# Patient Record
Sex: Female | Born: 1950 | Race: White | Hispanic: No | Smoking: Former smoker
Health system: Southern US, Community
[De-identification: ages and names within clinical notes are randomized; demographics above are authoritative.]

## PROBLEM LIST (undated history)

## (undated) DIAGNOSIS — I509 Heart failure, unspecified: Secondary | ICD-10-CM

## (undated) DIAGNOSIS — R0602 Shortness of breath: Secondary | ICD-10-CM

## (undated) DIAGNOSIS — D649 Anemia, unspecified: Secondary | ICD-10-CM

## (undated) DIAGNOSIS — I209 Angina pectoris, unspecified: Secondary | ICD-10-CM

## (undated) DIAGNOSIS — F329 Major depressive disorder, single episode, unspecified: Secondary | ICD-10-CM

## (undated) DIAGNOSIS — Z8719 Personal history of other diseases of the digestive system: Secondary | ICD-10-CM

## (undated) DIAGNOSIS — K219 Gastro-esophageal reflux disease without esophagitis: Secondary | ICD-10-CM

## (undated) DIAGNOSIS — R609 Edema, unspecified: Secondary | ICD-10-CM

## (undated) DIAGNOSIS — R42 Dizziness and giddiness: Secondary | ICD-10-CM

## (undated) DIAGNOSIS — M1990 Unspecified osteoarthritis, unspecified site: Secondary | ICD-10-CM

## (undated) DIAGNOSIS — J189 Pneumonia, unspecified organism: Secondary | ICD-10-CM

## (undated) DIAGNOSIS — F32A Depression, unspecified: Secondary | ICD-10-CM

## (undated) DIAGNOSIS — J449 Chronic obstructive pulmonary disease, unspecified: Secondary | ICD-10-CM

## (undated) DIAGNOSIS — H4450 Unspecified degenerated conditions of globe: Secondary | ICD-10-CM

## (undated) DIAGNOSIS — F419 Anxiety disorder, unspecified: Secondary | ICD-10-CM

## (undated) DIAGNOSIS — N179 Acute kidney failure, unspecified: Secondary | ICD-10-CM

## (undated) DIAGNOSIS — I10 Essential (primary) hypertension: Secondary | ICD-10-CM

## (undated) HISTORY — PX: LAPAROSCOPIC CHOLECYSTECTOMY: SUR755

## (undated) HISTORY — PX: PATELLA FRACTURE SURGERY: SHX735

## (undated) HISTORY — PX: FRACTURE SURGERY: SHX138

## (undated) HISTORY — PX: TUBAL LIGATION: SHX77

---

## 1998-04-13 ENCOUNTER — Emergency Department (HOSPITAL_COMMUNITY): Admission: EM | Admit: 1998-04-13 | Discharge: 1998-04-13 | Payer: Self-pay | Admitting: Emergency Medicine

## 1998-11-28 ENCOUNTER — Encounter: Payer: Self-pay | Admitting: *Deleted

## 1998-11-28 ENCOUNTER — Encounter: Payer: Self-pay | Admitting: General Surgery

## 1998-11-28 ENCOUNTER — Inpatient Hospital Stay (HOSPITAL_COMMUNITY): Admission: EM | Admit: 1998-11-28 | Discharge: 1998-11-30 | Payer: Self-pay | Admitting: Emergency Medicine

## 2001-06-24 ENCOUNTER — Emergency Department (HOSPITAL_COMMUNITY): Admission: EM | Admit: 2001-06-24 | Discharge: 2001-06-24 | Payer: Self-pay | Admitting: Emergency Medicine

## 2003-01-15 ENCOUNTER — Ambulatory Visit (HOSPITAL_BASED_OUTPATIENT_CLINIC_OR_DEPARTMENT_OTHER): Admission: RE | Admit: 2003-01-15 | Discharge: 2003-01-15 | Payer: Self-pay | Admitting: Orthopedic Surgery

## 2003-01-15 ENCOUNTER — Ambulatory Visit (HOSPITAL_COMMUNITY): Admission: RE | Admit: 2003-01-15 | Discharge: 2003-01-15 | Payer: Self-pay | Admitting: Orthopedic Surgery

## 2003-06-09 ENCOUNTER — Emergency Department (HOSPITAL_COMMUNITY): Admission: EM | Admit: 2003-06-09 | Discharge: 2003-06-09 | Payer: Self-pay

## 2007-06-19 ENCOUNTER — Inpatient Hospital Stay (HOSPITAL_COMMUNITY): Admission: EM | Admit: 2007-06-19 | Discharge: 2007-06-22 | Payer: Self-pay | Admitting: Emergency Medicine

## 2007-06-19 ENCOUNTER — Ambulatory Visit: Payer: Self-pay | Admitting: Internal Medicine

## 2007-06-20 ENCOUNTER — Encounter (INDEPENDENT_AMBULATORY_CARE_PROVIDER_SITE_OTHER): Payer: Self-pay | Admitting: Internal Medicine

## 2007-06-21 ENCOUNTER — Encounter (INDEPENDENT_AMBULATORY_CARE_PROVIDER_SITE_OTHER): Payer: Self-pay | Admitting: Internal Medicine

## 2007-06-21 ENCOUNTER — Ambulatory Visit: Payer: Self-pay | Admitting: Vascular Surgery

## 2010-08-01 NOTE — H&P (Signed)
NAME:  [name] [name], [name] [name]               ACCOUNT NO.:  [id number]   MEDICAL RECORD NO.:  [id number]          PATIENT TYPE:  INP   LOCATION:  1826                         FACILITY:  [facility]   PHYSICIAN:  Eduard Clos, MDDATE OF BIRTH:  12-Feb-1951   DATE OF ADMISSION:  06/19/2007  DATE OF DISCHARGE:                              HISTORY & PHYSICAL   CHIEF COMPLAINT:  Shortness of breath and chest pain.   HISTORY OF PRESENT ILLNESS:  A 60 year old female who was recently  started on Lasix by her primary care physician  following routine fluid  accumulation, personally complaining of shortness of breath and chest  pain.  The patient states that over the last 2-3 weeks, she has  complaining that her legs and extremities have been swelling  increasingly.  She had gone to her primary care physician and she was  prescribed Lasix, the dose of which exactly is not known now, despite  which she is getting increasingly edematous  as well and she decided to  come to the ER.  In addition to the shortness of breath and swelling,  she also noted that she has been getting retrosternal chest pain  pressure-like which lasts for a few seconds, nonradiating, and not  related to any exertion or any particular activity.  The patient denies  any cough or productive sputum.  Denies any dizziness, loss of  consciousness, abdominal pain, nausea, vomiting, diarrhea, fever, or  chills.   PAST MEDICAL HISTORY:  Hypertension.   PAST SURGICAL HISTORY:  Cholecystectomy   Medications  the patient was on Lasix, dose not known.   ALLERGIES:  CODEINE.   SOCIAL HISTORY:  The patient quit smoking a year ago.  Denies any  alcohol or drug abuse.   FAMILY HISTORY:  Significant for coronary artery disease in the family  per patient.   REVIEW OF SYSTEMS:  As present in the history of presenting illness,  nothing else significant.   PHYSICAL EXAMINATION:  GENERAL:  The patient examined at bedside not in  acute  distress.  She states that she feels much better now.  VITAL SIGNS:  Blood pressure 160/67, on admission it was 204/94, pulse  70, temperature 97.2, respirations 24 per minute, O2 saturations 94%.  HEENT:  Anicteric.  No pallor.  CHEST:  Bilateral air entry present.  No rhonchi no crepitation.  HEART:  S1 and S2 heard.  ABDOMEN:  Soft.  Nontender.  Bowel sounds heard.  NEUROLOGY:  Awake, alert and oriented to time, place, and person.  Both  upper limbs 5/5 Peripheral pulses felt.  No edema.   LABORATORY DATA:  Chest x-ray shows CHF pattern with right  tracheobronchial region adenopathy.  Recommending follow-up chest x-ray  or CAT scan.   EKG; normal sinus rhythm with no acute ST T wave changes.  CBC; WBC 5,  hemoglobin 13.6, hematocrit 40, platelets 160, neutrophils 55%.  Basic  metabolic panel; sodium 139, potassium 4.1, chloride 105, glucose 96,  BUN 12, creatinine 0.9.  CK-MB less than 1, troponin less than 0.05, BNP  61.  UA; trace blood, protein, WBC 0-2, bacteria  few.   ASSESSMENT:  1. Decompensated congestive heart failure.  2. Chest pain.  3. Possible urinary tract infection.  4. Obesity.  5. History of cigarette smoking, now quit.   PLAN:  Admit patient to telemetry under [facility].  We will follow  serial cardiac markers.  We will get a CAT scan of the chest with  contrast.  We will place the patient on Lasix IV and get a two-  dimensional echocardiogram.  The patient will need a cardiac consult.  Further recommendations as the patient's condition evolves.      Eduard Clos, MD  Electronically Signed     ANK/MEDQ  D:  06/19/2007  T:  06/19/2007  Job:  [id number]

## 2010-08-01 NOTE — Discharge Summary (Signed)
NAME:  [name] [name], [name] [name]               ACCOUNT NO.:  [id number]   MEDICAL RECORD NO.:  [id number]          PATIENT TYPE:  INP   LOCATION:  3739                         FACILITY:  [facility]   PHYSICIAN:  Elliot Cousin, M.D.    DATE OF BIRTH:  August 30, 1950   DATE OF ADMISSION:  06/19/2007  DATE OF DISCHARGE:  06/22/2007                               DISCHARGE SUMMARY   DISCHARGE DIAGNOSES:  1. Patchy ground-glass opacities bilaterally with bilateral hilar and      mediastinal adenopathy per CT scan of the chest on June 20, 2007.  2. Pulmonary edema versus bronchitic pneumonia.  3. Hypertension.  4. Morbid obesity  5. Hypokalemia.  6. Mild thrombocytopenia.  7. Left leg pain, venous Doppler ruled out deep venous thrombosis.   DISCHARGE MEDICATIONS:  1. Lasix 20 mg b.i.d.  2. Potassium chloride 20 mEq half a tablet b.i.d.  3. Ceftin 500 mg b.i.d. for 7 more days.  4. Azithromycin 250 mg daily for 7 more days.  5. Vicodin 5 mg every 4 hours as needed for pain.  6. Albuterol inhaler 2 puffs every 4-6 hours as needed.  7. Aspirin 81 mg daily.  8. Advair discus 250/50 one puff b.i.d.  9. Multivitamin once daily.   DISCHARGE DISPOSITION:  The patient is being discharged to home in  improved and stable condition.  She was advised to follow up with her  primary care physician in 5-7 days.   CONSULTATIONS:  None.   PROCEDURES PERFORMED:  1. Left lower extremity venous Doppler study.  The results revealed no      evidence of DVT, SVT, or Baker cyst.  2. CT scan of the chest on June 20, 2007.  The results revealed no      embolus identified.  Bilateral ground-glass opacities, most      compatible with edema.  Abnormal bilateral hilar and mediastinal      adenopathy along with some periportal adenopathy.  3. 2D echocardiogram.   HISTORY OF PRESENT ILLNESS:  The patient is a 60 year old woman with a  past medical history significant for hypertension, questionable asthma,  and morbid obesity.   She presented to the emergency department on June 19, 2007 with a chief complaint of shortness of breath and chest  pressure.  When she was evaluated in the emergency department, the chest  x-ray revealed congestive heart failure per the radiologist's  interpretation.  Her EKG revealed normal sinus rhythm with no ST or T-  wave abnormalities.  She was given 80 mg of Lasix IV by the emergency  department physician.  The patient was therefore admitted for further  evaluation and management.   For additional details, please see the dictated history and physical.   HOSPITAL COURSE:  1. BILATERAL GROUND-GLASS OPACITIES AND MEDIASTINAL/HILAR ADENOPATHY.      As indicated above, the patient's EKG revealed a normal sinus      rhythm with no acute ST or T-wave abnormalities.  The chest x-ray      was read as congestive heart failure.  The patient was given 80 mg  of Lasix by the emergency department physician.  She was continued      on diuretic therapy with Lasix 40 mg IV daily and nitroglycerin      paste was added.  The patient was hypertensive on arrival to the      emergency department with a blood pressure of 204/94.  She was      therefore started on clonidine.  For further evaluation, a CT scan      of the chest and a 2D echocardiogram were ordered.  The CT scan of      the chest revealed bilateral opacities with mediastinal and hilar      adenopathy.  The differential diagnoses included edema, alveolitis,      sarcoidosis, or malignancy.  There was also a clinical suspicion      for bronchitic pneumonia and therefore the patient was started on      treatment with Rocephin and azithromycin.  The results of the 2D      echocardiogram revealed an ejection fraction of 60-70%.  There      appeared to be no gross abnormalities of her valves and no obvious      evidence of moderate-to-severe valvular regurgitation or stenosis.      There was no mention of diastolic dysfunction per the       interpretation by Dr. Tenny Craw, however, there is a clinical suspicion      for possible diastolic dysfunction. The patient's BNP was 61 and      her TSH was 3.04.   Over the course of the hospitalization, the patient diuresed briskly.  The Lasix was changed to 20 mg p.o. b.i.d. after 2 days.  Today, she is  significantly symptomatically improved.  She is afebrile and her white  blood cell count is within normal limits at 5.4.  A pulmonary  consultation was not obtained during the hospital course as the patient  became asymptomatic rather quickly.  However, I informed the patient  that she should continue on antibiotic treatment for at least one more  week and she should be reevaluated with a CT scan of the chest in 4-6  weeks.  This will be deferred to her primary care physician, Dr. Purnell Shoemaker.  The patient was strongly advised to discuss the ordering of the followup  CT scan with Dr. Purnell Shoemaker.  In addition, the patient will be discharged on  albuterol inhaler therapy as needed and she was advised to continue  Advair Diskus.  She had been treated with Lasix 20 mg daily prior to the  hospitalization, however, she will be discharged to home on 20 mg  b.i.d..  The patient was informed of this and she voiced understanding.   1. HYPERTENSION.  The patient has a history of hypertension, however,      she apparently had not been treated with an antihypertensive      medication lately.  She did mention that her blood pressure is      usually normal during her doctor's visits.  When she arrived to the      emergency department, her systolic blood pressure was greater than      200.  She was started on clonidine, however, after her blood      pressure fell dramatically, the clonidine was discontinued.  She      was, however, started on lisinopril at 2.5 mg daily in anticipation      that the patient had left ventricular systolic dysfunction.  However, once her blood pressure normalized and there was  no      evidence of LV dysfunction on the echo, the lisinopril was      discontinued.  Another reason the lisinopril was discontinued was      that the patient developed a dry hacking cough after it was      started.  She was advised to have her blood pressure rechecked by      her primary care physician next week and if needed, he could      restart another antihypertensive medication.  The patient voiced      understanding.  As of today, her blood pressure is 116/78.   1. HYPOKALEMIA.  The patient's serum potassium fell to a nadir of 3.3.      This was attributable to the Lasix.  She was repleted with      potassium chloride during hospital course and as of today, her      serum potassium is 4.4.   1. MORBID OBESITY.  The registered dietician was counseled and she      provided the patient with weight management and nutrition      recommendations for weight loss.   1. THROMBOCYTOPENIA.  The patient was started on prophylactic Lovenox.      Her platelet count was 160 at the time of the initial hospital      assessment.  However, it did fall to a nadir of 127.  The Lovenox      was discontinued.  Following the discontinuation of the Lovenox,      her platelet count improved to 145 prior to hospital discharge.   1. LEFT LOWER EXTREMITY CALF PAIN.  The patient complained of left      lower extremity pain when ambulating, although she says that she      has had intermittent pain in her hips and her legs for quite some      time.  Given her morbid obesity, the patient underwent further      evaluation with the left lower extremity venous Doppler study.  The      Doppler study was negative for DVT.      Elliot Cousin, M.D.  Electronically Signed     DF/MEDQ  D:  06/22/2007  T:  06/23/2007  Job:  [id number]   cc:   Lianne Bushy, M.D.

## 2010-08-04 NOTE — Op Note (Signed)
NAME:  [name] [name], [name] [name]                           ACCOUNT NO.:  [id number]   MEDICAL RECORD NO.:  [id number]                   PATIENT TYPE:  AMB   LOCATION:  DSC                                  FACILITY:  [facility]   PHYSICIAN:  Harvie Junior, M.D.                DATE OF BIRTH:  May 29, 1950   DATE OF PROCEDURE:  01/15/2003  DATE OF DISCHARGE:                                 OPERATIVE REPORT   PREOPERATIVE DIAGNOSIS:  Subcutaneous hematoma with bursal inflammation  prepatellar.   POSTOPERATIVE DIAGNOSIS:  Subcutaneous hematoma with bursal inflammation  prepatellar.   PROCEDURE:  1. Debridement of deep hematoma of the anterior leg.  2. Prepatellar bursal excision.   SURGEON:  Harvie Junior, M.D.   ANESTHESIA:  General.   INDICATIONS FOR PROCEDURE:  She is a 60 year old female with a long history  of having had some sort  of  subcutaneous bleed in the right leg. She was  seen in the office where she underwent  aspiration x4. She underwent a small  incision with placement of a drain. None of this seemed to keep the knee  from draining. An MRI was obtained which showed no intraarticular knee  pathology and showed a subcutaneous hematoma collection suspected to be in  the prepatellar bursal area. She is brought to the operating room for  debridement of this area.   DESCRIPTION OF PROCEDURE:  The patient was brought to the operating room and  after adequate anesthesia was obtained with general anesthesia the patient  was placed supine on the operating table. The right leg was then prepped and  draped in the usual sterile fashion. Following this the leg was  exsanguinated and the blood pressure tourniquet was inflated to 350 mmHg.   Following  this a linear incision was made and the subcutaneous tissue was  dissected down to the level of the hematoma which was clearly encountered,  and a large amount of blood tinged fluid came through this area. Following  this the cavity was opened  completely and the walls of the cavity were  debrided and scraped. A rongeur was used. The layers of this area were  debrided fully.   The prepatellar bursa was then identified  and it was noted it also looked  to be involved. A prepatellar bursectomy was undertaken at this point.  Dissection was continued both medially and laterally to make sure we had  gotten the extent of the hematoma.   At this point the pulse lavage irrigator was used to mechanically debride  the area more fully. The tourniquet was let down to make sure there were no  obvious areas of bleeding. Seeing none a 10 mm Blake drain was placed  through an inferior portal and the dead space was closed with subcutaneous  type stitches allowing the Vicryl stitching the skin  to the deep area. Once  this was accomplished  the central area was closed with deep stitches which  sewed the 2 edges to the deep layer.   Following  this the skin was stapled and a sterile compressive dressing was  applied. The Blake drain was sewn in place inferiorly. At this point Ace  wraps were placed over the dressing  and the patient was placed in a knee  immobilizer.   She was taken to the recovery room where she was noted to be in satisfactory  condition.  Estimated blood loss was for the procedure was 50 mL.                                                 Harvie Junior, M.D.    Ranae Plumber  D:  01/15/2003  T:  01/15/2003  Job:  [id number]

## 2010-10-20 ENCOUNTER — Emergency Department (HOSPITAL_COMMUNITY)
Admission: EM | Admit: 2010-10-20 | Discharge: 2010-10-20 | Disposition: A | Discharge status: Home / Self Care | Payer: Medicare Other | Attending: Emergency Medicine | Admitting: Emergency Medicine

## 2010-10-20 ENCOUNTER — Emergency Department (HOSPITAL_COMMUNITY): Payer: Medicare Other

## 2010-10-20 DIAGNOSIS — R109 Unspecified abdominal pain: Secondary | ICD-10-CM | POA: Insufficient documentation

## 2010-10-20 DIAGNOSIS — S32009A Unspecified fracture of unspecified lumbar vertebra, initial encounter for closed fracture: Secondary | ICD-10-CM | POA: Insufficient documentation

## 2010-10-20 DIAGNOSIS — X58XXXA Exposure to other specified factors, initial encounter: Secondary | ICD-10-CM | POA: Insufficient documentation

## 2010-10-20 LAB — COMPREHENSIVE METABOLIC PANEL
AST: 20 U/L (ref 0–37)
Albumin: 3 g/dL — ABNORMAL LOW (ref 3.5–5.2)
Alkaline Phosphatase: 81 U/L (ref 39–117)
BUN: 19 mg/dL (ref 6–23)
Chloride: 100 mEq/L (ref 96–112)
Potassium: 4.2 mEq/L (ref 3.5–5.1)
Sodium: 132 mEq/L — ABNORMAL LOW (ref 135–145)
Total Bilirubin: 0.5 mg/dL (ref 0.3–1.2)
Total Protein: 7.9 g/dL (ref 6.0–8.3)

## 2010-10-20 LAB — DIFFERENTIAL
Basophils Absolute: 0 10*3/uL (ref 0.0–0.1)
Basophils Relative: 1 % (ref 0–1)
Eosinophils Absolute: 0.1 10*3/uL (ref 0.0–0.7)
Eosinophils Relative: 2 % (ref 0–5)
Lymphocytes Relative: 25 % (ref 12–46)
Monocytes Absolute: 0.6 10*3/uL (ref 0.1–1.0)

## 2010-10-20 LAB — CBC
MCHC: 34.6 g/dL (ref 30.0–36.0)
Platelets: 133 10*3/uL — ABNORMAL LOW (ref 150–400)
RDW: 13.1 % (ref 11.5–15.5)
WBC: 4.6 10*3/uL (ref 4.0–10.5)

## 2010-10-20 LAB — URINALYSIS, ROUTINE W REFLEX MICROSCOPIC
Bilirubin Urine: NEGATIVE
Glucose, UA: NEGATIVE mg/dL
Leukocytes, UA: NEGATIVE
Nitrite: NEGATIVE
Specific Gravity, Urine: 1.017 (ref 1.005–1.030)
pH: 5.5 (ref 5.0–8.0)

## 2010-10-20 LAB — LIPASE, BLOOD: Lipase: 16 U/L (ref 11–59)

## 2010-10-20 MED ORDER — IOHEXOL 300 MG/ML  SOLN
100.0000 mL | Freq: Once | INTRAMUSCULAR | Status: AC | PRN
  Administered 2010-10-20: 100 mL via INTRAVENOUS

## 2010-12-12 LAB — BASIC METABOLIC PANEL
BUN: 16
CO2: 29
Chloride: 100
Creatinine, Ser: 1.02
GFR calc non Af Amer: >60
GFR calc non Af Amer: >60
Glucose, Bld: 108 — ABNORMAL HIGH
Glucose, Bld: 94
Potassium: 3.3 — ABNORMAL LOW
Potassium: 4.4
Sodium: 134 — ABNORMAL LOW
Sodium: 137

## 2010-12-12 LAB — POCT I-STAT, CHEM 8
BUN: 12
Chloride: 105
Creatinine, Ser: 0.9
Glucose, Bld: 96
HCT: 40
Potassium: 4.1

## 2010-12-12 LAB — URINALYSIS, ROUTINE W REFLEX MICROSCOPIC
Bilirubin Urine: NEGATIVE
Glucose, UA: NEGATIVE
Ketones, ur: NEGATIVE
Nitrite: NEGATIVE
Protein, ur: 30 — AB
Specific Gravity, Urine: 1.021
Urobilinogen, UA: 1
pH: 5.5

## 2010-12-12 LAB — POCT CARDIAC MARKERS
CKMB, poc: <1 — ABNORMAL LOW
CKMB, poc: <1 — ABNORMAL LOW
Myoglobin, poc: 49.2
Operator id: 234501
Troponin i, poc: <0.05

## 2010-12-12 LAB — URINALYSIS, MICROSCOPIC ONLY
Glucose, UA: NEGATIVE
Protein, ur: NEGATIVE
Specific Gravity, Urine: 1.014
pH: 5

## 2010-12-12 LAB — LIPID PANEL
LDL Cholesterol: 102 — ABNORMAL HIGH
Triglycerides: 78
VLDL: 16

## 2010-12-12 LAB — DIFFERENTIAL
Lymphocytes Relative: 23
Lymphs Abs: 1.1
Monocytes Absolute: 0.5
Monocytes Relative: 10
Neutro Abs: 3.2

## 2010-12-12 LAB — CBC
HCT: 37.9
HCT: 38
Hemoglobin: 13
Hemoglobin: 13.4
MCHC: 34.3
MCV: 93
Platelets: 127 — ABNORMAL LOW
RBC: 4.16
RDW: 13.4
RDW: 13.4

## 2010-12-12 LAB — URINE CULTURE: Culture: NO GROWTH

## 2010-12-12 LAB — TSH: TSH: 3.047

## 2010-12-12 LAB — URINE MICROSCOPIC-ADD ON

## 2010-12-12 LAB — CK TOTAL AND CKMB (NOT AT ARMC): CK, MB: 0.5

## 2011-03-29 DIAGNOSIS — M545 Low back pain: Secondary | ICD-10-CM | POA: Diagnosis not present

## 2011-03-29 DIAGNOSIS — G4700 Insomnia, unspecified: Secondary | ICD-10-CM | POA: Diagnosis not present

## 2011-03-29 DIAGNOSIS — R112 Nausea with vomiting, unspecified: Secondary | ICD-10-CM | POA: Diagnosis not present

## 2011-04-16 DIAGNOSIS — H109 Unspecified conjunctivitis: Secondary | ICD-10-CM | POA: Diagnosis not present

## 2011-04-16 DIAGNOSIS — M79609 Pain in unspecified limb: Secondary | ICD-10-CM | POA: Diagnosis not present

## 2011-04-16 DIAGNOSIS — G4700 Insomnia, unspecified: Secondary | ICD-10-CM | POA: Diagnosis not present

## 2011-05-14 DIAGNOSIS — J189 Pneumonia, unspecified organism: Secondary | ICD-10-CM | POA: Diagnosis not present

## 2011-05-14 DIAGNOSIS — M545 Low back pain: Secondary | ICD-10-CM | POA: Diagnosis not present

## 2011-05-14 DIAGNOSIS — E7800 Pure hypercholesterolemia, unspecified: Secondary | ICD-10-CM | POA: Diagnosis not present

## 2011-05-14 DIAGNOSIS — E039 Hypothyroidism, unspecified: Secondary | ICD-10-CM | POA: Diagnosis not present

## 2011-05-14 DIAGNOSIS — R5381 Other malaise: Secondary | ICD-10-CM | POA: Diagnosis not present

## 2011-05-14 DIAGNOSIS — Z5181 Encounter for therapeutic drug level monitoring: Secondary | ICD-10-CM | POA: Diagnosis not present

## 2011-05-14 DIAGNOSIS — R112 Nausea with vomiting, unspecified: Secondary | ICD-10-CM | POA: Diagnosis not present

## 2011-05-14 DIAGNOSIS — Z79899 Other long term (current) drug therapy: Secondary | ICD-10-CM | POA: Diagnosis not present

## 2011-05-21 DIAGNOSIS — J029 Acute pharyngitis, unspecified: Secondary | ICD-10-CM | POA: Diagnosis not present

## 2011-05-21 DIAGNOSIS — G4700 Insomnia, unspecified: Secondary | ICD-10-CM | POA: Diagnosis not present

## 2011-05-21 DIAGNOSIS — B379 Candidiasis, unspecified: Secondary | ICD-10-CM | POA: Diagnosis not present

## 2011-06-13 DIAGNOSIS — F411 Generalized anxiety disorder: Secondary | ICD-10-CM | POA: Diagnosis not present

## 2011-06-13 DIAGNOSIS — G4700 Insomnia, unspecified: Secondary | ICD-10-CM | POA: Diagnosis not present

## 2011-06-13 DIAGNOSIS — J449 Chronic obstructive pulmonary disease, unspecified: Secondary | ICD-10-CM | POA: Diagnosis not present

## 2011-06-14 DIAGNOSIS — G4700 Insomnia, unspecified: Secondary | ICD-10-CM | POA: Diagnosis not present

## 2011-06-14 DIAGNOSIS — G4730 Sleep apnea, unspecified: Secondary | ICD-10-CM | POA: Diagnosis not present

## 2011-06-20 DIAGNOSIS — R5383 Other fatigue: Secondary | ICD-10-CM | POA: Diagnosis not present

## 2011-06-20 DIAGNOSIS — M545 Low back pain: Secondary | ICD-10-CM | POA: Diagnosis not present

## 2011-06-20 DIAGNOSIS — R112 Nausea with vomiting, unspecified: Secondary | ICD-10-CM | POA: Diagnosis not present

## 2011-06-20 DIAGNOSIS — R5381 Other malaise: Secondary | ICD-10-CM | POA: Diagnosis not present

## 2011-07-12 DIAGNOSIS — G4700 Insomnia, unspecified: Secondary | ICD-10-CM | POA: Diagnosis not present

## 2011-07-12 DIAGNOSIS — E7800 Pure hypercholesterolemia, unspecified: Secondary | ICD-10-CM | POA: Diagnosis not present

## 2011-07-12 DIAGNOSIS — I509 Heart failure, unspecified: Secondary | ICD-10-CM | POA: Diagnosis not present

## 2011-07-12 DIAGNOSIS — R609 Edema, unspecified: Secondary | ICD-10-CM | POA: Diagnosis not present

## 2011-07-16 DIAGNOSIS — N189 Chronic kidney disease, unspecified: Secondary | ICD-10-CM | POA: Diagnosis not present

## 2011-07-16 DIAGNOSIS — R609 Edema, unspecified: Secondary | ICD-10-CM | POA: Diagnosis not present

## 2011-07-16 DIAGNOSIS — G4700 Insomnia, unspecified: Secondary | ICD-10-CM | POA: Diagnosis not present

## 2011-08-01 DIAGNOSIS — I10 Essential (primary) hypertension: Secondary | ICD-10-CM | POA: Diagnosis not present

## 2011-08-01 DIAGNOSIS — R0602 Shortness of breath: Secondary | ICD-10-CM | POA: Diagnosis not present

## 2011-08-01 DIAGNOSIS — K219 Gastro-esophageal reflux disease without esophagitis: Secondary | ICD-10-CM | POA: Diagnosis not present

## 2011-08-01 DIAGNOSIS — I509 Heart failure, unspecified: Secondary | ICD-10-CM | POA: Diagnosis not present

## 2011-08-02 DIAGNOSIS — I501 Left ventricular failure: Secondary | ICD-10-CM | POA: Diagnosis not present

## 2011-08-15 ENCOUNTER — Other Ambulatory Visit: Payer: Self-pay | Admitting: Nephrology

## 2011-08-15 DIAGNOSIS — N189 Chronic kidney disease, unspecified: Secondary | ICD-10-CM

## 2011-08-16 ENCOUNTER — Inpatient Hospital Stay (HOSPITAL_COMMUNITY)
Admission: EM | Admit: 2011-08-16 | Discharge: 2011-08-21 | DRG: 683 | Disposition: A | Discharge status: Home / Self Care | Payer: Medicare Other | Attending: Family Medicine | Admitting: Family Medicine

## 2011-08-16 ENCOUNTER — Ambulatory Visit
Admission: RE | Admit: 2011-08-16 | Discharge: 2011-08-16 | Disposition: A | Discharge status: Home / Self Care | Payer: Medicare Other | Source: Ambulatory Visit | Attending: Nephrology | Admitting: Nephrology

## 2011-08-16 ENCOUNTER — Encounter (HOSPITAL_COMMUNITY): Payer: Self-pay

## 2011-08-16 ENCOUNTER — Emergency Department (HOSPITAL_COMMUNITY): Payer: Medicare Other

## 2011-08-16 DIAGNOSIS — N183 Chronic kidney disease, stage 3 unspecified: Secondary | ICD-10-CM | POA: Diagnosis not present

## 2011-08-16 DIAGNOSIS — D649 Anemia, unspecified: Secondary | ICD-10-CM | POA: Diagnosis present

## 2011-08-16 DIAGNOSIS — R82998 Other abnormal findings in urine: Secondary | ICD-10-CM | POA: Diagnosis not present

## 2011-08-16 DIAGNOSIS — J811 Chronic pulmonary edema: Secondary | ICD-10-CM | POA: Diagnosis not present

## 2011-08-16 DIAGNOSIS — I129 Hypertensive chronic kidney disease with stage 1 through stage 4 chronic kidney disease, or unspecified chronic kidney disease: Secondary | ICD-10-CM | POA: Diagnosis present

## 2011-08-16 DIAGNOSIS — M79609 Pain in unspecified limb: Secondary | ICD-10-CM | POA: Diagnosis not present

## 2011-08-16 DIAGNOSIS — B3781 Candidal esophagitis: Secondary | ICD-10-CM | POA: Diagnosis not present

## 2011-08-16 DIAGNOSIS — R0902 Hypoxemia: Secondary | ICD-10-CM | POA: Diagnosis not present

## 2011-08-16 DIAGNOSIS — Z79899 Other long term (current) drug therapy: Secondary | ICD-10-CM | POA: Diagnosis not present

## 2011-08-16 DIAGNOSIS — N179 Acute kidney failure, unspecified: Principal | ICD-10-CM | POA: Diagnosis present

## 2011-08-16 DIAGNOSIS — J411 Mucopurulent chronic bronchitis: Secondary | ICD-10-CM | POA: Diagnosis not present

## 2011-08-16 DIAGNOSIS — J9601 Acute respiratory failure with hypoxia: Secondary | ICD-10-CM

## 2011-08-16 DIAGNOSIS — E875 Hyperkalemia: Secondary | ICD-10-CM | POA: Diagnosis present

## 2011-08-16 DIAGNOSIS — D696 Thrombocytopenia, unspecified: Secondary | ICD-10-CM | POA: Diagnosis present

## 2011-08-16 DIAGNOSIS — J8410 Pulmonary fibrosis, unspecified: Secondary | ICD-10-CM | POA: Diagnosis present

## 2011-08-16 DIAGNOSIS — R1310 Dysphagia, unspecified: Secondary | ICD-10-CM | POA: Diagnosis present

## 2011-08-16 DIAGNOSIS — J440 Chronic obstructive pulmonary disease with acute lower respiratory infection: Secondary | ICD-10-CM | POA: Diagnosis not present

## 2011-08-16 DIAGNOSIS — Z87891 Personal history of nicotine dependence: Secondary | ICD-10-CM | POA: Diagnosis not present

## 2011-08-16 DIAGNOSIS — R9389 Abnormal findings on diagnostic imaging of other specified body structures: Secondary | ICD-10-CM | POA: Diagnosis not present

## 2011-08-16 DIAGNOSIS — J84113 Idiopathic non-specific interstitial pneumonitis: Secondary | ICD-10-CM | POA: Diagnosis not present

## 2011-08-16 DIAGNOSIS — J984 Other disorders of lung: Secondary | ICD-10-CM | POA: Diagnosis not present

## 2011-08-16 DIAGNOSIS — J9600 Acute respiratory failure, unspecified whether with hypoxia or hypercapnia: Secondary | ICD-10-CM | POA: Diagnosis not present

## 2011-08-16 DIAGNOSIS — R0602 Shortness of breath: Secondary | ICD-10-CM | POA: Diagnosis not present

## 2011-08-16 DIAGNOSIS — R0989 Other specified symptoms and signs involving the circulatory and respiratory systems: Secondary | ICD-10-CM | POA: Diagnosis not present

## 2011-08-16 DIAGNOSIS — R918 Other nonspecific abnormal finding of lung field: Secondary | ICD-10-CM | POA: Diagnosis not present

## 2011-08-16 DIAGNOSIS — R634 Abnormal weight loss: Secondary | ICD-10-CM | POA: Diagnosis present

## 2011-08-16 DIAGNOSIS — F411 Generalized anxiety disorder: Secondary | ICD-10-CM | POA: Diagnosis not present

## 2011-08-16 DIAGNOSIS — R5381 Other malaise: Secondary | ICD-10-CM | POA: Diagnosis not present

## 2011-08-16 DIAGNOSIS — N189 Chronic kidney disease, unspecified: Secondary | ICD-10-CM

## 2011-08-16 DIAGNOSIS — I10 Essential (primary) hypertension: Secondary | ICD-10-CM | POA: Diagnosis not present

## 2011-08-16 DIAGNOSIS — J209 Acute bronchitis, unspecified: Secondary | ICD-10-CM | POA: Diagnosis not present

## 2011-08-16 DIAGNOSIS — E440 Moderate protein-calorie malnutrition: Secondary | ICD-10-CM | POA: Diagnosis present

## 2011-08-16 DIAGNOSIS — R5383 Other fatigue: Secondary | ICD-10-CM | POA: Diagnosis not present

## 2011-08-16 DIAGNOSIS — R748 Abnormal levels of other serum enzymes: Secondary | ICD-10-CM | POA: Diagnosis not present

## 2011-08-16 DIAGNOSIS — R599 Enlarged lymph nodes, unspecified: Secondary | ICD-10-CM | POA: Diagnosis not present

## 2011-08-16 DIAGNOSIS — I359 Nonrheumatic aortic valve disorder, unspecified: Secondary | ICD-10-CM | POA: Diagnosis not present

## 2011-08-16 DIAGNOSIS — R05 Cough: Secondary | ICD-10-CM | POA: Diagnosis not present

## 2011-08-16 HISTORY — DX: Chronic obstructive pulmonary disease, unspecified: J44.9

## 2011-08-16 HISTORY — DX: Unspecified degenerated conditions of globe: H44.50

## 2011-08-16 HISTORY — DX: Acute kidney failure, unspecified: N17.9

## 2011-08-16 HISTORY — DX: Shortness of breath: R06.02

## 2011-08-16 HISTORY — DX: Edema, unspecified: R60.9

## 2011-08-16 HISTORY — DX: Angina pectoris, unspecified: I20.9

## 2011-08-16 HISTORY — DX: Dizziness and giddiness: R42

## 2011-08-16 HISTORY — DX: Anxiety disorder, unspecified: F41.9

## 2011-08-16 LAB — CBC
Platelets: 124 10*3/uL — ABNORMAL LOW (ref 150–400)
RDW: 13.5 % (ref 11.5–15.5)
WBC: 4.4 10*3/uL (ref 4.0–10.5)

## 2011-08-16 LAB — COMPREHENSIVE METABOLIC PANEL
ALT: 23 U/L (ref 0–35)
AST: 26 U/L (ref 0–37)
Albumin: 3 g/dL — ABNORMAL LOW (ref 3.5–5.2)
CO2: 22 mEq/L (ref 19–32)
Calcium: 9 mg/dL (ref 8.4–10.5)
Chloride: 101 mEq/L (ref 96–112)
GFR calc non Af Amer: 9 mL/min — ABNORMAL LOW (ref >90)
Sodium: 135 mEq/L (ref 135–145)
Total Bilirubin: 0.4 mg/dL (ref 0.3–1.2)

## 2011-08-16 LAB — DIFFERENTIAL
Basophils Absolute: 0 10*3/uL (ref 0.0–0.1)
Basophils Relative: 1 % (ref 0–1)
Lymphocytes Relative: 24 % (ref 12–46)
Neutro Abs: 2.5 10*3/uL (ref 1.7–7.7)
Neutrophils Relative %: 58 % (ref 43–77)

## 2011-08-16 LAB — URINALYSIS, ROUTINE W REFLEX MICROSCOPIC
Bilirubin Urine: NEGATIVE
Glucose, UA: NEGATIVE mg/dL
Glucose, UA: NEGATIVE mg/dL
Hgb urine dipstick: NEGATIVE
Hgb urine dipstick: NEGATIVE
Specific Gravity, Urine: 1.017 (ref 1.005–1.030)
Specific Gravity, Urine: 1.013 (ref 1.005–1.030)
Urobilinogen, UA: 0.2 mg/dL (ref 0.0–1.0)
Urobilinogen, UA: 0.2 mg/dL (ref 0.0–1.0)
pH: 5 (ref 5.0–8.0)

## 2011-08-16 LAB — PROTIME-INR
INR: 1.01 (ref 0.00–1.49)
Prothrombin Time: 13.5 seconds (ref 11.6–15.2)

## 2011-08-16 LAB — URINE MICROSCOPIC-ADD ON

## 2011-08-16 LAB — MAGNESIUM: Magnesium: 2.1 mg/dL (ref 1.5–2.5)

## 2011-08-16 MED ORDER — SENNA 8.6 MG PO TABS
1.0000 | ORAL_TABLET | Freq: Every day | ORAL | Status: DC | PRN
  Filled 2011-08-16: qty 1

## 2011-08-16 MED ORDER — PROMETHAZINE HCL 25 MG PO TABS: 25.0000 mg | ORAL_TABLET | Freq: Four times a day (QID) | ORAL | Status: DC | PRN

## 2011-08-16 MED ORDER — ONDANSETRON 8 MG PO TBDP
8.0000 mg | ORAL_TABLET | Freq: Three times a day (TID) | ORAL | Status: DC | PRN
  Filled 2011-08-16: qty 1

## 2011-08-16 MED ORDER — HYDROCODONE-ACETAMINOPHEN 10-325 MG PO TABS: 1.0000 | ORAL_TABLET | Freq: Four times a day (QID) | ORAL | Status: DC | PRN

## 2011-08-16 MED ORDER — SODIUM CHLORIDE 0.9 % IJ SOLN
3.0000 mL | Freq: Two times a day (BID) | INTRAMUSCULAR | Status: DC
  Administered 2011-08-16 – 2011-08-21 (×9): 3 mL via INTRAVENOUS

## 2011-08-16 MED ORDER — POLYETHYLENE GLYCOL 3350 17 G PO PACK
17.0000 g | PACK | Freq: Every day | ORAL | Status: DC | PRN
  Filled 2011-08-16: qty 1

## 2011-08-16 MED ORDER — ACETAMINOPHEN 325 MG PO TABS: 650.0000 mg | ORAL_TABLET | Freq: Four times a day (QID) | ORAL | Status: DC | PRN

## 2011-08-16 MED ORDER — SODIUM POLYSTYRENE SULFONATE 15 GM/60ML PO SUSP
15.0000 g | Freq: Once | ORAL | Status: AC
  Administered 2011-08-16: 15 g via ORAL
  Filled 2011-08-16: qty 60

## 2011-08-16 MED ORDER — ACETAMINOPHEN 650 MG RE SUPP: 650.0000 mg | Freq: Four times a day (QID) | RECTAL | Status: DC | PRN

## 2011-08-16 MED ORDER — CALCIUM CARBONATE ANTACID 500 MG PO CHEW
2.0000 | CHEWABLE_TABLET | Freq: Three times a day (TID) | ORAL | Status: DC | PRN
  Administered 2011-08-16 – 2011-08-17 (×3): 400 mg via ORAL
  Filled 2011-08-16 (×3): qty 2

## 2011-08-16 MED ORDER — ASPIRIN EC 81 MG PO TBEC
81.0000 mg | DELAYED_RELEASE_TABLET | Freq: Every day | ORAL | Status: DC
  Administered 2011-08-17 – 2011-08-21 (×5): 81 mg via ORAL
  Filled 2011-08-16 (×5): qty 1

## 2011-08-16 MED ORDER — FLUOXETINE HCL 20 MG PO CAPS
20.0000 mg | ORAL_CAPSULE | Freq: Every day | ORAL | Status: DC
  Administered 2011-08-17 – 2011-08-21 (×5): 20 mg via ORAL
  Filled 2011-08-16 (×5): qty 1

## 2011-08-16 MED ORDER — ALBUTEROL SULFATE HFA 108 (90 BASE) MCG/ACT IN AERS
2.0000 | INHALATION_SPRAY | Freq: Four times a day (QID) | RESPIRATORY_TRACT | Status: DC | PRN
  Filled 2011-08-16: qty 6.7

## 2011-08-16 NOTE — ED Notes (Signed)
Dr. Briant Cedar at bedside explaining to patient plan for treatment. Patient is to be admitted and repeat blood work in the am per MD.  Awaiting admission assignment.

## 2011-08-16 NOTE — H&P (Signed)
Family Medicine Teaching [facility] Admission History and Physical  Patient name: [name] [name] Medical record number: [id number] Date of birth: March 09, 1951 Age: 61 y.o. Gender: female  Primary Care Provider: Dr. Caroline More  Chief Complaint: Elevated Cr, weakness, and no urine  History of Present Illness: [name] [name] is a 61 y.o. year old female presenting with 2-46month h/o increasing weakenss and fatigue, minimal urinary output for 2 days w/ elevated Cr  General weakness: Pt reports increasing general weakness and fatigue over the past 2-61months. Pt unable to stand for greater than 5 min w/o having feeling of complete weakness "wash" over her body. Pt reports having a night or two of poor sleep followed by several days of needing to sleep "a lot."  And sleeping more in general over past 2-72mo. Reports feeling very cold and hair loss over this time period as well. Also reports being constipated, though this is not a new problem for the pt. Pt w/ some near falls that were due to weakness, no loss of consciousness. Denies Palpitations, Ha, nausea, vomiting, diarrhea, syncope, change in vision, SOB, CP. Pt w/ reported 90lb wt loss over the past 18 mo that was unintentional. Pt reports having very little taste for food for several years, though her sense of smell has remained sharp.   Elevated Cr, low Urine: Pt reports being followed regularly by [facility] but unsure of why. Pt reports being called today by renal docs who said that her Cr has increased from 3 two days ago to 4.75 today so she needed to come to the hospital. Pt reports only dribbling out some urine over the past 2 days. Denies any hematuria, dysuria, lower abdominal pain. No previous renal concerns per pt.  Pt reports very little PO over the past 2 days though she drinks 4-5 water bottles daily (approximately 2-2.5L). No recent medicine, lifestyle, dietary changes. No recent illnesses.  Review Of Systems: Per HPI  There  is no problem list on file for this patient.  Past Medical History: Past Medical History  Diagnosis Date  . Anxiety   . COPD (chronic obstructive pulmonary disease)   . Fluid retention     Past Surgical History: Past Surgical History  Procedure Date  . Cholecystectomy   . Knee surgery     Social History: History   Social History  . Marital Status: Married    Spouse Name: N/A    Number of Children: N/A  . Years of Education: N/A   Social History Main Topics  . Smoking status: Never Smoker   . Smokeless tobacco: Current User  . Alcohol Use: No  . Drug Use: No  . Sexually Active:    Other Topics Concern  . None   Social History Narrative  . None    Family History: No family history on file.  Allergies: NKDA  No current facility-administered medications for this encounter.   Current Outpatient Prescriptions  Medication Sig Dispense Refill  . albuterol (PROVENTIL HFA;VENTOLIN HFA) 108 (90 BASE) MCG/ACT inhaler Inhale 2 puffs into the lungs every 6 (six) hours as needed. For shortness of breath      . citalopram (CELEXA) 20 MG tablet Take 20 mg by mouth daily.      Marland Kitchen FLUoxetine (PROZAC) 20 MG capsule Take 20 mg by mouth daily.      . furosemide (LASIX) 40 MG tablet Take 40 mg by mouth daily.      Marland Kitchen HYDROcodone-acetaminophen (NORCO) 10-325 MG per tablet Take 1 tablet by  mouth every 6 (six) hours as needed. For pain      . promethazine (PHENERGAN) 25 MG tablet Take 25 mg by mouth every 6 (six) hours as needed. For nausea         Physical Exam: Filed Vitals:   08/16/11 1419  BP: 121/49  Pulse: 101  Temp: 98.4 F (36.9 C)  Resp: 22   General: alert, cooperative, appears stated age, mild distress and moderately obese HEENT: PERRLA, extra ocular movement intact, sclera clear, anicteric, oropharynx clear, no lesions, neck supple with midline trachea, thyroid without masses and trachea midline Heart: RRR, no m/r/g, No JVD Lungs: CTAB, normal effort Abdomen:  NABS non-painful to palpation. No masses Extremities: 2+ LE edema, 2+ distal pulses Musculoskeletal: Normal ROM Neurology: Normal finger to nose. Strength 2+ and symmetrical in upper and lower extremities. CN grossly intact  Labs and Imaging: CBC     Status: Abnormal   Collection Time   08/16/11  4:30 PM      Component Value Range   WBC 4.4  4.0 - 10.5 (K/uL)   RBC 3.38 (*) 3.87 - 5.11 (MIL/uL)   Hemoglobin 10.6 (*) 12.0 - 15.0 (g/dL)   HCT 16.1 (*) 09.6 - 46.0 (%)   MCV 95.3  78.0 - 100.0 (fL)   MCH 31.4  26.0 - 34.0 (pg)   MCHC 32.9  30.0 - 36.0 (g/dL)   RDW 04.5  40.9 - 81.1 (%)   Platelets 124 (*) 150 - 400 (K/uL)  DIFFERENTIAL     Status: Normal   Collection Time   08/16/11  4:30 PM      Component Value Range   Neutrophils Relative 58  43 - 77 (%)   Neutro Abs 2.5  1.7 - 7.7 (K/uL)   Lymphocytes Relative 24  12 - 46 (%)   Lymphs Abs 1.1  0.7 - 4.0 (K/uL)   Monocytes Relative 12  3 - 12 (%)   Monocytes Absolute 0.5  0.1 - 1.0 (K/uL)   Eosinophils Relative 5  0 - 5 (%)   Eosinophils Absolute 0.2  0.0 - 0.7 (K/uL)   Basophils Relative 1  0 - 1 (%)   Basophils Absolute 0.0  0.0 - 0.1 (K/uL)  COMPREHENSIVE METABOLIC PANEL     Status: Abnormal   Collection Time   08/16/11  4:30 PM      Component Value Range   Sodium 135  135 - 145 (mEq/L)   Potassium 5.2 (*) 3.5 - 5.1 (mEq/L)   Chloride 101  96 - 112 (mEq/L)   CO2 22  19 - 32 (mEq/L)   Glucose, Bld 95  70 - 99 (mg/dL)   BUN 56 (*) 6 - 23 (mg/dL)   Creatinine, Ser 9.14 (*) 0.50 - 1.10 (mg/dL)   Calcium 9.0  8.4 - 78.2 (mg/dL)   Total Protein 7.8  6.0 - 8.3 (g/dL)   Albumin 3.0 (*) 3.5 - 5.2 (g/dL)   AST 26  0 - 37 (U/L)   ALT 23  0 - 35 (U/L)   Alkaline Phosphatase 92  39 - 117 (U/L)   Total Bilirubin 0.4  0.3 - 1.2 (mg/dL)   GFR calc non Af Amer 9 (*) >90 (mL/min)   GFR calc Af Amer 10 (*) >90 (mL/min)  MAGNESIUM     Status: Normal   Collection Time   08/16/11  4:30 PM      Component Value Range   Magnesium 2.1   1.5 - 2.5 (mg/dL)  URINALYSIS,  ROUTINE W REFLEX MICROSCOPIC     Status: Normal   Collection Time   08/16/11  4:57 PM      Component Value Range   Color, Urine YELLOW  YELLOW    APPearance CLEAR  CLEAR    Specific Gravity, Urine 1.013  1.005 - 1.030    pH 5.0  5.0 - 8.0    Glucose, UA NEGATIVE  NEGATIVE (mg/dL)   Hgb urine dipstick NEGATIVE  NEGATIVE    Bilirubin Urine NEGATIVE  NEGATIVE    Ketones, ur NEGATIVE  NEGATIVE (mg/dL)   Protein, ur NEGATIVE  NEGATIVE (mg/dL)   Urobilinogen, UA 0.2  0.0 - 1.0 (mg/dL)   Nitrite NEGATIVE  NEGATIVE    Leukocytes, UA NEGATIVE  NEGATIVE    US renal: IMPRESSION:  No hydronephrosis. The urinary bladder is decompressed and cannot  be evaluated.  EKG: unremarkable  CT Head: Unremarkable cranial CT.  CXR: Fine perihilar air space disease suggests mild pulmonary  edema/interstitial edema versus less likely atypical infection.  Assessment and Plan: [name] [name] is a 61 y.o. year old female w/ PMHx of COPD, tobacco use, and anxiety presenting with hyperkalemia, fatigue/weakness and acute renal failure.   1. ARF: etiology unknown but likely mutlifactorial including prenal causes of hypovolemia and hypotension and intrinsic causes. Per renal, pt was recently stopped on her lisinopril. Renal will be following and are ordering a DSDNA due to previous positive ANA. Renal also considering HUS TTP. This is a possibility as pt meets several criteria (anemia, thrombocytopenia, renal failure, though not febrile and no neurologic changes), though no obvious source of HUS so likely idiopathic. Renal will likely perform biopsy for further diagnoses. - Continue to follow renal input - Strict I/O - No IVF as renal thinks this is more of an intrinsic renal failure - Hold Lasix - Hold Lisinopril - Renal diet - BMET in am - F/u renal lab orders, DS DNA, and likely peripheral smear and Adam TS  2. Fatigue and weakness: Unsure of etiology though thyroid dysfunction  is high on differential. Also considering anemia vs malignancy due to wt loss, h/o tobacco use, and age.  - TSH  3. FEN/GI: No IVF as above. HyperK likely from ARF. - K-aexylate x1 - BMET in AM - renal diet.   4. Psych: Pt w/ h/o depression and on 2 SSRI's. Unsure of regimen choice.  - will try to clarify tomorrow - for now continue only Prozac as this is what nephrology reports she is taking  4. Prophylaxis:  - SCD (no heparin at this time due to concern for TTP)  5. Disposition: Pending clinical improvement and     Signed: MERRELL, DAVID, M.D. Family Medicine Resident PGY-1 [phone] 08/16/2011 7:17 PM  Have seen the patient and discussed her in detail with both nephrology and Dr. Konrad Dolores.  Discussion with renal reveals that she had baseline Cr of 0.87 and was referred to them in March of this year for rise to ~1.4 without obvious cause.  Two days ago patient was seen by them and found to have elevation of cr to the mid 3's.  Repeat today was 4.7 and they instructed the patient to come to the ED for admission.  I agree with the plan as formulated above. Betheny Suchecki MD 08/16/2011, 9:34 PM

## 2011-08-16 NOTE — ED Notes (Signed)
Admitting md at bedside to eval pt 

## 2011-08-16 NOTE — ED Provider Notes (Signed)
History  This chart was scribed for Dione Booze, MD by Bennett Scrape. This patient was seen in room STRE3/STRE3 and the patient's care was started at 4:01PM.  CSN: [id number]  Arrival date & time 08/16/11  1357   First MD Initiated Contact with Patient 08/16/11 1601      Chief Complaint  Patient presents with  . Weakness    The history is provided by the patient. No language interpreter was used.    [name] [name] is a 61 y.o. female with a h/o anxiety, COPD and fluid retention who presents to the Emergency Department complaining of 2 weeks of swelling in bilateral ankles with associated weakness and decreased appetite due to "everything tasting terrible". The weakness has been ongoing for the past 2 months but has gotten worse over the past 2 days. She reports multiple falls at home over the past 2 to 3 days due to her legs giving out on her. Pt states that she had blood work drawn 2 days ago and an [place] and a sonogram done on her kidneys today. She isn't clear on who ordered the [place] and sonogram. Pt states that [facility] called her and stated that she needed to come to the hospital for abnormal labs, but she is unsure of what was abnormal. She denies fever, nausea and emesis as associated symptoms. She denies smoking and alcohol use.   Pt thinks her PCP is Dr. Caroline More in [place] but is unsure.   Past Medical History  Diagnosis Date  . Anxiety   . COPD (chronic obstructive pulmonary disease)   . Fluid retention     Past Surgical History  Procedure Date  . Cholecystectomy   . Knee surgery     No family history on file.  History  Substance Use Topics  . Smoking status: Never Smoker-Quit 10 years ago  . Smokeless tobacco: Current User  . Alcohol Use: No     Review of Systems  Constitutional: Positive for appetite change. Negative for fever and chills.  Respiratory: Negative for shortness of breath.   Cardiovascular: Positive for leg swelling.    Gastrointestinal: Negative for nausea and vomiting.  Neurological: Positive for weakness.    Allergies  Review of patient's allergies indicates no known allergies.  Home Medications   Current Outpatient Rx  Name Route Sig Dispense Refill  . ALBUTEROL SULFATE HFA 108 (90 BASE) MCG/ACT IN AERS Inhalation Inhale 2 puffs into the lungs every 6 (six) hours as needed. For shortness of breath    . CITALOPRAM HYDROBROMIDE 20 MG PO TABS Oral Take 20 mg by mouth daily.    Marland Kitchen FLUOXETINE HCL 20 MG PO CAPS Oral Take 20 mg by mouth daily.    . FUROSEMIDE 40 MG PO TABS Oral Take 40 mg by mouth daily.    Marland Kitchen HYDROCODONE-ACETAMINOPHEN 10-325 MG PO TABS Oral Take 1 tablet by mouth every 6 (six) hours as needed. For pain    . PROMETHAZINE HCL 25 MG PO TABS Oral Take 25 mg by mouth every 6 (six) hours as needed. For nausea      Triage Vitals: BP 121/49  Pulse 101  Temp(Src) 98.4 F (36.9 C) (Oral)  Resp 22  Ht 5\' 7"  (1.702 m)  Wt 248 lb (112.492 kg)  BMI 38.84 kg/m2  SpO2 97%  Physical Exam  Nursing note and vitals reviewed. Constitutional: She is oriented to person, place, and time. She appears well-developed and well-nourished. No distress.       obese  HENT:  Head: Normocephalic and atraumatic.  Eyes: EOM are normal.  Neck: Neck supple. No tracheal deviation present.  Cardiovascular: Normal rate and regular rhythm.   Pulmonary/Chest: Effort normal and breath sounds normal. No respiratory distress.  Abdominal: She exhibits no distension.  Musculoskeletal: Normal range of motion.       Extremities have 2+ edema  Neurological: She is alert and oriented to person, place, and time.       Strength 5/5 in all muscle groups  Skin: Skin is warm and dry.  Psychiatric: She has a normal mood and affect. Her behavior is normal.    ED Course  Procedures (including critical care time)  DIAGNOSTIC STUDIES: Oxygen Saturation is 97% on room air, adequate by my interpretation.    COORDINATION OF  CARE: 4:28PM-Discussed treatment plan of CT scan of head, EKG, blood work and urinalysis with pt and pt agreed to plan. 5:31PM-Informed pt of lab work results and need for admission due to elevated kidney enzymes and pt agreed.  Labs Reviewed  URINALYSIS, ROUTINE W REFLEX MICROSCOPIC - Abnormal; Notable for the following:    APPearance CLOUDY (*)    Bilirubin Urine SMALL (*)    Leukocytes, UA LARGE (*)    All other components within normal limits  URINE MICROSCOPIC-ADD ON - Abnormal; Notable for the following:    Squamous Epithelial / LPF MANY (*)    Bacteria, UA MANY (*)    Casts HYALINE CASTS (*)    All other components within normal limits  CBC - Abnormal; Notable for the following:    RBC 3.38 (*)    Hemoglobin 10.6 (*)    HCT 32.2 (*)    Platelets 124 (*)    All other components within normal limits  COMPREHENSIVE METABOLIC PANEL - Abnormal; Notable for the following:    Potassium 5.2 (*)    BUN 56 (*)    Creatinine, Ser 4.75 (*)    Albumin 3.0 (*)    GFR calc non Af Amer 9 (*)    GFR calc Af Amer 10 (*)    All other components within normal limits  DIFFERENTIAL  URINALYSIS, ROUTINE W REFLEX MICROSCOPIC  MAGNESIUM   US Renal  08/16/2011  *RADIOLOGY REPORT*  Clinical Data: Chronic kidney disease, elevated creatinine, hypertension  RENAL/URINARY TRACT ULTRASOUND COMPLETE  Comparison:  CT abdomen and pelvis of 10/20/2010  Findings:  Right Kidney:  No hydronephrosis is seen.  The right kidney measures 9.2 cm sagittally.  Left Kidney:  No hydronephrosis is noted.  The left kidney measures 9.2 cm.  Bladder:  The urinary bladder is decompressed and cannot be evaluated.  IMPRESSION: No hydronephrosis.  The urinary bladder is decompressed and cannot be evaluated.  Original Report Authenticated By: Juline Patch, M.D.   ECG shows normal sinus rhythm with a rate of 79, no ectopy. Normal axis. Normal P wave. Normal QRS. Normal intervals. Normal ST and T waves. Impression: normal ECG.  When compared with ECG of 06/19/2007, no significant changes are seen.    1. Acute renal failure       MDM  Patient is an extremely poor historian and family member who is with her is not any better. [place] report from today was reviewed and is normal. I do not understand why she was told to come to the emergency department on an emergent basis. However, given the fact that she has been evaluated for kidney disease, electrolytes will be obtained to see if she is hyperkalemic, and ECG  will be obtained to see if she shows any signs of hyperkalemia.  There no ECG signs of hyperkalemia but laboratory workup shows severe renal failure. The last renal tests that we have from [facility] less than a year ago and were completely normal. Low hemoglobin is consistent with renal failure. She needs to be evaluated for possible reversible causes of renal failure and if none are found, she is likely to need hemodialysis. Case is discussed with resident on call for family [facility] and arrangements are made for her hospital admission.      I personally performed the services described in this documentation, which was scribed in my presence. The recorded information has been reviewed and considered.      Dione Booze, MD 08/17/11 [phone]

## 2011-08-16 NOTE — ED Notes (Signed)
Report given -Patient transferred to floor

## 2011-08-16 NOTE — ED Notes (Signed)
Pt presents with 2 month h/o weakness, no appetite.  Pt reports multiple falls at home due to weakness.  Pt has been seen at PCP, had labs drawn and was referred here, pt unsure of what lab was abnormal.

## 2011-08-16 NOTE — Consult Note (Signed)
[name] [name] is an 61 y.o. female referred by Dr Audria Nine  Chief Complaint: Acute renal failure HPI: 61 yo WF sent to ER Because of rise in her Scr to 4.75.  Her Scr in 8/12 was .87 and in 2/13, 1.5 and in 4/13 1.47.  She was seen for renal consult by Dr Lowell Guitar on 08/14/11 and Scr was 3.99.  She was told to stop her Ace inhibitor and repeat Scr today was 4.78.  In Er tonight Scr 4.75.  She noticed some peripheral edema about 2 weeks ago and decreased UO in the last 2 days.  No hematuria, no stones, no foaminess to urine.  No NSAID's or any new meds.  She has hx of HTN of ? Duration but says only treated in last 2 yrs.  Various chronic aches but no new systemic symptoms.  In regards to recent work up :  Renal US unremarkable, SPEP neg for monoclonal protein, Nl complements, + ANA at 1:320, homogeneous, nl Hg, Plt sl low at 135 on 5/28 but today Hg down to 10.6 and Plt down to 124.   Past Medical History  Diagnosis Date  . Anxiety   . COPD (chronic obstructive pulmonary disease)   . Fluid retention   Hypertension  Past Surgical History  Procedure Date  . Cholecystectomy   . Knee surgery     No family history on file.neg for renal disease.  + for DM and HTN Social History:  Ex smoker quit 10 yrs ago but smoked forAllergies: No Known Allergies   (Not in a hospital admission)   Lab Results: UA: benign  Basename 08/16/11 1630  WBC 4.4  HGB 10.6*  HCT 32.2*  PLT 124*   BMET  Basename 08/16/11 1630  NA 135  K 5.2*  CL 101  CO2 22  GLUCOSE 95  BUN 56*  CREATININE 4.75*  CALCIUM 9.0  PHOS --   LFT  Basename 08/16/11 1630  PROT 7.8  ALBUMIN 3.0*  AST 26  ALT 23  ALKPHOS 92  BILITOT 0.4  BILIDIR --  IBILI --   Dg Chest 2 View  08/16/2011  *RADIOLOGY REPORT*  Clinical Data: Weakness and swelling overload.  Short of breath  CHEST - 2 VIEW  Comparison: Chest radiograph 09/02/2009  Findings: Normal cardiac silhouette.  There is a fine perihilar air space disease which is  new from prior.  No pleural fluid.  No pneumothorax.  IMPRESSION: Fine perihilar air space disease suggests mild pulmonary edema/interstitial edema versus less likely atypical infection.  Original Report Authenticated By: Genevive Bi, M.D.   Ct Head Wo Contrast  08/16/2011  *RADIOLOGY REPORT*  Clinical Data: Generalized weakness.  Multiple falls.  CT HEAD WITHOUT CONTRAST  Technique:  Contiguous axial images were obtained from the base of the skull through the vertex without contrast.  Comparison: None.  Findings: There is no evidence for acute infarction, intracranial hemorrhage, mass lesion, hydrocephalus, or extra-axial fluid.  No significant atrophy or white matter disease.  Intact calvarium.  No sinus or mastoid disease.  Negative orbits.  IMPRESSION: Unremarkable cranial CT.  Original Report Authenticated By: Elsie Stain, M.D.   US Renal  08/16/2011  *RADIOLOGY REPORT*  Clinical Data: Chronic kidney disease, elevated creatinine, hypertension  RENAL/URINARY TRACT ULTRASOUND COMPLETE  Comparison:  CT abdomen and pelvis of 10/20/2010  Findings:  Right Kidney:  No hydronephrosis is seen.  The right kidney measures 9.2 cm sagittally.  Left Kidney:  No hydronephrosis is noted.  The left kidney  measures 9.2 cm.  Bladder:  The urinary bladder is decompressed and cannot be evaluated.  IMPRESSION: No hydronephrosis.  The urinary bladder is decompressed and cannot be evaluated.  Original Report Authenticated By: Juline Patch, M.D.    ROSKennis Carina vision  For 1 yr No rash No new arthritic CO but does have chronic back pain Chronic DOE No change in bowels Poor appetite for 1 yr No dysuria  PHYSICAL EXAM: Blood pressure 118/77, pulse 87, temperature 98 F (36.7 C), temperature source Oral, resp. rate 16, height 5\' 7"  (1.702 m), weight 112.492 kg (248 lb), SpO2 99.00%. HEENT: PERRLA EOMI NECK:No JVD NO nodes LUNGS:decreased BS bases but clear CARDIAC:RRR wo MRG ABD:+BS NTND no HSM WUJ:WJXBJ  edema,  No purpura, no evidence of cholesterol emboli NEURO:CNI,  M&S intact,  Chronic resting tremor.  No asterixis Pulses 2/4 distally  Assessment: 1. Acute on CKD3.  The only thing that points to a possible etiology is the decrease in HG and Plt to suggest HUS or TTP however, she has no pupura and Hg was Nl at 11.3 when Scr was 3.99.  Other possibility is ARF from ACe inhibitior 2. Anemia and thrombocytopenia 3. HTN PLAN: 1. Check peripheral blood smear for schistocytes 2. Check AdamTS 13 3. Daily CBC, If plt and hg sig lower in am and smear shows schistocytes then will need plasmapheresis 4. Foley 5. Daily Scr 6. DC KCl 7. Give dose of kayexalate tonight   Anwyn Kriegel T 08/16/2011, 7:45 PM

## 2011-08-16 NOTE — Discharge Summary (Signed)
Family Medicine Resident Discharge Summary  Patient ID: [name] [name] [id number] 61 y.o. [birth year]  Admit date: 08/16/2011  Discharge date and time: No discharge date for patient encounter.   Admitting Physician: Lissa Morales, MD  Discharge Physician: Lissa Morales, MD  Admission Diagnoses: Abnormal labs, Weakenss, Renal failure  Discharge Diagnoses: Acute Renal failure, Acute Purulent Bronchitis, Silent aspirations, candidal esophagitis  Admission Condition: fair  Discharged Condition: good  Indication for Admission: elevated Cr and oligouria, generalized weakness/fatigue  Hospital Course: [name] [name] is a 61 y.o. year old female w/ PMHx of COPD, tobacco use, and anxiety who presented with hyperkalemia, fatigue/weakness and acute renal failure.   ARF: Pt w/ 2 day rise in Cr from 3 to 4.75 and oligouria on admission. Renal consulted on admission. Initial differential included prerenal vs intrinsic causes. REnal felt intrinsic more likely so IVF held. Renal ordered numerous tests to look for intrinsic causes such as lupus, wegeners, HUS TTP, etc. Results outlined below. Of note pt also w/ possible fibrotic changes concerning for autoimmune causes. Lisinopril and Lasix held. Cr. Improved throughout admission w/ return of normal UOP. Cr on day of DC 1.72.   Fatigue/Weakness: 2-3 month h/o profound weakness and fatigue on admission. Decreased PO over this time and complaining of some dysphagia. Pt TSH normal, but anemic. Unsure of baseline.  GI consulted and performed upper endoscopy and found to have esophageal candidiasis. Swallow study showed aspiration when swalowing thin liquids, but not when neck is flexed.   Hyperkalemia: K 5.2 on admission likely from ARF. No EKG changes. No cardiac complaints given K-aexylate x1 w/ resolution.   Psych: Pt home antidepresents continued during admission  Consults: nephrology, Pulm, GI  Significant Diagnostic Studies:  CBC Status: Abnormal     Collection Time    08/16/11 4:30 PM   Component  Value  Range    WBC  4.4  4.0 - 10.5 (K/uL)    RBC  3.38 (*)  3.87 - 5.11 (MIL/uL)    Hemoglobin  10.6 (*)  12.0 - 15.0 (g/dL)    HCT  32.2 (*)  36.0 - 46.0 (%)    MCV  95.3  78.0 - 100.0 (fL)    MCH  31.4  26.0 - 34.0 (pg)    MCHC  32.9  30.0 - 36.0 (g/dL)    RDW  13.5  11.5 - 15.5 (%)    Platelets  124 (*)  150 - 400 (K/uL)    CMET     Component  Value  Range    Sodium  135  135 - 145 (mEq/L)    Potassium  5.2 (*)  3.5 - 5.1 (mEq/L)    Chloride  101  96 - 112 (mEq/L)    CO2  22  19 - 32 (mEq/L)    Glucose, Bld  95  70 - 99 (mg/dL)    BUN  56 (*)  6 - 23 (mg/dL)    Creatinine, Ser  4.75 (*)  0.50 - 1.10 (mg/dL)    Calcium  9.0  8.4 - 10.5 (mg/dL)    Total Protein  7.8  6.0 - 8.3 (g/dL)    Albumin  3.0 (*)  3.5 - 5.2 (g/dL)    AST  26  0 - 37 (U/L)    ALT  23  0 - 35 (U/L)    Alkaline Phosphatase  92  39 - 117 (U/L)    Total Bilirubin  0.4  0.3 - 1.2 (mg/dL)    GFR calc  non Af Amer  9 (*)  >90 (mL/min)    GFR calc Af Amer  10 (*)  >90 (mL/min)   MAGNESIUM Status: Normal   Component  Value  Range    Magnesium  2.1  1.5 - 2.5 (mg/dL)   URINALYSIS, ROUTINE W REFLEX MICROSCOPIC Status: Normal    Collection Time    08/16/11 4:57 PM   Component  Value  Range    Color, Urine  YELLOW  YELLOW    APPearance  CLEAR  CLEAR    Specific Gravity, Urine  1.013  1.005 - 1.030    pH  5.0  5.0 - 8.0    Glucose, UA  NEGATIVE  NEGATIVE (mg/dL)    Hgb urine dipstick  NEGATIVE  NEGATIVE    Bilirubin Urine  NEGATIVE  NEGATIVE    Ketones, ur  NEGATIVE  NEGATIVE (mg/dL)    Protein, ur  NEGATIVE  NEGATIVE (mg/dL)    Urobilinogen, UA  0.2  0.0 - 1.0 (mg/dL)    Nitrite  NEGATIVE  NEGATIVE    Leukocytes, UA  NEGATIVE  NEGATIVE    US renal: IMPRESSION: No hydronephrosis. The urinary bladder is decompressed and cannot be evaluated.  EKG: unremarkable  CT Head: Unremarkable cranial CT.   CXR: Fine perihilar air space disease suggests mild  pulmonary edema/interstitial edema versus less likely atypical infection.  Upper Ednoscopy: esophogeal candidiasis  Swallow Study: Dysphagia Diagnosis: Mild pharyngeal phase dysphagia  Clinical impression: Pt. exhibited mild sensory pharyngeal dysphagia with delayed swallow initiation to valleculae and laryngeal penetration with thin (intermittent flash and penetration remaining in vestibule). A chin tuck posture consistently prevented penetration. Esophagus was scanned briefly revealing what appeared to be intermittent residue in distal esophagus (observed once). Pt. masticated graham cracker and stated "that didn't scratch and went down pretty good." Suspect pt.'s esophageal symptoms are most likely resulting from either pain/discomfort due to esophagitis. SLP recommends pt. upgrade to Dys 2 texture and continue thin liquids (pt. in agreement) and follow reflux precautions.   2D Echo: Left ventricle: The cavity size was normal. Wall thickness was normal. Systolic function was normal. The estimated ejection fraction was in the range of 55% to 60%. Wall motion was normal; there were no regional wall motion abnormalities. Doppler parameters are consistent with abnormal left ventricular relaxation (grade 1 diastolic dysfunction). - Aortic valve: Mild regurgitation. - Mitral valve: Mild regurgitation. - Pulmonary arteries: Systolic pressure was mildly increased. PA peak pressure: 32mm Hg (S).  GBM Ab <1 Myeloperoxidase Abs 4 Serine Protease 3 2 c-ANCA Screen POSITIVE C-ANCA 1:80  p-ANCA Screen NEGATIVE P-ANCA PENDING Atypical p-ANCA Screen NEGATIVE  Atypical P-ANCA titer PENDING  TSH: 0.552  LE venous doppler: - No evidence of deep vein or superficial thrombosis involving the right lower extremity and left lower extremity. - No evidence of Baker's cyst on the right or left.  LDH: 191 Sed Rate: 70 Adams TS: >95 Haptoglobin: 115  ds DNA: 35 ENA 1  Myeloperoxidase Abs 5  Serine Protease 3  2      SSA (Ro) (ENA) Antibody, IgG 201    SSB (La) (ENA) Antibody, IgG 73    Scleroderma (Scl-70) (ENA) Antibody, IgG 2    Sm/rnp 1     BNP    Component Value Date/Time   PROBNP 501.3* 08/17/2011 1110   Discharge Exam: Gen: NAD, obese  HEENT: MMM  CV: RRR  Res: normal effort, CTAB  Abd: non-painful to palpation  Ext/Musc: 2+ peripheral pulses Non edematous BL  LE, warm and well perfused.   Disposition: home  Patient Instructions:  Medication List  As of 08/16/2011  8:23 PM   ASK your doctor about these medications         albuterol 108 (90 BASE) MCG/ACT inhaler   Commonly known as: PROVENTIL HFA;VENTOLIN HFA   Inhale 2 puffs into the lungs every 6 (six) hours as needed. For shortness of breath      citalopram 20 MG tablet   Commonly known as: CELEXA   Take 20 mg by mouth daily.      FLUoxetine 20 MG capsule   Commonly known as: PROZAC   Take 20 mg by mouth daily.      furosemide 40 MG tablet   Commonly known as: LASIX   Take 40 mg by mouth daily.      HYDROcodone-acetaminophen 10-325 MG per tablet   Commonly known as: NORCO   Take 1 tablet by mouth every 6 (six) hours as needed. For pain      promethazine 25 MG tablet   Commonly known as: PHENERGAN   Take 25 mg by mouth every 6 (six) hours as needed. For nausea            Activity: activity as tolerated Diet: regular diet Wound Care: none needed  Follow-up with Caroline More, PA [phone]   Follow-up Items: 1. Acute renal failure causes. Lisinopril, autoimmune 2. Thrombocytopenia 3. Wt loss 4. Esophageal Candidiasis 5. Acute Bronchitis  Signed: Shelly Flatten, MD Family Medicine Resident PGY-1 [phone] 08/16/2011 8:23 PM  Results for orders placed during the hospital encounter of 08/16/11 (from the past 72 hour(s))  GLUCOSE, CAPILLARY     Status: Abnormal   Collection Time   08/18/11 10:07 PM      Component Value Range Comment   Glucose-Capillary 215 (*) 70 - 99 (mg/dL)     Comment 1 Notify RN     BASIC METABOLIC PANEL     Status: Abnormal   Collection Time   08/19/11  8:50 AM      Component Value Range Comment   Sodium 139  135 - 145 (mEq/L)    Potassium 4.6  3.5 - 5.1 (mEq/L)    Chloride 105  96 - 112 (mEq/L)    CO2 24  19 - 32 (mEq/L)    Glucose, Bld 85  70 - 99 (mg/dL)    BUN 45 (*) 6 - 23 (mg/dL)    Creatinine, Ser 4.78 (*) 0.50 - 1.10 (mg/dL)    Calcium 8.8  8.4 - 10.5 (mg/dL)    GFR calc non Af Amer 21 (*) >90 (mL/min)    GFR calc Af Amer 24 (*) >90 (mL/min)   CBC     Status: Abnormal   Collection Time   08/19/11  8:50 AM      Component Value Range Comment   WBC 4.4  4.0 - 10.5 (K/uL)    RBC 3.25 (*) 3.87 - 5.11 (MIL/uL)    Hemoglobin 10.1 (*) 12.0 - 15.0 (g/dL)    HCT 29.5 (*) 62.1 - 46.0 (%)    MCV 95.4  78.0 - 100.0 (fL)    MCH 31.1  26.0 - 34.0 (pg)    MCHC 32.6  30.0 - 36.0 (g/dL)    RDW 30.8  65.7 - 84.6 (%)    Platelets 118 (*) 150 - 400 (K/uL) CONSISTENT WITH PREVIOUS RESULT  BASIC METABOLIC PANEL     Status: Abnormal   Collection Time   08/19/11 11:17  PM      Component Value Range Comment   Sodium 138  135 - 145 (mEq/L)    Potassium 4.3  3.5 - 5.1 (mEq/L)    Chloride 106  96 - 112 (mEq/L)    CO2 24  19 - 32 (mEq/L)    Glucose, Bld 89  70 - 99 (mg/dL)    BUN 43 (*) 6 - 23 (mg/dL)    Creatinine, Ser 2.18 (*) 0.50 - 1.10 (mg/dL)    Calcium 8.3 (*) 8.4 - 10.5 (mg/dL)    GFR calc non Af Amer 23 (*) >90 (mL/min)    GFR calc Af Amer 27 (*) >90 (mL/min)   CBC     Status: Abnormal   Collection Time   08/19/11 11:17 PM      Component Value Range Comment   WBC 3.8 (*) 4.0 - 10.5 (K/uL)    RBC 3.00 (*) 3.87 - 5.11 (MIL/uL)    Hemoglobin 9.5 (*) 12.0 - 15.0 (g/dL)    HCT 28.6 (*) 36.0 - 46.0 (%)    MCV 95.3  78.0 - 100.0 (fL)    MCH 31.7  26.0 - 34.0 (pg)    MCHC 33.2  30.0 - 36.0 (g/dL)    RDW 13.6  11.5 - 15.5 (%)    Platelets 99 (*) 150 - 400 (K/uL) CONSISTENT WITH PREVIOUS RESULT  CBC     Status: Abnormal   Collection Time    08/20/11 12:43 PM      Component Value Range Comment   WBC 3.3 (*) 4.0 - 10.5 (K/uL)    RBC 3.29 (*) 3.87 - 5.11 (MIL/uL)    Hemoglobin 10.4 (*) 12.0 - 15.0 (g/dL)    HCT 31.4 (*) 36.0 - 46.0 (%)    MCV 95.4  78.0 - 100.0 (fL)    MCH 31.6  26.0 - 34.0 (pg)    MCHC 33.1  30.0 - 36.0 (g/dL)    RDW 13.5  11.5 - 15.5 (%)    Platelets 106 (*) 150 - 400 (K/uL) CONSISTENT WITH PREVIOUS RESULT  BASIC METABOLIC PANEL     Status: Abnormal   Collection Time   08/20/11 12:43 PM      Component Value Range Comment   Sodium 138  135 - 145 (mEq/L)    Potassium 4.3  3.5 - 5.1 (mEq/L)    Chloride 104  96 - 112 (mEq/L)    CO2 22  19 - 32 (mEq/L)    Glucose, Bld 76  70 - 99 (mg/dL)    BUN 38 (*) 6 - 23 (mg/dL)    Creatinine, Ser 1.86 (*) 0.50 - 1.10 (mg/dL)    Calcium 8.8  8.4 - 10.5 (mg/dL)    GFR calc non Af Amer 28 (*) >90 (mL/min)    GFR calc Af Amer 32 (*) >90 (mL/min)   CBC     Status: Abnormal   Collection Time   08/21/11  5:55 AM      Component Value Range Comment   WBC 4.1  4.0 - 10.5 (K/uL)    RBC 3.36 (*) 3.87 - 5.11 (MIL/uL)    Hemoglobin 10.8 (*) 12.0 - 15.0 (g/dL)    HCT 32.3 (*) 36.0 - 46.0 (%)    MCV 96.1  78.0 - 100.0 (fL)    MCH 32.1  26.0 - 34.0 (pg)    MCHC 33.4  30.0 - 36.0 (g/dL)    RDW 13.5  11.5 - 15.5 (%)    Platelets 129 (*) 150 -  400 (K/uL)   BASIC METABOLIC PANEL     Status: Abnormal   Collection Time   08/21/11  5:55 AM      Component Value Range Comment   Sodium 140  135 - 145 (mEq/L)    Potassium 4.5  3.5 - 5.1 (mEq/L)    Chloride 106  96 - 112 (mEq/L)    CO2 22  19 - 32 (mEq/L)    Glucose, Bld 89  70 - 99 (mg/dL)    BUN 31 (*) 6 - 23 (mg/dL)    Creatinine, Ser 1.61 (*) 0.50 - 1.10 (mg/dL)    Calcium 8.9  8.4 - 10.5 (mg/dL)    GFR calc non Af Amer 31 (*) >90 (mL/min)    GFR calc Af Amer 36 (*) >90 (mL/min)    Dg Chest 2 View  08/16/2011  *RADIOLOGY REPORT*  Clinical Data: Weakness and swelling overload.  Short of breath  CHEST - 2 VIEW  Comparison: Chest  radiograph 09/02/2009  Findings: Normal cardiac silhouette.  There is a fine perihilar air space disease which is new from prior.  No pleural fluid.  No pneumothorax.  IMPRESSION: Fine perihilar air space disease suggests mild pulmonary edema/interstitial edema versus less likely atypical infection.  Original Report Authenticated By: Genevive Bi, M.D.   Ct Head Wo Contrast  08/16/2011  *RADIOLOGY REPORT*  Clinical Data: Generalized weakness.  Multiple falls.  CT HEAD WITHOUT CONTRAST  Technique:  Contiguous axial images were obtained from the base of the skull through the vertex without contrast.  Comparison: None.  Findings: There is no evidence for acute infarction, intracranial hemorrhage, mass lesion, hydrocephalus, or extra-axial fluid.  No significant atrophy or white matter disease.  Intact calvarium.  No sinus or mastoid disease.  Negative orbits.  IMPRESSION: Unremarkable cranial CT.  Original Report Authenticated By: Elsie Stain, M.D.   Ct Chest Wo Contrast  08/17/2011  *RADIOLOGY REPORT*  Clinical Data: Acute renal failure.  Positive ANA.  Evaluate for interstitial lung disease.  CT CHEST WITHOUT CONTRAST  Technique:  Multidetector CT imaging of the chest was performed following the standard protocol without IV contrast.  Comparison: Chest CT 06/20/2007.  Findings:  Mediastinum: Heart size is normal. There is no significant pericardial fluid, thickening or pericardial calcification. There is atherosclerosis of the thoracic aorta, the great vessels of the mediastinum and the coronary arteries, including calcified atherosclerotic plaque in the left main, left anterior descending, left circumflex and right coronary arteries. Calcifications of the mitral subvalvular apparatus (likely the papillary tips). Mildly enlarged right paratracheal lymph node demonstrates a normal appearing fatty hilum (11 mm), this is nonspecific but favored to be reactive.  No other pathologically enlarged mediastinal or  hilar lymph nodes. Please note that accurate exclusion of hilar adenopathy is limited on noncontrast CT scans. There is a small hiatal hernia.  Lungs/Pleura: The examination was not performed as a high resolution CT of the thorax which limits assessment for interstitial lung disease.  There are patchy bilateral areas of ground-glass attenuation which are most pronounced in the perihilar regions, with a slight upper lung predominance.  In addition, there appeared be some regions of mild bronchial wall thickening with mild bronchiectasis and peripheral bronchiolectasis (this is most evident within the left upper lobe from images 13 - 21 of series 3).  There is sparing of the periphery of the lungs, particularly in the subpleural regions.  As well, the lung bases themselves are well preserved.  Thin calcified pleural plaques are noted  dependently within the left hemithorax.  No right-sided pleural plaques are identified.  Upper Abdomen: Status post cholecystectomy.  Musculoskeletal: Old healed fracture of the posterolateral aspect of the right fourth rib. There are no aggressive appearing lytic or blastic lesions noted in the visualized portions of the skeleton.  IMPRESSION: 1.  The predominant pattern in the lungs is one of patchy ground glass attenuation, with a slight perihilar and upper lung predominance.  This is an unusual and nonspecific pattern, but is similar to the distribution of air space disease noted on remote prior CT scan 06/20/2007. The most simple explanation for these findings would be mild interstitial pulmonary edema on today's examination, superimposed upon a background of scarring.  Because of the similar distribution to the prior study, if these findings are infract chronic rather than acute, they would be favored to represent residual scarring from remote episode of acute interstitial pneumonia (likely in April 2009).  Other differential considerations would include desquamative interstitial  pneumonia (DIP) if the patient has a history of smoking, or less likely nonspecific interstitial pneumonia (NSIP).  No cystic lung changes to suggest lymphoid interstitial pneumonia (LIP), and the pattern is not consistent with a usual interstitial pneumonia (UIP) pattern typically seen in cases of idiopathic pulmonary fibrosis. This could be best differentiated by repeat high resolution CT of the thorax after the patient's acute respiratory exacerbation has resolved. 2. Atherosclerosis, including left main and three-vessel coronary artery disease. Please note that although the presence of coronary artery calcium documents the presence of coronary artery disease, the severity of this disease and any potential stenosis cannot be assessed on this non-gated CT examination.  Assessment for potential risk factor modification, dietary therapy or pharmacologic therapy may be warranted, if clinically indicated. 3. There are calcifications of the mitral subvalvular apparatus. Echocardiographic correlation for evaluation of potential valvular dysfunction may be warranted if clinically indicated. 4. Calcified pleural plaques in the posterior aspect of the left hemithorax, presumably secondary to prior left-sided pleural trauma or infection. 5. Status post cholecystectomy.  Original Report Authenticated By: Florencia Reasons, M.D.

## 2011-08-17 ENCOUNTER — Inpatient Hospital Stay (HOSPITAL_COMMUNITY): Payer: Medicare Other

## 2011-08-17 DIAGNOSIS — M79609 Pain in unspecified limb: Secondary | ICD-10-CM

## 2011-08-17 DIAGNOSIS — K209 Esophagitis, unspecified: Secondary | ICD-10-CM

## 2011-08-17 DIAGNOSIS — N179 Acute kidney failure, unspecified: Secondary | ICD-10-CM

## 2011-08-17 DIAGNOSIS — J411 Mucopurulent chronic bronchitis: Secondary | ICD-10-CM

## 2011-08-17 DIAGNOSIS — R9389 Abnormal findings on diagnostic imaging of other specified body structures: Secondary | ICD-10-CM

## 2011-08-17 DIAGNOSIS — R1310 Dysphagia, unspecified: Secondary | ICD-10-CM

## 2011-08-17 DIAGNOSIS — R918 Other nonspecific abnormal finding of lung field: Secondary | ICD-10-CM

## 2011-08-17 DIAGNOSIS — J9600 Acute respiratory failure, unspecified whether with hypoxia or hypercapnia: Secondary | ICD-10-CM

## 2011-08-17 DIAGNOSIS — J84113 Idiopathic non-specific interstitial pneumonitis: Secondary | ICD-10-CM

## 2011-08-17 DIAGNOSIS — J9601 Acute respiratory failure with hypoxia: Secondary | ICD-10-CM

## 2011-08-17 DIAGNOSIS — I359 Nonrheumatic aortic valve disorder, unspecified: Secondary | ICD-10-CM

## 2011-08-17 DIAGNOSIS — R0602 Shortness of breath: Secondary | ICD-10-CM

## 2011-08-17 LAB — COMPREHENSIVE METABOLIC PANEL
Albumin: 2.6 g/dL — ABNORMAL LOW (ref 3.5–5.2)
BUN: 52 mg/dL — ABNORMAL HIGH (ref 6–23)
Calcium: 8.7 mg/dL (ref 8.4–10.5)
Chloride: 104 mEq/L (ref 96–112)
Creatinine, Ser: 4.01 mg/dL — ABNORMAL HIGH (ref 0.50–1.10)
Total Bilirubin: 0.4 mg/dL (ref 0.3–1.2)

## 2011-08-17 LAB — DIFFERENTIAL
Basophils Absolute: 0 10*3/uL (ref 0.0–0.1)
Lymphocytes Relative: 28 % (ref 12–46)
Monocytes Absolute: 0.6 10*3/uL (ref 0.1–1.0)
Neutro Abs: 2.1 10*3/uL (ref 1.7–7.7)
Neutrophils Relative %: 52 % (ref 43–77)

## 2011-08-17 LAB — CBC
MCV: 94.5 fL (ref 78.0–100.0)
Platelets: 109 10*3/uL — ABNORMAL LOW (ref 150–400)
RBC: 2.91 MIL/uL — ABNORMAL LOW (ref 3.87–5.11)
WBC: 3.9 10*3/uL — ABNORMAL LOW (ref 4.0–10.5)

## 2011-08-17 LAB — CREATININE, URINE, RANDOM: Creatinine, Urine: 92.46 mg/dL

## 2011-08-17 LAB — PHOSPHORUS: Phosphorus: 5.4 mg/dL — ABNORMAL HIGH (ref 2.3–4.6)

## 2011-08-17 LAB — HAPTOGLOBIN: Haptoglobin: 115 mg/dL (ref 45–215)

## 2011-08-17 LAB — BASIC METABOLIC PANEL
BUN: 50 mg/dL — ABNORMAL HIGH (ref 6–23)
Chloride: 104 mEq/L (ref 96–112)

## 2011-08-17 LAB — CK: Total CK: 21 U/L (ref 7–177)

## 2011-08-17 LAB — SODIUM, URINE, RANDOM: Sodium, Ur: 48 mEq/L

## 2011-08-17 LAB — PATHOLOGIST SMEAR REVIEW

## 2011-08-17 LAB — PRO B NATRIURETIC PEPTIDE: Pro B Natriuretic peptide (BNP): 501.3 pg/mL — ABNORMAL HIGH (ref 0–125)

## 2011-08-17 MED ORDER — ALBUTEROL SULFATE HFA 108 (90 BASE) MCG/ACT IN AERS
2.0000 | INHALATION_SPRAY | Freq: Two times a day (BID) | RESPIRATORY_TRACT | Status: DC
  Administered 2011-08-18 – 2011-08-21 (×6): 2 via RESPIRATORY_TRACT

## 2011-08-17 MED ORDER — ALBUTEROL SULFATE HFA 108 (90 BASE) MCG/ACT IN AERS
2.0000 | INHALATION_SPRAY | Freq: Four times a day (QID) | RESPIRATORY_TRACT | Status: DC
  Administered 2011-08-17: 2 via RESPIRATORY_TRACT
  Filled 2011-08-17: qty 6.7

## 2011-08-17 MED ORDER — CLONAZEPAM 1 MG PO TABS
1.0000 mg | ORAL_TABLET | Freq: Two times a day (BID) | ORAL | Status: DC
  Administered 2011-08-17 – 2011-08-21 (×8): 1 mg via ORAL
  Filled 2011-08-17 (×8): qty 1

## 2011-08-17 MED ORDER — TIOTROPIUM BROMIDE MONOHYDRATE 18 MCG IN CAPS
18.0000 ug | ORAL_CAPSULE | Freq: Every day | RESPIRATORY_TRACT | Status: DC
  Administered 2011-08-18 – 2011-08-21 (×3): 18 ug via RESPIRATORY_TRACT
  Filled 2011-08-17: qty 5

## 2011-08-17 MED ORDER — ALBUTEROL SULFATE HFA 108 (90 BASE) MCG/ACT IN AERS: 2.0000 | INHALATION_SPRAY | RESPIRATORY_TRACT | Status: DC | PRN

## 2011-08-17 MED ORDER — NEPRO/CARBSTEADY PO LIQD
237.0000 mL | Freq: Three times a day (TID) | ORAL | Status: DC
  Administered 2011-08-17 – 2011-08-19 (×3): 237 mL via ORAL

## 2011-08-17 MED ORDER — SIMETHICONE 80 MG PO CHEW
80.0000 mg | CHEWABLE_TABLET | Freq: Four times a day (QID) | ORAL | Status: DC | PRN
  Administered 2011-08-17: 80 mg via ORAL
  Filled 2011-08-17: qty 1

## 2011-08-17 NOTE — Progress Notes (Signed)
   CARE MANAGEMENT NOTE 08/17/2011  Patient:  [name] [name],[name] [name]   Account Number:  [id number]  Date Initiated:  08/17/2011  Documentation initiated by:  Darlyne Russian  Subjective/Objective Assessment:   Patient admitted with lower leg pain and no urine output.     Action/Plan:   Progression of care and discharge planning   Anticipated DC Date:  08/20/2011   Anticipated DC Plan:  HOME W HOME HEALTH SERVICES      DC Planning Services  CM consult      Choice offered to / List presented to:             Status of service:  In process, will continue to follow Medicare Important Message given?   (If response is "NO", the following Medicare IM given date fields will be blank) Date Medicare IM given:   Date Additional Medicare IM given:    Discharge Disposition:    Per UR Regulation:    If discussed at Long Length of Stay Meetings, dates discussed:    Comments:  08/17/2011  [address] RN, [place] [phone] PCP: Dr Lanora Manis Prince/ [facility]  Met with patient to discuss CM and discharge planning. She lives at home with her spouse and son, one level trailer. She has O2 1.5 L via [place] to use at night and during the day when SOB. No previous or current home health services. CM to continue to follow for discharge planning needs.

## 2011-08-17 NOTE — Progress Notes (Signed)
  Echocardiogram 2D Echocardiogram has been performed.  Cathie Beams Deneen 08/17/2011, 12:27 PM

## 2011-08-17 NOTE — H&P (Signed)
I have seen and examined this patient. I have discussed with Ritch.  I agree with their findings and plans as documented in their admission note for today.

## 2011-08-17 NOTE — Progress Notes (Signed)
VASCULAR LAB PRELIMINARY  PRELIMINARY  PRELIMINARY  PRELIMINARY  Bilateral lower extremity venous duplex completed.    Preliminary report:  Bilateral:  No obvious evidence of DVT, superficial thrombosis, or Baker's Cyst.   Thurmon Mizell D, RVS 08/17/2011, 12:24 PM

## 2011-08-17 NOTE — Progress Notes (Signed)
INITIAL ADULT NUTRITION ASSESSMENT Date: 08/17/2011   Time: 2:34 PM  Reason for Assessment: Nutrition risk, dysphagia  ASSESSMENT: Female 61 y.o.  Dx: Acute renal failure  Hx:  Past Medical History  Diagnosis Date  . Anxiety   . COPD (chronic obstructive pulmonary disease)   . Fluid retention   . Acute renal failure   . Shortness of breath   . Dizziness   . Angina   . Degenerative disorder of eye     Right eye    Related Meds:     . albuterol  2 puff Inhalation Q6H  . aspirin EC  81 mg Oral Daily  . clonazePAM  1 mg Oral BID  . FLUoxetine  20 mg Oral Daily  . sodium chloride  3 mL Intravenous Q12H  . sodium polystyrene  15 g Oral Once  . tiotropium  18 mcg Inhalation Daily     Ht: 5\' 7"  (170.2 cm)  Wt: 251 lb 15.8 oz (114.3 kg)  Ideal Wt: 61.6 kg  % Ideal Wt: 186%  Usual Wt: 160 kg % Usual Wt: 71%  Body mass index is 39.47 kg/(m^2), obesity  Food/Nutrition Related Hx: Patient reports she has had a poor PO intake over the last year and a half resulting in a 29% weight loss over that time. She reports that food tastes bland. She can tolerate sweeter foods. Patient meets the criteria for severe malnutrition in the context of chronic illness with weight loss and PO intake <75% estimated needs for 1.5 years.   Labs:  CMP     Component Value Date/Time   NA 135 08/17/2011 0525   K 5.0 08/17/2011 0525   CL 104 08/17/2011 0525   CO2 22 08/17/2011 0525   GLUCOSE 80 08/17/2011 0525   BUN 52* 08/17/2011 0525   CREATININE 4.01* 08/17/2011 0525   CALCIUM 8.7 08/17/2011 0525   PROT 6.7 08/17/2011 0525   ALBUMIN 2.6* 08/17/2011 0525   AST 24 08/17/2011 0525   ALT 18 08/17/2011 0525   ALKPHOS 80 08/17/2011 0525   BILITOT 0.4 08/17/2011 0525   GFRNONAA 11* 08/17/2011 0525   GFRAA 13* 08/17/2011 0525    Intake/Output Summary (Last 24 hours) at 08/17/11 1434 Last data filed at 08/17/11 1242  Gross per 24 hour  Intake    483 ml  Output    350 ml  Net    133 ml    Diet  Order: Renal 80/90  Supplements/Tube Feeding: None  IVF:    Estimated Nutritional Needs:   Kcal:1900-2100 kcal Protein:90-100 g Fluid:2.1 L  NUTRITION DIAGNOSIS: -Inadequate oral intake (NI-2.1).  Status: Ongoing  RELATED TO: foods tasting bland  AS EVIDENCE BY: 5% meal completion  MONITORING/EVALUATION(Goals): Patient will meet 90-100% of estimated needs with diet and supplements.   Monitor: PO intake, weight trends, labs  EDUCATION NEEDS: -No education needs identified at this time  INTERVENTION: Nepro shake TID between meals.    DOCUMENTATION CODES Per approved criteria  -Severe malnutrition in the context of chronic illness -Obesity Unspecified    Fabio Pierce 08/17/2011, 2:34 PM

## 2011-08-17 NOTE — Consult Note (Signed)
PULMONARY/CCM CONSULT NOTE  Requesting MD/Service:  Date of admission:  Date of consult: Reason for consultation:   Pt Profile:  60yowf with multiple constitutional complaints including progressive DOE over 2-3 months found to have acute on chronic renal failure and scattered ground glass opacities on CT chest. Pulmonary Med consultation requested 5/31 to eval pulmonary infiltrates.     HPI:  15 yowf very difficult historian who presented 5/30 to [facility]PTS  With a multitude of nonspecific constitutional complaints including weakness, DOE, abdominal pain radiating to back, moodiness, anorexia and insomnia. She has difficulty characterizing any of these complaints in detail and seems to have some degree of cognitive impairment. She does report that she is able to ambulate no further than a few steps before she is out of breath. She has been evaluated as an outpt for many of these complaints but is unable to describe to me exactly what has been done. Admission records indicate an unintentional 90# wt loss over past 18 mos. She denies fever, CP, hemoptysis, LE edema, calf tenderness. She does report cough productive of thick green sputum. On admission she was noted to have acute on chronic renal failure with Cr of 4.75.  Other eval has included ESR, ANA, DS DNA. These results are discussed below. Her admission CXR was abnormal and a CT chest was performed for further eval. The results are discussed below. Presently, she denies dyspnea @ rest and appears to be in no overt distress.  Past Medical History  Diagnosis Date  . Anxiety   . COPD (chronic obstructive pulmonary disease)   . Fluid retention   . Acute renal failure   . Shortness of breath   . Dizziness   . Angina   . Degenerative disorder of eye     Right eye    MEDICATIONS:  Home meds:  albuterol (PROVENTIL HFA;VENTOLIN HFA) 108 (90 BASE) MCG/ACT inhaler  Inhale 2 puffs into the lungs every 6 (six) hours as needed. For shortness of breath      .  citalopram (CELEXA) 20 MG tablet  Take 20 mg by mouth daily.     Marland Kitchen  FLUoxetine (PROZAC) 20 MG capsule  Take 20 mg by mouth daily.     .  furosemide (LASIX) 40 MG tablet  Take 40 mg by mouth daily.     Marland Kitchen  HYDROcodone-acetaminophen (NORCO) 10-325 MG per tablet  Take 1 tablet by mouth every 6 (six) hours as needed. For pain     .  promethazine (PHENERGAN) 25 MG tablet  Take 25 mg by mouth every 6 (six) hours as needed. For nausea        SH: former smoker X 30 yrs. Quit > 10 yrs ago. No significant occupational exposures. No unusual exposures to exotic pets, etc. No recent travel    Family History  Problem Relation Age of Onset  . Heart failure Mother   . Stroke Father     ROS - as per HPI, otherwise a detailed ROS is negative  Filed Vitals:   08/17/11 0903 08/17/11 0905 08/17/11 0910 08/17/11 1239  BP: 102/64   127/64  Pulse: 91 114 130 82  Temp: 98.1 F (36.7 C)   97.8 F (36.6 C)  TempSrc: Oral   Oral  Resp: 18   17  Height:      Weight:      SpO2: 99% 98% 96% 100%    EXAM:  Gen: Awake, alert, slightly bizarre affect, no overt distress, cognition seems impaired but  baseline unknown HEENT: NAD Neck: no  JVD Lungs: no accessory muscle use, BS slightly diminished, No adventitious sounds noted, percussion note normal throughout Cardiovascular: distant HS, no murmurs noted Abdomen: obese, soft, NT, NABS Ext: No edema Neuro: nonfocal Skin:  No lesions noted  DATA:  BMET    Component Value Date/Time   NA 135 08/17/2011 0525   K 5.0 08/17/2011 0525   CL 104 08/17/2011 0525   CO2 22 08/17/2011 0525   GLUCOSE 80 08/17/2011 0525   BUN 52* 08/17/2011 0525   CREATININE 4.01* 08/17/2011 0525   CALCIUM 8.7 08/17/2011 0525   GFRNONAA 11* 08/17/2011 0525   GFRAA 13* 08/17/2011 0525    CBC    Component Value Date/Time   WBC 3.9* 08/17/2011 0520   RBC 2.91* 08/17/2011 0520   HGB 9.2* 08/17/2011 0520   HCT 27.5* 08/17/2011 0520   PLT 109* 08/17/2011 0520   MCV 94.5 08/17/2011  0520   MCH 31.6 08/17/2011 0520   MCHC 33.5 08/17/2011 0520   RDW 13.6 08/17/2011 0520   LYMPHSABS 1.1 08/17/2011 0520   MONOABS 0.6 08/17/2011 0520   EOSABS 0.2 08/17/2011 0520   BASOSABS 0.0 08/17/2011 0520   ESR: 70 mm/hr  ANA: reportedly + at 1:340  Pro BNP 501  EKG: NSR, no acute abnormalities  Echo 5/31: Left ventricle: The cavity size was normal. Wall thickness was normal. Systolic function was normal. The estimated ejection fraction was in the range of 55% to 60%. Wall motion was normal; there were no regional wall motion abnormalities. Doppler parameters are consistent with abnormal left ventricular relaxation (grade 1 diastolic dysfunction).  CXR 5/30: interstitial prominence with confluence in lingula  CT chest 5/31: diffuse scattered ground glass opacities overall significantly improved since prior study in 2009   IMPRESSION:   Summary: -Nonspecific systemic complaints including DOE -Mildly abnormal CXR and CT chest (but notably, infiltrates are improved compared to CT chest 2009) in a pattern of pneumonitis vs interstitial edema -Acute on chronic renal insufficiency -Multiple laboratory abnormalities including elevate ESR, ANA, BNP -anemia and thrombocytopenia  Pulmonary problem: unexplained (and apparently recurrent) interstitial lung disease  Discussion:  It is unclear how or if the interstitial lung findings fit into this larger picture. DDx includes... - inflammatory pneumonitides including pulmonary-renal syndromes (doubt), SLE pneumonitis (elevated ESR/ANA), hypersensitivity pneumonitis - atypical infections (viral, mycoplasma, pneumocystis, etc) - interstitial edema (modestly elevated BNP but echocardiogram not supportive of this dx)   There is probably no role for bronchoscopic biopsy her as this would be insufficient to differentiate the above entities except for the infectious ones which seem very unlikely.  She is a very poor candidate for surgical lung  biopsy  Given what appears to have been resolution of a similar process in 2009, would favor completing WU for rest of her systemic complaints (agree with connective tissue disease eval) and following CXR over time. The utility of pursuing lung biopsy can be considered further with time  PLAN/REC:  1) Check HIV status (acknowledging that she has no risk factors noted so likely to be negative) 2) If not already done, woul check anti-GBM antibodies and ANCA for completeness (acknowledging that her presentation is not highly consistent with either Goodpasture's or Wegener's) 3) Follow up on pending lab tests 4) Repeat PA/lat CXR Monday 6/3 5) Pulmonary medicine will see again next week. Please call sooner if needed  Merton Border, MD;  PCCM service; Mobile [phone]

## 2011-08-17 NOTE — Progress Notes (Signed)
[facility] ROUNDING NOTE   Subjective:   Interval History: none.  Objective:  Vital signs in last 24 hours:  Temp:  [97.4 F (36.3 C)-98.1 F (36.7 C)] 97.8 F (36.6 C) (05/31 1239) Pulse Rate:  [82-130] 82  (05/31 1239) Resp:  [16-18] 17  (05/31 1239) BP: (102-140)/(64-97) 127/64 mmHg (05/31 1239) SpO2:  [96 %-100 %] 100 % (05/31 1239) Weight:  [114.3 kg (251 lb 15.8 oz)] 114.3 kg (251 lb 15.8 oz) (05/30 2232)  Weight change:  Filed Weights   08/16/11 1419 08/16/11 2232  Weight: 112.492 kg (248 lb) 114.3 kg (251 lb 15.8 oz)    Intake/Output: I/O last 3 completed shifts: In: 240 [P.O.:240] Out: -    Intake/Output this shift:  Total I/O In: 243 [P.O.:240; I.V.:3] Out: 350 [Urine:350]  CVS- RRR RS- CTA ABD- BS present soft non-distended EXT- no edema   Basic Metabolic Panel:  Lab 08/17/11 1610 08/16/11 1630  NA 135 135  K 5.0 5.2*  CL 104 101  CO2 22 22  GLUCOSE 80 95  BUN 52* 56*  CREATININE 4.01* 4.75*  CALCIUM 8.7 9.0  MG -- 2.1  PHOS 5.4* --    Liver Function Tests:  Lab 08/17/11 0525 08/16/11 1630  AST 24 26  ALT 18 23  ALKPHOS 80 92  BILITOT 0.4 0.4  PROT 6.7 7.8  ALBUMIN 2.6* 3.0*   No results found for this basename: LIPASE:5,AMYLASE:5 in the last 168 hours No results found for this basename: AMMONIA:3 in the last 168 hours  CBC:  Lab 08/17/11 0520 08/16/11 1630  WBC 3.9* 4.4  NEUTROABS 2.1 2.5  HGB 9.2* 10.6*  HCT 27.5* 32.2*  MCV 94.5 95.3  PLT 109* 124*    Cardiac Enzymes:  Lab 08/17/11 1110  CKTOTAL 21  CKMB --  CKMBINDEX --  TROPONINI --    BNP: No components found with this basename: POCBNP:5  CBG: No results found for this basename: GLUCAP:5 in the last 168 hours  Microbiology: Results for orders placed during the hospital encounter of 06/19/07  URINE CULTURE     Status: Normal   Collection Time   06/19/07  6:37 PM      Component Value Range Status Comment   Specimen Description URINE,  RANDOM   Final    Special Requests CX ADDED AT 0212   Final    Colony Count 1,000 COLONIES/ML   Final    Culture INSIGNIFICANT GROWTH   Final    Report Status 06/21/2007 FINAL   Final   URINE CULTURE     Status: Normal   Collection Time   06/21/07  2:21 PM      Component Value Range Status Comment   Specimen Description URINE, CATHETERIZED   Final    Special Requests NONE   Final    Colony Count NO GROWTH   Final    Culture NO GROWTH   Final    Report Status 06/23/2007 FINAL   Final     Coagulation Studies:  Basename 08/16/11 2026  LABPROT 13.5  INR 1.01    Urinalysis:  Basename 08/16/11 1657 08/16/11 1459  COLORURINE YELLOW YELLOW  LABSPEC 1.013 1.017  PHURINE 5.0 5.0  GLUCOSEU NEGATIVE NEGATIVE  HGBUR NEGATIVE NEGATIVE  BILIRUBINUR NEGATIVE SMALL*  KETONESUR NEGATIVE NEGATIVE  PROTEINUR NEGATIVE NEGATIVE  UROBILINOGEN 0.2 0.2  NITRITE NEGATIVE NEGATIVE  LEUKOCYTESUR NEGATIVE LARGE*      Imaging: Dg Chest 2 View  08/16/2011  *RADIOLOGY REPORT*  Clinical Data: Weakness  and swelling overload.  Short of breath  CHEST - 2 VIEW  Comparison: Chest radiograph 09/02/2009  Findings: Normal cardiac silhouette.  There is a fine perihilar air space disease which is new from prior.  No pleural fluid.  No pneumothorax.  IMPRESSION: Fine perihilar air space disease suggests mild pulmonary edema/interstitial edema versus less likely atypical infection.  Original Report Authenticated By: Genevive Bi, M.D.   Ct Head Wo Contrast  08/16/2011  *RADIOLOGY REPORT*  Clinical Data: Generalized weakness.  Multiple falls.  CT HEAD WITHOUT CONTRAST  Technique:  Contiguous axial images were obtained from the base of the skull through the vertex without contrast.  Comparison: None.  Findings: There is no evidence for acute infarction, intracranial hemorrhage, mass lesion, hydrocephalus, or extra-axial fluid.  No significant atrophy or white matter disease.  Intact calvarium.  No sinus or mastoid  disease.  Negative orbits.  IMPRESSION: Unremarkable cranial CT.  Original Report Authenticated By: Elsie Stain, M.D.   Ct Chest Wo Contrast  08/17/2011  *RADIOLOGY REPORT*  Clinical Data: Acute renal failure.  Positive ANA.  Evaluate for interstitial lung disease.  CT CHEST WITHOUT CONTRAST  Technique:  Multidetector CT imaging of the chest was performed following the standard protocol without IV contrast.  Comparison: Chest CT 06/20/2007.  Findings:  Mediastinum: Heart size is normal. There is no significant pericardial fluid, thickening or pericardial calcification. There is atherosclerosis of the thoracic aorta, the great vessels of the mediastinum and the coronary arteries, including calcified atherosclerotic plaque in the left main, left anterior descending, left circumflex and right coronary arteries. Calcifications of the mitral subvalvular apparatus (likely the papillary tips). Mildly enlarged right paratracheal lymph node demonstrates a normal appearing fatty hilum (11 mm), this is nonspecific but favored to be reactive.  No other pathologically enlarged mediastinal or hilar lymph nodes. Please note that accurate exclusion of hilar adenopathy is limited on noncontrast CT scans. There is a small hiatal hernia.  Lungs/Pleura: The examination was not performed as a high resolution CT of the thorax which limits assessment for interstitial lung disease.  There are patchy bilateral areas of ground-glass attenuation which are most pronounced in the perihilar regions, with a slight upper lung predominance.  In addition, there appeared be some regions of mild bronchial wall thickening with mild bronchiectasis and peripheral bronchiolectasis (this is most evident within the left upper lobe from images 13 - 21 of series 3).  There is sparing of the periphery of the lungs, particularly in the subpleural regions.  As well, the lung bases themselves are well preserved.  Thin calcified pleural plaques are noted  dependently within the left hemithorax.  No right-sided pleural plaques are identified.  Upper Abdomen: Status post cholecystectomy.  Musculoskeletal: Old healed fracture of the posterolateral aspect of the right fourth rib. There are no aggressive appearing lytic or blastic lesions noted in the visualized portions of the skeleton.  IMPRESSION: 1.  The predominant pattern in the lungs is one of patchy ground glass attenuation, with a slight perihilar and upper lung predominance.  This is an unusual and nonspecific pattern, but is similar to the distribution of air space disease noted on remote prior CT scan 06/20/2007. The most simple explanation for these findings would be mild interstitial pulmonary edema on today's examination, superimposed upon a background of scarring.  Because of the similar distribution to the prior study, if these findings are infract chronic rather than acute, they would be favored to represent residual scarring from remote episode of  acute interstitial pneumonia (likely in April 2009).  Other differential considerations would include desquamative interstitial pneumonia (DIP) if the patient has a history of smoking, or less likely nonspecific interstitial pneumonia (NSIP).  No cystic lung changes to suggest lymphoid interstitial pneumonia (LIP), and the pattern is not consistent with a usual interstitial pneumonia (UIP) pattern typically seen in cases of idiopathic pulmonary fibrosis. This could be best differentiated by repeat high resolution CT of the thorax after the patient's acute respiratory exacerbation has resolved. 2. Atherosclerosis, including left main and three-vessel coronary artery disease. Please note that although the presence of coronary artery calcium documents the presence of coronary artery disease, the severity of this disease and any potential stenosis cannot be assessed on this non-gated CT examination.  Assessment for potential risk factor modification, dietary  therapy or pharmacologic therapy may be warranted, if clinically indicated. 3. There are calcifications of the mitral subvalvular apparatus. Echocardiographic correlation for evaluation of potential valvular dysfunction may be warranted if clinically indicated. 4. Calcified pleural plaques in the posterior aspect of the left hemithorax, presumably secondary to prior left-sided pleural trauma or infection. 5. Status post cholecystectomy.  Original Report Authenticated By: Florencia Reasons, M.D.   US Renal  08/16/2011  *RADIOLOGY REPORT*  Clinical Data: Chronic kidney disease, elevated creatinine, hypertension  RENAL/URINARY TRACT ULTRASOUND COMPLETE  Comparison:  CT abdomen and pelvis of 10/20/2010  Findings:  Right Kidney:  No hydronephrosis is seen.  The right kidney measures 9.2 cm sagittally.  Left Kidney:  No hydronephrosis is noted.  The left kidney measures 9.2 cm.  Bladder:  The urinary bladder is decompressed and cannot be evaluated.  IMPRESSION: No hydronephrosis.  The urinary bladder is decompressed and cannot be evaluated.  Original Report Authenticated By: Juline Patch, M.D.     Medications:        . albuterol  2 puff Inhalation Q6H  . aspirin EC  81 mg Oral Daily  . clonazePAM  1 mg Oral BID  . feeding supplement (NEPRO CARB STEADY)  237 mL Oral TID BM  . FLUoxetine  20 mg Oral Daily  . sodium chloride  3 mL Intravenous Q12H  . sodium polystyrene  15 g Oral Once  . tiotropium  18 mcg Inhalation Daily   acetaminophen, acetaminophen, albuterol, calcium carbonate, HYDROcodone-acetaminophen, ondansetron, polyethylene glycol, promethazine, senna  Assessment/ Plan:   ARF in setting of ACE inhibitor. Inactive urine sediment and no hydronephrosis. Serologies pending. Creatinine improved today  HTN controlled  Will follow creatinine   LOS: 1 [name] [name] @TODAY @3 :12 PM

## 2011-08-17 NOTE — Progress Notes (Signed)
I have seen and examined this patient. I have discussed with Dr Evelena Peat.  I agree with their findings and plans as documented in their admission note.  Acute Issues 1. Acute Renal Failure, nonoliguric - Recent Avelox use for AE-COPD by PCP - Bland urinalysis. - Improvement of Cr overnite without intervention. - Normocytic Anemia with thrombocytopenia.  No evidence of hemolysis.  - ESR 70 mm/hr, (+) ANA 320:1,  CK 28 - potassium 5.2 - Unremarkable Renal US - - Differential includes :  Prerenal Vs Renal origin:    _Recommendations: - Agree with checking Urine sodium and creatinine for FeNa calculation.   If prerenal FeNa, consider trial of IVFluids.  - Await DS-DNA for SLE - Check CT Chest to look for evidence of intersitial lung disease.  - Hold ACEI - Monitor Renal panel daily.

## 2011-08-17 NOTE — Progress Notes (Signed)
Pt was ambulated on room air. When pt got up to stand for ambulation her oxygen was 98% on room air and HR 118. Then she was walked to the door without oxygen and her O2 saturation was 95% and HR went up to 131 and looked like she was in some distress so pt was assisted back to bed. Will continue to monitor.

## 2011-08-17 NOTE — Progress Notes (Signed)
Family Medicine Teaching Service [facility] Progress Note  Patient name: [name] [name] Medical record number: [id number] Date of birth: 07/06/1950 Age: 61 y.o. Gender: female    LOS: 1 day   Primary Care Provider: No primary provider on file.  Overnight Events:  No change in overall wellness today. Pt still w/ poor po due to food tasting bland. Pt reports drinking several glasses of water and at least one can of soda last night. Pt reports eating very little of her meals. Complaining of LLE calf pain. Urinated x1 this am, "a lot" per pt. That was not recorded as pt flushed urine  Objective: Vital signs in last 24 hours: Temp:  [97.4 F (36.3 C)-98.4 F (36.9 C)] 97.7 F (36.5 C) (05/31 0451) Pulse Rate:  [82-101] 88  (05/31 0451) Resp:  [16-22] 18  (05/31 0451) BP: (114-140)/(49-97) 140/97 mmHg (05/31 0451) SpO2:  [97 %-100 %] 100 % (05/31 0451) Weight:  [248 lb (112.492 kg)-251 lb 15.8 oz (114.3 kg)] 251 lb 15.8 oz (114.3 kg) (05/30 2232)  Wt Readings from Last 3 Encounters:  08/16/11 251 lb 15.8 oz (114.3 kg)     Current Facility-Administered Medications  Medication Dose Route Frequency Provider Last Rate Last Dose  . acetaminophen (TYLENOL) tablet 650 mg  650 mg Oral Q6H PRN Ozella Rocks, MD       Or  . acetaminophen (TYLENOL) suppository 650 mg  650 mg Rectal Q6H PRN Ozella Rocks, MD      . albuterol (PROVENTIL HFA;VENTOLIN HFA) 108 (90 BASE) MCG/ACT inhaler 2 puff  2 puff Inhalation Q6H PRN Ozella Rocks, MD      . albuterol (PROVENTIL HFA;VENTOLIN HFA) 108 (90 BASE) MCG/ACT inhaler 2 puff  2 puff Inhalation Q6H Leighton Roach McDiarmid, MD      . aspirin EC tablet 81 mg  81 mg Oral Daily Ozella Rocks, MD      . calcium carbonate (TUMS - dosed in mg elemental calcium) chewable tablet 400 mg of elemental calcium  2 tablet Oral TID PRN Garnetta Buddy, MD   400 mg of elemental calcium at 08/16/11 2308  . clonazePAM (KLONOPIN) tablet 1 mg  1 mg Oral BID Leighton Roach McDiarmid, MD       . FLUoxetine (PROZAC) capsule 20 mg  20 mg Oral Daily Ozella Rocks, MD      . HYDROcodone-acetaminophen [facility]) 10-325 MG per tablet 1 tablet  1 tablet Oral Q6H PRN Ozella Rocks, MD      . ondansetron (ZOFRAN-ODT) disintegrating tablet 8 mg  8 mg Oral Q8H PRN Garnetta Buddy, MD      . polyethylene glycol (MIRALAX / GLYCOLAX) packet 17 g  17 g Oral Daily PRN Ozella Rocks, MD      . promethazine (PHENERGAN) tablet 25 mg  25 mg Oral Q6H PRN Ozella Rocks, MD      . senna [facility]) tablet 8.6 mg  1 tablet Oral Daily PRN Ozella Rocks, MD      . sodium chloride 0.9 % injection 3 mL  3 mL Intravenous Q12H Ozella Rocks, MD   3 mL at 08/16/11 2233  . sodium polystyrene (KAYEXALATE) 15 GM/60ML suspension 15 g  15 g Oral Once Ozella Rocks, MD   15 g at 08/16/11 2050  . tiotropium (SPIRIVA) inhalation capsule 18 mcg  18 mcg Inhalation Daily Leighton Roach McDiarmid, MD         PE: Gen: NAD, obese HEENT:  MMM CV: RRR Res: normal effort Abd: non-painful to palpation Ext/Musc: 2+ peripheral pulses. Left calf pain w/o appreciable edema beyond admission 2+ edema bilat. Neuro: CN grossly intact  Labs/Studies:  Comprehensive Metabolic Panel:    Component Value Date/Time   NA 135 08/17/2011 0525   K 5.0 08/17/2011 0525   CL 104 08/17/2011 0525   CO2 22 08/17/2011 0525   BUN 52* 08/17/2011 0525   CREATININE 4.01* 08/17/2011 0525   GLUCOSE 80 08/17/2011 0525   CALCIUM 8.7 08/17/2011 0525   AST 24 08/17/2011 0525   ALT 18 08/17/2011 0525   ALKPHOS 80 08/17/2011 0525   BILITOT 0.4 08/17/2011 0525   PROT 6.7 08/17/2011 0525   ALBUMIN 2.6* 08/17/2011 0525    CBC:    Component Value Date/Time   WBC 3.9* 08/17/2011 0520   HGB 9.2* 08/17/2011 0520   HCT 27.5* 08/17/2011 0520   PLT PENDING 08/17/2011 0520   MCV 94.5 08/17/2011 0520   NEUTROABS 2.1 08/17/2011 0520   LYMPHSABS 1.1 08/17/2011 0520   MONOABS 0.6 08/17/2011 0520   EOSABS 0.2 08/17/2011 0520   BASOSABS 0.0 08/17/2011 0520     Haptoglobin: 115  Sed Rate: 70  INR/PT 1.01/13.5   Assessment/Plan:  [name] [name] is a 61 y.o. year old female w/ PMHx of COPD, tobacco use, and anxiety presenting with hyperkalemia, fatigue/weakness and acute renal failure.   1. ARF: etiology unknown but likely mutlifactorial including prenal causes of hypovolemia and hypotension and intrinsic causes. Cr improved this am despite no IVF. UOP unsure as pt emptied bladder in comode and flushed w/o reporting. Reports normal fluid intake but poor appetite. Differential still includes Lupus, Idiopathic HUS TTP, other. Renal following and will f/u w/ recommendations.  - Continue to follow renal input  - Continue Strict I/O  - Insert Catheter - continue to hold Lasix and Lisinopril  - BMET in am  - F/u renal lab orders, DS DNA, and likely peripheral smear and Adam TS   2. Fatigue and weakness: Unsure of etiology though thyroid dysfunction is high on differential. Also considering anemia as this is worse today vs malignancy due to wt loss, h/o tobacco use, and age. No reported GI loss of blood, but still a concern. - Peripheral smear pending - Hemocult pending - TSH pending - CBC tomorrow  3. LE pain. Complaining of unilateral (L) LE pain this am in the calf. No appreciable swelling above baseline edema from admission. Concern for DVT as pt very immobile for 2 days prior to admission - LE doppler  4. FEN/GI: No IVF. HyperK from ARF resolved, after K-aexylate x1. Elevated phos this am to 5.4 and drinking soda - Told nurse to not give pt any more soda - BMET in AM  - renal diet.   5. Psych: Depression. - Cont Prozac  6. Prophylaxis:  - SCD (no heparin at this time due to concern for TTP, and falling platelet count)   7. Disposition: Pending clinical improvement and    LOS 1  Signed: Shelly Flatten, MD Family Medicine Resident PGY-1 [phone] 08/17/2011 8:22 AM

## 2011-08-18 LAB — HIV ANTIBODY (ROUTINE TESTING W REFLEX): HIV: NONREACTIVE

## 2011-08-18 LAB — BASIC METABOLIC PANEL
BUN: 49 mg/dL — ABNORMAL HIGH (ref 6–23)
CO2: 24 mEq/L (ref 19–32)
Chloride: 105 mEq/L (ref 96–112)
Creatinine, Ser: 3.17 mg/dL — ABNORMAL HIGH (ref 0.50–1.10)
Glucose, Bld: 79 mg/dL (ref 70–99)

## 2011-08-18 LAB — GLUCOSE, CAPILLARY: Glucose-Capillary: 215 mg/dL — ABNORMAL HIGH (ref 70–99)

## 2011-08-18 LAB — CBC
HCT: 27.8 % — ABNORMAL LOW (ref 36.0–46.0)
Hemoglobin: 9.2 g/dL — ABNORMAL LOW (ref 12.0–15.0)
MCHC: 33.1 g/dL (ref 30.0–36.0)
MCV: 94.9 fL (ref 78.0–100.0)
RDW: 13.7 % (ref 11.5–15.5)

## 2011-08-18 MED ORDER — PANTOPRAZOLE SODIUM 40 MG PO TBEC
40.0000 mg | DELAYED_RELEASE_TABLET | Freq: Every day | ORAL | Status: DC
  Administered 2011-08-18 – 2011-08-21 (×4): 40 mg via ORAL
  Filled 2011-08-18 (×4): qty 1

## 2011-08-18 MED ORDER — HYDROCODONE-ACETAMINOPHEN 10-325 MG PO TABS
1.0000 | ORAL_TABLET | Freq: Two times a day (BID) | ORAL | Status: DC | PRN
  Administered 2011-08-18: 1 via ORAL
  Filled 2011-08-18: qty 1

## 2011-08-18 MED ORDER — PROMETHAZINE HCL 25 MG PO TABS
25.0000 mg | ORAL_TABLET | Freq: Three times a day (TID) | ORAL | Status: DC | PRN
  Administered 2011-08-18 – 2011-08-20 (×2): 25 mg via ORAL
  Filled 2011-08-18 (×2): qty 1

## 2011-08-18 NOTE — Progress Notes (Signed)
Family Medicine Teaching Service [facility] Progress Note  Patient name: [name] [name] Medical record number: [id number] Date of birth: [birth year] Age: 61 y.o. Gender: female    LOS: 2 days   Primary Care Provider: No primary provider on file.  Overnight Events:  No change in overall wellness today. Patient did eat some food today which she was happy about.   Objective: Vital signs in last 24 hours: Temp:  [97.5 F (36.4 C)-98.2 F (36.8 C)] 98.2 F (36.8 C) (06/01 0943) Pulse Rate:  [64-83] 72  (06/01 0943) Resp:  [16-18] 16  (06/01 0943) BP: (81-127)/(31-64) 99/55 mmHg (06/01 0943) SpO2:  [96 %-100 %] 98 % (06/01 0943) Weight:  [113.8 kg (250 lb 14.1 oz)] 113.8 kg (250 lb 14.1 oz) (05/31 2007)  Wt Readings from Last 3 Encounters:  08/17/11 113.8 kg (250 lb 14.1 oz)     Current Facility-Administered Medications  Medication Dose Route Frequency Provider Last Rate Last Dose  . acetaminophen (TYLENOL) tablet 650 mg  650 mg Oral Q6H PRN Ozella Rocks, MD       Or  . acetaminophen (TYLENOL) suppository 650 mg  650 mg Rectal Q6H PRN Ozella Rocks, MD      . albuterol (PROVENTIL HFA;VENTOLIN HFA) 108 (90 BASE) MCG/ACT inhaler 2 puff  2 puff Inhalation BID Leighton Roach McDiarmid, MD   2 puff at 08/18/11 0848  . albuterol (PROVENTIL HFA;VENTOLIN HFA) 108 (90 BASE) MCG/ACT inhaler 2 puff  2 puff Inhalation Q4H PRN Leighton Roach McDiarmid, MD      . aspirin EC tablet 81 mg  81 mg Oral Daily Ozella Rocks, MD   81 mg at 08/17/11 1032  . calcium carbonate (TUMS - dosed in mg elemental calcium) chewable tablet 400 mg of elemental calcium  2 tablet Oral TID PRN Garnetta Buddy, MD   400 mg of elemental calcium at 08/17/11 1546  . clonazePAM (KLONOPIN) tablet 1 mg  1 mg Oral BID Leighton Roach McDiarmid, MD   1 mg at 08/17/11 2120  . feeding supplement (NEPRO CARB STEADY) liquid 237 mL  237 mL Oral TID BM Elyse A Shearer, RD   237 mL at 08/17/11 2005  . FLUoxetine (PROZAC) capsule 20 mg  20 mg Oral Daily  Ozella Rocks, MD   20 mg at 08/17/11 1032  . HYDROcodone-acetaminophen (NORCO) 10-325 MG per tablet 1 tablet  1 tablet Oral BID PRN Judi Saa, DO      . ondansetron (ZOFRAN-ODT) disintegrating tablet 8 mg  8 mg Oral Q8H PRN Garnetta Buddy, MD      . pantoprazole (PROTONIX) EC tablet 40 mg  40 mg Oral Q1200 Sanjuana Letters, MD      . polyethylene glycol (MIRALAX / GLYCOLAX) packet 17 g  17 g Oral Daily PRN Ozella Rocks, MD      . promethazine (PHENERGAN) tablet 25 mg  25 mg Oral Q8H PRN Judi Saa, DO      . senna (SENOKOT) tablet 8.6 mg  1 tablet Oral Daily PRN Ozella Rocks, MD      . simethicone [facility]) chewable tablet 80 mg  80 mg Oral Q6H PRN Tito Dine, MD   80 mg at 08/17/11 1826  . sodium chloride 0.9 % injection 3 mL  3 mL Intravenous Q12H Ozella Rocks, MD   3 mL at 08/17/11 2121  . tiotropium (SPIRIVA) inhalation capsule 18 mcg  18 mcg Inhalation Daily Leighton Roach McDiarmid, MD  18 mcg at 08/18/11 0859  . DISCONTD: albuterol (PROVENTIL HFA;VENTOLIN HFA) 108 (90 BASE) MCG/ACT inhaler 2 puff  2 puff Inhalation Q6H PRN Ozella Rocks, MD      . DISCONTD: albuterol (PROVENTIL HFA;VENTOLIN HFA) 108 (90 BASE) MCG/ACT inhaler 2 puff  2 puff Inhalation Q6H Leighton Roach McDiarmid, MD   2 puff at 08/17/11 1937  . DISCONTD: HYDROcodone-acetaminophen (NORCO) 10-325 MG per tablet 1 tablet  1 tablet Oral Q6H PRN Ozella Rocks, MD      . DISCONTD: promethazine (PHENERGAN) tablet 25 mg  25 mg Oral Q6H PRN Ozella Rocks, MD         PE: Gen: NAD, obese HEENT: MMM CV: RRR Res: normal effort, CTAB Abd: non-painful to palpation Ext/Musc: 2+ peripheral pulses. . Neuro: CN grossly intact, patient though does have some slow mentation  Labs/Studies:  Comprehensive Metabolic Panel:    Component Value Date/Time   NA 139 08/18/2011 0525   K 4.9 08/18/2011 0525   CL 105 08/18/2011 0525   CO2 24 08/18/2011 0525   BUN 49* 08/18/2011 0525   CREATININE 3.17* 08/18/2011 0525    GLUCOSE 79 08/18/2011 0525   CALCIUM 8.9 08/18/2011 0525   AST 24 08/17/2011 0525   ALT 18 08/17/2011 0525   ALKPHOS 80 08/17/2011 0525   BILITOT 0.4 08/17/2011 0525   PROT 6.7 08/17/2011 0525   ALBUMIN 2.6* 08/17/2011 0525    CBC:    Component Value Date/Time   WBC 3.5* 08/18/2011 0525   HGB 9.2* 08/18/2011 0525   HCT 27.8* 08/18/2011 0525   PLT 108* 08/18/2011 0525   MCV 94.9 08/18/2011 0525   NEUTROABS 2.1 08/17/2011 0520   LYMPHSABS 1.1 08/17/2011 0520   MONOABS 0.6 08/17/2011 0520   EOSABS 0.2 08/17/2011 0520   BASOSABS 0.0 08/17/2011 0520    Haptoglobin: 115  Sed Rate: 70  INR/PT 1.01/13.5   Assessment/Plan:  Karah Caruthers is a 61 y.o. year old female w/ PMHx of COPD, tobacco use, and anxiety presenting with hyperkalemia, fatigue/weakness and acute renal failure.   1. ARF: etiology unknown but likely mutlifactorial including prenal causes of hypovolemia and hypotension and intrinsic causes. Cr improved this am again with good PO intake. Reports normal fluid intake but poor appetite. Differential still includes Lupus, Idiopathic HUS TTP, other. Renal following and will f/u w/ recommendations.  - Continue to follow renal recs - Continue Strict I/O with foley - continue to hold Lasix and Lisinopril  - BMET in am  - F/u renal lab orders, DS DNA,  Adam TS   2. Fatigue and weakness: Unsure of etiology likely multifactorial. Hgb continues to fall slowly, but seems to be stabilizing.   Also consider malignancy due to wt loss, h/o tobacco use, and age. No reported GI loss of blood, but still a concern. - Peripheral smear normochromic normocytic - Hemocult negative - TSH normal - CBC will be continued followed.  -elevated Sed rate  3.CT Chest findings: Concern for auto immune, shows interstitial pattern, pulm following Will address on Monday with CXR and potential bronchoscopy.    4. FEN/GI: No IVF. HyperK from ARF resolved, after K-aexylate x1. Elevated phos this am to 5.4 and drinking  soda - Told nurse to not give pt any more soda - BMET in AM  - renal diet.   5. Psych: Depression. - Cont Prozac  6. Prophylaxis:  - SCD (no heparin at this time due to concern for TTP, and falling platelet count) PLT has  stabilized last 2 days.   7. Disposition: Pending clinical improvement and    LOS 2  Signed: Antoine Primas, MD Family Medicine Resident PGY-1 [phone] 08/18/2011 9:48 AM

## 2011-08-18 NOTE — Progress Notes (Signed)
Patient ID: Reneisha Stilley, female   DOB: 11-03-1950, 61 y.o.   MRN: [id number] Feels slightly better.  Heart burn is a major problem.  Has hx of >100 lb weight loss over last year due to food not tasting good.  Also has hx of swallowing dysfunction with food often getting stuck midway down esophagus.   Issues:  Acute renal failure on chronic renal failure.  Creat slowly improving with holding ACE CAN WE DC foley?  Etiology? Renal (not pre or post)  Collagen vascular disease?  From old hypertension?  Weight loss.  Likely some related to renal failure.  Also sounds like she has significant GERD and at least esophageal dysmotility if not a Chotsky's ring.  Will discuss with residents GI referral for possible EGD.  Lung injury.  Appreciate Pulm consult.  She does not endorse any choking with food or drink ("it just gets stuck")  Also in the differential dx could be silent aspiration.  Consider modified barium swallow.    Dispo - she has been stuck in bed with foley and ICDs.  Needs to improve and we need to start ambulating.

## 2011-08-18 NOTE — Progress Notes (Signed)
[facility] ROUNDING NOTE   Subjective:   Interval History: none.  Objective:  Vital signs in last 24 hours:  Temp:  [97.5 F (36.4 C)-98.2 F (36.8 C)] 98.2 F (36.8 C) (06/01 0943) Pulse Rate:  [64-83] 72  (06/01 0943) Resp:  [16-18] 16  (06/01 0943) BP: (81-127)/(31-64) 99/55 mmHg (06/01 0943) SpO2:  [96 %-100 %] 98 % (06/01 0943) Weight:  [113.8 kg (250 lb 14.1 oz)] 113.8 kg (250 lb 14.1 oz) (05/31 2007)  Weight change: 1.308 kg (2 lb 14.1 oz) Filed Weights   08/16/11 1419 08/16/11 2232 08/17/11 2007  Weight: 112.492 kg (248 lb) 114.3 kg (251 lb 15.8 oz) 113.8 kg (250 lb 14.1 oz)    Intake/Output: I/O last 3 completed shifts: In: 785 [P.O.:542; I.V.:3; Other:240] Out: 1000 [Urine:1000]   Intake/Output this shift:     CVS- RRR RS- CTA ABD- BS present soft non-distended EXT- no edema   Basic Metabolic Panel:  Lab 08/18/11 7829 08/17/11 1726 08/17/11 0525 08/16/11 1630  NA 139 136 135 135  K 4.9 4.5 5.0 5.2*  CL 105 104 104 101  CO2 24 23 22 22   GLUCOSE 79 87 80 95  BUN 49* 50* 52* 56*  CREATININE 3.17* 3.34* 4.01* 4.75*  CALCIUM 8.9 9.0 8.7 --  MG -- -- -- 2.1  PHOS -- -- 5.4* --    Liver Function Tests:  Lab 08/17/11 0525 08/16/11 1630  AST 24 26  ALT 18 23  ALKPHOS 80 92  BILITOT 0.4 0.4  PROT 6.7 7.8  ALBUMIN 2.6* 3.0*   No results found for this basename: LIPASE:5,AMYLASE:5 in the last 168 hours No results found for this basename: AMMONIA:3 in the last 168 hours  CBC:  Lab 08/18/11 0525 08/17/11 0520 08/16/11 1630  WBC 3.5* 3.9* 4.4  NEUTROABS -- 2.1 2.5  HGB 9.2* 9.2* 10.6*  HCT 27.8* 27.5* 32.2*  MCV 94.9 94.5 95.3  PLT 108* 109* 124*    Cardiac Enzymes:  Lab 08/17/11 1110  CKTOTAL 21  CKMB --  CKMBINDEX --  TROPONINI --    BNP: No components found with this basename: POCBNP:5  CBG: No results found for this basename: GLUCAP:5 in the last 168 hours  Microbiology: Results for orders placed during the  hospital encounter of 06/19/07  URINE CULTURE     Status: Normal   Collection Time   06/19/07  6:37 PM      Component Value Range Status Comment   Specimen Description URINE, RANDOM   Final    Special Requests CX ADDED AT 0212   Final    Colony Count 1,000 COLONIES/ML   Final    Culture INSIGNIFICANT GROWTH   Final    Report Status 06/21/2007 FINAL   Final   URINE CULTURE     Status: Normal   Collection Time   06/21/07  2:21 PM      Component Value Range Status Comment   Specimen Description URINE, CATHETERIZED   Final    Special Requests NONE   Final    Colony Count NO GROWTH   Final    Culture NO GROWTH   Final    Report Status 06/23/2007 FINAL   Final     Coagulation Studies:  Basename 08/16/11 2026  LABPROT 13.5  INR 1.01    Urinalysis:  Basename 08/16/11 1657 08/16/11 1459  COLORURINE YELLOW YELLOW  LABSPEC 1.013 1.017  PHURINE 5.0 5.0  GLUCOSEU NEGATIVE NEGATIVE  HGBUR NEGATIVE NEGATIVE  BILIRUBINUR NEGATIVE  SMALL*  KETONESUR NEGATIVE NEGATIVE  PROTEINUR NEGATIVE NEGATIVE  UROBILINOGEN 0.2 0.2  NITRITE NEGATIVE NEGATIVE  LEUKOCYTESUR NEGATIVE LARGE*      Imaging: Dg Chest 2 View  08/16/2011  *RADIOLOGY REPORT*  Clinical Data: Weakness and swelling overload.  Short of breath  CHEST - 2 VIEW  Comparison: Chest radiograph 09/02/2009  Findings: Normal cardiac silhouette.  There is a fine perihilar air space disease which is new from prior.  No pleural fluid.  No pneumothorax.  IMPRESSION: Fine perihilar air space disease suggests mild pulmonary edema/interstitial edema versus less likely atypical infection.  Original Report Authenticated By: Genevive Bi, M.D.   Ct Head Wo Contrast  08/16/2011  *RADIOLOGY REPORT*  Clinical Data: Generalized weakness.  Multiple falls.  CT HEAD WITHOUT CONTRAST  Technique:  Contiguous axial images were obtained from the base of the skull through the vertex without contrast.  Comparison: None.  Findings: There is no evidence for acute  infarction, intracranial hemorrhage, mass lesion, hydrocephalus, or extra-axial fluid.  No significant atrophy or white matter disease.  Intact calvarium.  No sinus or mastoid disease.  Negative orbits.  IMPRESSION: Unremarkable cranial CT.  Original Report Authenticated By: Elsie Stain, M.D.   Ct Chest Wo Contrast  08/17/2011  *RADIOLOGY REPORT*  Clinical Data: Acute renal failure.  Positive ANA.  Evaluate for interstitial lung disease.  CT CHEST WITHOUT CONTRAST  Technique:  Multidetector CT imaging of the chest was performed following the standard protocol without IV contrast.  Comparison: Chest CT 06/20/2007.  Findings:  Mediastinum: Heart size is normal. There is no significant pericardial fluid, thickening or pericardial calcification. There is atherosclerosis of the thoracic aorta, the great vessels of the mediastinum and the coronary arteries, including calcified atherosclerotic plaque in the left main, left anterior descending, left circumflex and right coronary arteries. Calcifications of the mitral subvalvular apparatus (likely the papillary tips). Mildly enlarged right paratracheal lymph node demonstrates a normal appearing fatty hilum (11 mm), this is nonspecific but favored to be reactive.  No other pathologically enlarged mediastinal or hilar lymph nodes. Please note that accurate exclusion of hilar adenopathy is limited on noncontrast CT scans. There is a small hiatal hernia.  Lungs/Pleura: The examination was not performed as a high resolution CT of the thorax which limits assessment for interstitial lung disease.  There are patchy bilateral areas of ground-glass attenuation which are most pronounced in the perihilar regions, with a slight upper lung predominance.  In addition, there appeared be some regions of mild bronchial wall thickening with mild bronchiectasis and peripheral bronchiolectasis (this is most evident within the left upper lobe from images 13 - 21 of series 3).  There is  sparing of the periphery of the lungs, particularly in the subpleural regions.  As well, the lung bases themselves are well preserved.  Thin calcified pleural plaques are noted dependently within the left hemithorax.  No right-sided pleural plaques are identified.  Upper Abdomen: Status post cholecystectomy.  Musculoskeletal: Old healed fracture of the posterolateral aspect of the right fourth rib. There are no aggressive appearing lytic or blastic lesions noted in the visualized portions of the skeleton.  IMPRESSION: 1.  The predominant pattern in the lungs is one of patchy ground glass attenuation, with a slight perihilar and upper lung predominance.  This is an unusual and nonspecific pattern, but is similar to the distribution of air space disease noted on remote prior CT scan 06/20/2007. The most simple explanation for these findings would be mild interstitial pulmonary edema  on today's examination, superimposed upon a background of scarring.  Because of the similar distribution to the prior study, if these findings are infract chronic rather than acute, they would be favored to represent residual scarring from remote episode of acute interstitial pneumonia (likely in April 2009).  Other differential considerations would include desquamative interstitial pneumonia (DIP) if the patient has a history of smoking, or less likely nonspecific interstitial pneumonia (NSIP).  No cystic lung changes to suggest lymphoid interstitial pneumonia (LIP), and the pattern is not consistent with a usual interstitial pneumonia (UIP) pattern typically seen in cases of idiopathic pulmonary fibrosis. This could be best differentiated by repeat high resolution CT of the thorax after the patient's acute respiratory exacerbation has resolved. 2. Atherosclerosis, including left main and three-vessel coronary artery disease. Please note that although the presence of coronary artery calcium documents the presence of coronary artery  disease, the severity of this disease and any potential stenosis cannot be assessed on this non-gated CT examination.  Assessment for potential risk factor modification, dietary therapy or pharmacologic therapy may be warranted, if clinically indicated. 3. There are calcifications of the mitral subvalvular apparatus. Echocardiographic correlation for evaluation of potential valvular dysfunction may be warranted if clinically indicated. 4. Calcified pleural plaques in the posterior aspect of the left hemithorax, presumably secondary to prior left-sided pleural trauma or infection. 5. Status post cholecystectomy.  Original Report Authenticated By: Florencia Reasons, M.D.   US Renal  08/16/2011  *RADIOLOGY REPORT*  Clinical Data: Chronic kidney disease, elevated creatinine, hypertension  RENAL/URINARY TRACT ULTRASOUND COMPLETE  Comparison:  CT abdomen and pelvis of 10/20/2010  Findings:  Right Kidney:  No hydronephrosis is seen.  The right kidney measures 9.2 cm sagittally.  Left Kidney:  No hydronephrosis is noted.  The left kidney measures 9.2 cm.  Bladder:  The urinary bladder is decompressed and cannot be evaluated.  IMPRESSION: No hydronephrosis.  The urinary bladder is decompressed and cannot be evaluated.  Original Report Authenticated By: Juline Patch, M.D.     Medications:        . albuterol  2 puff Inhalation BID  . aspirin EC  81 mg Oral Daily  . clonazePAM  1 mg Oral BID  . feeding supplement (NEPRO CARB STEADY)  237 mL Oral TID BM  . FLUoxetine  20 mg Oral Daily  . pantoprazole  40 mg Oral Q1200  . sodium chloride  3 mL Intravenous Q12H  . tiotropium  18 mcg Inhalation Daily  . DISCONTD: albuterol  2 puff Inhalation Q6H   acetaminophen, acetaminophen, albuterol, calcium carbonate, HYDROcodone-acetaminophen, ondansetron, polyethylene glycol, promethazine, senna, simethicone, DISCONTD: albuterol, DISCONTD: HYDROcodone-acetaminophen, DISCONTD: promethazine  Assessment/ Plan:  62 yo  WF sent to ER Because of rise in her Scr to 4.75. Her Scr in 8/12 was .87 and in 2/13, 1.5 and in 4/13 1.47  ARF in setting of ACE inhibitor. Inactive urine sediment and no hydronephrosis. Serologies pending. Creatinine improved today   HTN controlled  Will follow creatinine   LOS: 2 Kemper Hochman W @TODAY @10 :09 AM

## 2011-08-18 NOTE — Consult Note (Signed)
Referring Provider: Dr. Leveda Anna Primary Care Physician:  No primary provider on file. Primary Gastroenterologist:  Gentry Fitz  Reason for Consultation:  Dysphagia, weight loss  HPI: [name] [name] is a 61 y.o. female admitted for weakness and acute renal failure being seen as a consult due to dysphagia and weight loss. She reports years of trouble swallowing bread, rice, and cornbread and when it occurs it eventually passes on down and she can finish her meal. Denies any trouble swallowing liquids and does not endorse trouble swallowing meats but states that she does not eat meat without almost mincing the meat. Reports chronic heartburn that she takes Gas-X for stating that it takes a "whole box of Prilosec" to resolve the heartburn. Poor appetite. Reports "116 pound" unintentional weight loss over the past 18 months. Food has lost its taste for her stating it taste like "wood." + nausea without vomiting. Reports occasional abdominal pain but denies any right now. She is not sure when her last BM was and denies having one yesterday that was reported by nursing. Denies ever having an endoscopy. Heme negative per family medicine team.  Past Medical History  Diagnosis Date  . Anxiety   . COPD (chronic obstructive pulmonary disease)   . Fluid retention   . Acute renal failure   . Shortness of breath   . Dizziness   . Angina   . Degenerative disorder of eye     Right eye    Past Surgical History  Procedure Date  . Cholecystectomy   . Knee surgery     Prior to Admission medications   Medication Sig Start Date End Date Taking? Authorizing Provider  albuterol (PROVENTIL HFA;VENTOLIN HFA) 108 (90 BASE) MCG/ACT inhaler Inhale 2 puffs into the lungs every 6 (six) hours as needed. For shortness of breath   Yes Historical Provider, MD  citalopram (CELEXA) 20 MG tablet Take 20 mg by mouth daily.   Yes Historical Provider, MD  FLUoxetine (PROZAC) 20 MG capsule Take 20 mg by mouth daily.   Yes  Historical Provider, MD  furosemide (LASIX) 40 MG tablet Take 40 mg by mouth daily.   Yes Historical Provider, MD  HYDROcodone-acetaminophen (NORCO) 10-325 MG per tablet Take 1 tablet by mouth every 6 (six) hours as needed. For pain   Yes Historical Provider, MD  promethazine (PHENERGAN) 25 MG tablet Take 25 mg by mouth every 6 (six) hours as needed. For nausea   Yes Historical Provider, MD    Scheduled Meds:   . albuterol  2 puff Inhalation BID  . aspirin EC  81 mg Oral Daily  . clonazePAM  1 mg Oral BID  . feeding supplement (NEPRO CARB STEADY)  237 mL Oral TID BM  . FLUoxetine  20 mg Oral Daily  . pantoprazole  40 mg Oral Q1200  . sodium chloride  3 mL Intravenous Q12H  . tiotropium  18 mcg Inhalation Daily  . DISCONTD: albuterol  2 puff Inhalation Q6H   Continuous Infusions:  PRN Meds:.acetaminophen, acetaminophen, albuterol, calcium carbonate, HYDROcodone-acetaminophen, ondansetron, polyethylene glycol, promethazine, senna, simethicone, DISCONTD: albuterol, DISCONTD: HYDROcodone-acetaminophen, DISCONTD: promethazine  Allergies as of 08/16/2011  . (No Known Allergies)    Family History  Problem Relation Age of Onset  . Heart failure Mother   . Stroke Father     History   Social History  . Marital Status: Married    Spouse Name: N/A    Number of Children: N/A  . Years of Education: N/A   Occupational History  .  Not on file.   Social History Main Topics  . Smoking status: Former Smoker -- 30 years    Quit date: 03/19/2001  . Smokeless tobacco: Current User    Types: Snuff  . Alcohol Use: No  . Drug Use: No  . Sexually Active:    Other Topics Concern  . Not on file   Social History Narrative  . No narrative on file    Review of Systems: All negative except as stated above in HPI.  Physical Exam: Vital signs: Filed Vitals:   08/18/11 1758  BP: 90/51  Pulse: 78  Temp: 98.2 F (36.8 C)  Resp: 20   Last BM Date: 08/15/11 General:   Alert,  Obese,  pleasant and cooperative in NAD Lungs:  Clear throughout to auscultation anteriorly   Heart:  Regular rate and rhythm Abdomen: diffusely tender with voluntary guarding, soft, nondistended, positive bowel sounds  Rectal:  Deferred  GI:  Lab Results:  Basename 08/18/11 0525 08/17/11 0520 08/16/11 1630  WBC 3.5* 3.9* 4.4  HGB 9.2* 9.2* 10.6*  HCT 27.8* 27.5* 32.2*  PLT 108* 109* 124*   BMET  Basename 08/18/11 0525 08/17/11 1726 08/17/11 0525  NA 139 136 135  K 4.9 4.5 5.0  CL 105 104 104  CO2 24 23 22   GLUCOSE 79 87 80  BUN 49* 50* 52*  CREATININE 3.17* 3.34* 4.01*  CALCIUM 8.9 9.0 8.7   LFT  Basename 08/17/11 0525  PROT 6.7  ALBUMIN 2.6*  AST 24  ALT 18  ALKPHOS 80  BILITOT 0.4  BILIDIR --  IBILI --   PT/INR  Basename 08/16/11 2026  LABPROT 13.5  INR 1.01     Studies/Results: Ct Chest Wo Contrast  08/17/2011  *RADIOLOGY REPORT*  Clinical Data: Acute renal failure.  Positive ANA.  Evaluate for interstitial lung disease.  CT CHEST WITHOUT CONTRAST  Technique:  Multidetector CT imaging of the chest was performed following the standard protocol without IV contrast.  Comparison: Chest CT 06/20/2007.  Findings:  Mediastinum: Heart size is normal. There is no significant pericardial fluid, thickening or pericardial calcification. There is atherosclerosis of the thoracic aorta, the great vessels of the mediastinum and the coronary arteries, including calcified atherosclerotic plaque in the left main, left anterior descending, left circumflex and right coronary arteries. Calcifications of the mitral subvalvular apparatus (likely the papillary tips). Mildly enlarged right paratracheal lymph node demonstrates a normal appearing fatty hilum (11 mm), this is nonspecific but favored to be reactive.  No other pathologically enlarged mediastinal or hilar lymph nodes. Please note that accurate exclusion of hilar adenopathy is limited on noncontrast CT scans. There is a small hiatal  hernia.  Lungs/Pleura: The examination was not performed as a high resolution CT of the thorax which limits assessment for interstitial lung disease.  There are patchy bilateral areas of ground-glass attenuation which are most pronounced in the perihilar regions, with a slight upper lung predominance.  In addition, there appeared be some regions of mild bronchial wall thickening with mild bronchiectasis and peripheral bronchiolectasis (this is most evident within the left upper lobe from images 13 - 21 of series 3).  There is sparing of the periphery of the lungs, particularly in the subpleural regions.  As well, the lung bases themselves are well preserved.  Thin calcified pleural plaques are noted dependently within the left hemithorax.  No right-sided pleural plaques are identified.  Upper Abdomen: Status post cholecystectomy.  Musculoskeletal: Old healed fracture of the posterolateral aspect of  the right fourth rib. There are no aggressive appearing lytic or blastic lesions noted in the visualized portions of the skeleton.  IMPRESSION: 1.  The predominant pattern in the lungs is one of patchy ground glass attenuation, with a slight perihilar and upper lung predominance.  This is an unusual and nonspecific pattern, but is similar to the distribution of air space disease noted on remote prior CT scan 06/20/2007. The most simple explanation for these findings would be mild interstitial pulmonary edema on today's examination, superimposed upon a background of scarring.  Because of the similar distribution to the prior study, if these findings are infract chronic rather than acute, they would be favored to represent residual scarring from remote episode of acute interstitial pneumonia (likely in April 2009).  Other differential considerations would include desquamative interstitial pneumonia (DIP) if the patient has a history of smoking, or less likely nonspecific interstitial pneumonia (NSIP).  No cystic lung  changes to suggest lymphoid interstitial pneumonia (LIP), and the pattern is not consistent with a usual interstitial pneumonia (UIP) pattern typically seen in cases of idiopathic pulmonary fibrosis. This could be best differentiated by repeat high resolution CT of the thorax after the patient's acute respiratory exacerbation has resolved. 2. Atherosclerosis, including left main and three-vessel coronary artery disease. Please note that although the presence of coronary artery calcium documents the presence of coronary artery disease, the severity of this disease and any potential stenosis cannot be assessed on this non-gated CT examination.  Assessment for potential risk factor modification, dietary therapy or pharmacologic therapy may be warranted, if clinically indicated. 3. There are calcifications of the mitral subvalvular apparatus. Echocardiographic correlation for evaluation of potential valvular dysfunction may be warranted if clinically indicated. 4. Calcified pleural plaques in the posterior aspect of the left hemithorax, presumably secondary to prior left-sided pleural trauma or infection. 5. Status post cholecystectomy.  Original Report Authenticated By: Florencia Reasons, M.D.    Impression/Plan: 60yo with significant unintentional weight loss in past 1.5 years and several years of dysphagia to breads and rice. 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 Doubt an esophageal malignancy. Needs an EGD with possible dilation during this hospitalization to further evaluate and will plan to do Monday. NPO p MN Sunday.   LOS: 2 days   Eyleen Rawlinson C.  08/18/2011, 6:54 PM

## 2011-08-19 LAB — CBC
HCT: 31 % — ABNORMAL LOW (ref 36.0–46.0)
HCT: 28.6 % — ABNORMAL LOW (ref 36.0–46.0)
MCH: 31.1 pg (ref 26.0–34.0)
MCHC: 33.2 g/dL (ref 30.0–36.0)
MCV: 95.4 fL (ref 78.0–100.0)
MCV: 95.3 fL (ref 78.0–100.0)
Platelets: 118 10*3/uL — ABNORMAL LOW (ref 150–400)
Platelets: 99 10*3/uL — ABNORMAL LOW (ref 150–400)
RBC: 3.25 MIL/uL — ABNORMAL LOW (ref 3.87–5.11)
RDW: 13.7 % (ref 11.5–15.5)
RDW: 13.6 % (ref 11.5–15.5)
WBC: 3.8 10*3/uL — ABNORMAL LOW (ref 4.0–10.5)

## 2011-08-19 LAB — BASIC METABOLIC PANEL
BUN: 43 mg/dL — ABNORMAL HIGH (ref 6–23)
CO2: 24 mEq/L (ref 19–32)
CO2: 24 mEq/L (ref 19–32)
Calcium: 8.8 mg/dL (ref 8.4–10.5)
Calcium: 8.3 mg/dL — ABNORMAL LOW (ref 8.4–10.5)
Chloride: 105 mEq/L (ref 96–112)
Chloride: 106 mEq/L (ref 96–112)
Creatinine, Ser: 2.4 mg/dL — ABNORMAL HIGH (ref 0.50–1.10)
Creatinine, Ser: 2.18 mg/dL — ABNORMAL HIGH (ref 0.50–1.10)
Glucose, Bld: 85 mg/dL (ref 70–99)

## 2011-08-19 NOTE — Progress Notes (Addendum)
Patient does not have diet ordered notified on call physician for Doctor McDiarmid. Dr. Denyse Amass came and saw patient and requested that RN call Dr. Bosie Clos. Dr. Bosie Clos is ok with patient being on diet until Sunday when scheduled for npo and referred back to Attending. Repaged attending waiting for call.

## 2011-08-19 NOTE — Progress Notes (Signed)
Seen and examined.  Discussed with Dr. Denyse Amass.  Agree with his management.  Her main reason for hospitalization, acute on chronic renal failure is resolving.  Lots of lingering issues.  Low BP is one of them along with marked deconditioning.    Appreciate GI help.  I will focus on getting her moving.  DC foley and heart monitor.  Up in chair, PT to see.  Dispo? Aim for DC on Tues 6/4 pending PT eval and endo results.

## 2011-08-19 NOTE — Progress Notes (Signed)
Interim:  In the interim gastroenterology was consult. They believe that some of her dysphagia symptoms may be due to an esophageal ring. The wrote for n.p.o. and plan for an EGD on Monday.    Subjective: Currently feels well without any fevers chills abdominal pain nausea vomiting or diarrhea. She continues to have a morning cough but otherwise is doing well.  She would like to eat today if possible.  Objective: Vital signs in last 24 hours: Temp:  [97.6 F (36.4 C)-98.2 F (36.8 C)] 97.8 F (36.6 C) (06/02 0525) Pulse Rate:  [64-89] 64  (06/02 0525) Resp:  [16-20] 19  (06/02 0525) BP: (78-99)/(44-60) 86/60 mmHg (06/02 0525) SpO2:  [93 %-98 %] 93 % (06/02 0525) Weight:  [254 lb (115.214 kg)] 254 lb (115.214 kg) (06/01 2208) Weight change: 3 lb 1.9 oz (1.414 kg) Last BM Date: 08/15/11  Intake/Output from previous day: 06/01 0701 - 06/02 0700 In: 480 [P.O.:480] Out: 450 [Urine:450] Intake/Output this shift:    Exam:  BP 86/60  Pulse 64  Temp(Src) 97.8 F (36.6 C) (Oral)  Resp 19  Ht 5\' 7"  (1.702 m)  Wt 254 lb (115.214 kg)  BMI 39.78 kg/m2  SpO2 93% Gen: NAD, obese  HEENT: MMM  CV: RRR  Res: normal effort, CTAB  Abd: non-painful to palpation  Ext/Musc: 2+ peripheral pulses Non edematous BL  LE, warm and well perfused.    Lab Results:  Basename 08/18/11 0525 08/17/11 0520  WBC 3.5* 3.9*  HGB 9.2* 9.2*  HCT 27.8* 27.5*  PLT 108* 109*   BMET  Basename 08/18/11 0525 08/17/11 1726  NA 139 136  K 4.9 4.5  CL 105 104  CO2 24 23  GLUCOSE 79 87  BUN 49* 50*  CREATININE 3.17* 3.34*  CALCIUM 8.9 9.0    Studies/Results: Ct Chest Wo Contrast  08/17/2011  *RADIOLOGY REPORT*  Clinical Data: Acute renal failure.  Positive ANA.  Evaluate for interstitial lung disease.  CT CHEST WITHOUT CONTRAST  Technique:  Multidetector CT imaging of the chest was performed following the standard protocol without IV contrast.  Comparison: Chest CT 06/20/2007.  Findings:  Mediastinum:  Heart size is normal. There is no significant pericardial fluid, thickening or pericardial calcification. There is atherosclerosis of the thoracic aorta, the great vessels of the mediastinum and the coronary arteries, including calcified atherosclerotic plaque in the left main, left anterior descending, left circumflex and right coronary arteries. Calcifications of the mitral subvalvular apparatus (likely the papillary tips). Mildly enlarged right paratracheal lymph node demonstrates a normal appearing fatty hilum (11 mm), this is nonspecific but favored to be reactive.  No other pathologically enlarged mediastinal or hilar lymph nodes. Please note that accurate exclusion of hilar adenopathy is limited on noncontrast CT scans. There is a small hiatal hernia.  Lungs/Pleura: The examination was not performed as a high resolution CT of the thorax which limits assessment for interstitial lung disease.  There are patchy bilateral areas of ground-glass attenuation which are most pronounced in the perihilar regions, with a slight upper lung predominance.  In addition, there appeared be some regions of mild bronchial wall thickening with mild bronchiectasis and peripheral bronchiolectasis (this is most evident within the left upper lobe from images 13 - 21 of series 3).  There is sparing of the periphery of the lungs, particularly in the subpleural regions.  As well, the lung bases themselves are well preserved.  Thin calcified pleural plaques are noted dependently within the left hemithorax.  No right-sided  pleural plaques are identified.  Upper Abdomen: Status post cholecystectomy.  Musculoskeletal: Old healed fracture of the posterolateral aspect of the right fourth rib. There are no aggressive appearing lytic or blastic lesions noted in the visualized portions of the skeleton.  IMPRESSION: 1.  The predominant pattern in the lungs is one of patchy ground glass attenuation, with a slight perihilar and upper lung  predominance.  This is an unusual and nonspecific pattern, but is similar to the distribution of air space disease noted on remote prior CT scan 06/20/2007. The most simple explanation for these findings would be mild interstitial pulmonary edema on today's examination, superimposed upon a background of scarring.  Because of the similar distribution to the prior study, if these findings are infract chronic rather than acute, they would be favored to represent residual scarring from remote episode of acute interstitial pneumonia (likely in April 2009).  Other differential considerations would include desquamative interstitial pneumonia (DIP) if the patient has a history of smoking, or less likely nonspecific interstitial pneumonia (NSIP).  No cystic lung changes to suggest lymphoid interstitial pneumonia (LIP), and the pattern is not consistent with a usual interstitial pneumonia (UIP) pattern typically seen in cases of idiopathic pulmonary fibrosis. This could be best differentiated by repeat high resolution CT of the thorax after the patient's acute respiratory exacerbation has resolved. 2. Atherosclerosis, including left main and three-vessel coronary artery disease. Please note that although the presence of coronary artery calcium documents the presence of coronary artery disease, the severity of this disease and any potential stenosis cannot be assessed on this non-gated CT examination.  Assessment for potential risk factor modification, dietary therapy or pharmacologic therapy may be warranted, if clinically indicated. 3. There are calcifications of the mitral subvalvular apparatus. Echocardiographic correlation for evaluation of potential valvular dysfunction may be warranted if clinically indicated. 4. Calcified pleural plaques in the posterior aspect of the left hemithorax, presumably secondary to prior left-sided pleural trauma or infection. 5. Status post cholecystectomy.  Original Report Authenticated By:  Florencia Reasons, M.D.    Medications:  I have reviewed the patient's current medications. Scheduled:   . albuterol  2 puff Inhalation BID  . aspirin EC  81 mg Oral Daily  . clonazePAM  1 mg Oral BID  . feeding supplement (NEPRO CARB STEADY)  237 mL Oral TID BM  . FLUoxetine  20 mg Oral Daily  . pantoprazole  40 mg Oral Q1200  . sodium chloride  3 mL Intravenous Q12H  . tiotropium  18 mcg Inhalation Daily   Continuous:  [id number], acetaminophen, albuterol, calcium carbonate, HYDROcodone-acetaminophen, ondansetron, polyethylene glycol, promethazine, senna, simethicone, DISCONTD: HYDROcodone-acetaminophen, DISCONTD: promethazine  Assessment/Plan: [name] [name] is a 61 y.o. year old female w/ PMHx of COPD, tobacco use, and anxiety presenting with hyperkalemia, fatigue/weakness and acute renal failure.   1. ARF: etiology unknown but likely mutlifactorial including prenal causes of hypovolemia and hypotension and intrinsic causes. Cr improved this am again with good PO intake. Reports normal fluid intake but poor appetite. Differential still includes Lupus, Idiopathic HUS TTP, other. Renal following and will f/u w/ recommendations.  - Continue to follow renal recs  - Continue Strict I/O with foley  - continue to hold Lasix and Lisinopril  - BMET in am  - F/u renal lab orders, DS DNA, Adam TS   2. Fatigue and weakness: Unsure of etiology likely multifactorial. Hgb continues to fall slowly, but seems to be stabilizing. Also consider malignancy due to wt loss, h/o tobacco use,  and age. No reported GI loss of blood, but still a concern.  - Peripheral smear normochromic normocytic  - Hemocult negative  - TSH normal  - CBC will be continued followed.  -elevated Sed rate   3. Gastroenterology:  Feel that some decision symptoms may be due to esophageal ring. Plan for EGD Monday, June 3.   4.CT Chest findings: Concern for auto immune, shows interstitial pattern, pulm following    Will address on Monday with CXR and potential bronchoscopy.   5. FEN/GI: No IVF. HyperK from ARF resolved, after K-aexylate x1. Elevated phos this am to 5.4 and drinking soda  - Told nurse to not give pt any more soda  - BMET in AM  - renal diet.   6. Psych: Depression.  - Cont Prozac   7. Prophylaxis:  - SCD (no heparin at this time due to concern for TTP, and falling platelet count) PLT has stabilized  last 2 days.   8. Disposition: Pending clinical improvement and    LOS: 3 days   Kaeden Mester 08/19/2011, 8:32 AM

## 2011-08-19 NOTE — Progress Notes (Signed)
[name] ROUNDING NOTE   Subjective:   Interval History: none.  Objective:  Vital signs in last 24 hours:  Temp:  [97.6 F (36.4 C)-98.2 F (36.8 C)] 97.7 F (36.5 C) (06/02 0908) Pulse Rate:  [64-89] 84  (06/02 0908) Resp:  [19-20] 20  (06/02 0908) BP: (78-90)/(44-60) 84/51 mmHg (06/02 0908) SpO2:  [92 %-98 %] 94 % (06/02 0908) Weight:  [115.214 kg (254 lb)] 115.214 kg (254 lb) (06/01 2208)  Weight change: 1.414 kg (3 lb 1.9 oz) Filed Weights   08/16/11 2232 08/17/11 2007 08/18/11 2208  Weight: 114.3 kg (251 lb 15.8 oz) 113.8 kg (250 lb 14.1 oz) 115.214 kg (254 lb)    Intake/Output: I/O last 3 completed shifts: In: 720 [P.O.:480; Other:240] Out: 850 [Urine:850]   Intake/Output this shift:     CVS- RRR RS- CTA ABD- BS present soft non-distended EXT- no edema   Basic Metabolic Panel:  Lab 08/19/11 4098 08/18/11 0525 08/17/11 1726 08/17/11 0525 08/16/11 1630  NA 139 139 136 135 135  K 4.6 4.9 4.5 5.0 5.2*  CL 105 105 104 104 101  CO2 24 24 23 22 22   GLUCOSE 85 79 87 80 95  BUN 45* 49* 50* 52* 56*  CREATININE 2.40* 3.17* 3.34* 4.01* 4.75*  CALCIUM 8.8 8.9 9.0 -- --  MG -- -- -- -- 2.1  PHOS -- -- -- 5.4* --    Liver Function Tests:  Lab 08/17/11 0525 08/16/11 1630  AST 24 26  ALT 18 23  ALKPHOS 80 92  BILITOT 0.4 0.4  PROT 6.7 7.8  ALBUMIN 2.6* 3.0*   No results found for this basename: LIPASE:5,AMYLASE:5 in the last 168 hours No results found for this basename: AMMONIA:3 in the last 168 hours  CBC:  Lab 08/19/11 0850 08/18/11 0525 08/17/11 0520 08/16/11 1630  WBC 4.4 3.5* 3.9* 4.4  NEUTROABS -- -- 2.1 2.5  HGB 10.1* 9.2* 9.2* 10.6*  HCT 31.0* 27.8* 27.5* 32.2*  MCV 95.4 94.9 94.5 95.3  PLT 118* 108* 109* 124*    Cardiac Enzymes:  Lab 08/17/11 1110  CKTOTAL 21  CKMB --  CKMBINDEX --  TROPONINI --    BNP: No components found with this basename: POCBNP:5  CBG:  Lab 08/18/11 2207  GLUCAP 215*     Microbiology: Results for orders placed during the hospital encounter of 06/19/07  URINE CULTURE     Status: Normal   Collection Time   06/19/07  6:37 PM      Component Value Range Status Comment   Specimen Description URINE, RANDOM   Final    Special Requests CX ADDED AT 0212   Final    Colony Count 1,000 COLONIES/ML   Final    Culture INSIGNIFICANT GROWTH   Final    Report Status 06/21/2007 FINAL   Final   URINE CULTURE     Status: Normal   Collection Time   06/21/07  2:21 PM      Component Value Range Status Comment   Specimen Description URINE, CATHETERIZED   Final    Special Requests NONE   Final    Colony Count NO GROWTH   Final    Culture NO GROWTH   Final    Report Status 06/23/2007 FINAL   Final     Coagulation Studies:  Basename 08/16/11 2026  LABPROT 13.5  INR 1.01    Urinalysis:  Basename 08/16/11 1657 08/16/11 1459  COLORURINE YELLOW YELLOW  LABSPEC 1.013 1.017  PHURINE 5.0  5.0  GLUCOSEU NEGATIVE NEGATIVE  HGBUR NEGATIVE NEGATIVE  BILIRUBINUR NEGATIVE SMALL*  KETONESUR NEGATIVE NEGATIVE  PROTEINUR NEGATIVE NEGATIVE  UROBILINOGEN 0.2 0.2  NITRITE NEGATIVE NEGATIVE  LEUKOCYTESUR NEGATIVE LARGE*      Imaging: No results found.   Medications:        . albuterol  2 puff Inhalation BID  . aspirin EC  81 mg Oral Daily  . clonazePAM  1 mg Oral BID  . feeding supplement (NEPRO CARB STEADY)  237 mL Oral TID BM  . FLUoxetine  20 mg Oral Daily  . pantoprazole  40 mg Oral Q1200  . sodium chloride  3 mL Intravenous Q12H  . tiotropium  18 mcg Inhalation Daily   acetaminophen, acetaminophen, albuterol, calcium carbonate, HYDROcodone-acetaminophen, ondansetron, polyethylene glycol, promethazine, senna, simethicone  Assessment/ Plan:  61 yo WF sent to ER Because of rise in her Scr to 4.75. Her Scr in 8/12 was .87 and in 2/13, 1.5 and in 4/13 1.47   ARF in setting of ACE inhibitor. Inactive urine sediment and no hydronephrosis. Serologies pending.  Creatinine improved today   HTN controlled  Will sign off today discussed with Dr Maida Sale   LOS: 3 [name] [name] W @TODAY @11 :09 AM

## 2011-08-20 ENCOUNTER — Encounter (HOSPITAL_COMMUNITY)
Admission: EM | Disposition: A | Discharge status: Home / Self Care | Payer: Self-pay | Source: Home / Self Care | Attending: Family Medicine

## 2011-08-20 ENCOUNTER — Inpatient Hospital Stay (HOSPITAL_COMMUNITY): Payer: Medicare Other

## 2011-08-20 ENCOUNTER — Encounter (HOSPITAL_COMMUNITY): Payer: Self-pay | Admitting: *Deleted

## 2011-08-20 ENCOUNTER — Other Ambulatory Visit: Payer: Self-pay | Admitting: Gastroenterology

## 2011-08-20 DIAGNOSIS — B3781 Candidal esophagitis: Secondary | ICD-10-CM | POA: Diagnosis not present

## 2011-08-20 DIAGNOSIS — J411 Mucopurulent chronic bronchitis: Secondary | ICD-10-CM | POA: Diagnosis present

## 2011-08-20 DIAGNOSIS — R1310 Dysphagia, unspecified: Secondary | ICD-10-CM | POA: Diagnosis present

## 2011-08-20 LAB — MPO/PR-3 (ANCA) ANTIBODIES
Myeloperoxidase Abs: 5 AU/mL (ref <20)
Serine Protease 3: 2 AU/mL (ref <20)

## 2011-08-20 LAB — BASIC METABOLIC PANEL
BUN: 38 mg/dL — ABNORMAL HIGH (ref 6–23)
Chloride: 104 mEq/L (ref 96–112)
GFR calc Af Amer: 32 mL/min — ABNORMAL LOW (ref >90)
Glucose, Bld: 76 mg/dL (ref 70–99)
Potassium: 4.3 mEq/L (ref 3.5–5.1)
Sodium: 138 mEq/L (ref 135–145)

## 2011-08-20 LAB — CBC
HCT: 31.4 % — ABNORMAL LOW (ref 36.0–46.0)
Hemoglobin: 10.4 g/dL — ABNORMAL LOW (ref 12.0–15.0)
WBC: 3.3 10*3/uL — ABNORMAL LOW (ref 4.0–10.5)

## 2011-08-20 LAB — EXTRACTABLE NUCLEAR ANTIGEN ANTIBODY
SSA (Ro) (ENA) Antibody, IgG: 201 AU/mL — ABNORMAL HIGH (ref <30)
Scleroderma (Scl-70) (ENA) Antibody, IgG: 2 AU/mL (ref <30)
Sm/rnp: 1 AU/mL (ref <30)

## 2011-08-20 SURGERY — ESOPHAGOGASTRODUODENOSCOPY (EGD) WITH ESOPHAGEAL DILATION
Anesthesia: Moderate Sedation

## 2011-08-20 MED ORDER — SODIUM CHLORIDE 0.9 % IV SOLN: Freq: Once | INTRAVENOUS | Status: DC

## 2011-08-20 MED ORDER — BUTAMBEN-TETRACAINE-BENZOCAINE 2-2-14 % EX AERO
INHALATION_SPRAY | CUTANEOUS | Status: DC | PRN
  Administered 2011-08-20: 2 via TOPICAL

## 2011-08-20 MED ORDER — MIDAZOLAM HCL 10 MG/2ML IJ SOLN
INTRAMUSCULAR | Status: DC | PRN
  Administered 2011-08-20: 2 mg via INTRAVENOUS
  Administered 2011-08-20: 1 mg via INTRAVENOUS
  Administered 2011-08-20: 2 mg via INTRAVENOUS

## 2011-08-20 MED ORDER — MORPHINE SULFATE 2 MG/ML IJ SOLN
2.0000 mg | Freq: Once | INTRAMUSCULAR | Status: AC
  Administered 2011-08-20: 2 mg via INTRAVENOUS
  Filled 2011-08-20: qty 1

## 2011-08-20 MED ORDER — FENTANYL CITRATE 0.05 MG/ML IJ SOLN
INTRAMUSCULAR | Status: DC | PRN
  Administered 2011-08-20 (×2): 25 ug via INTRAVENOUS

## 2011-08-20 MED ORDER — DIPHENHYDRAMINE HCL 50 MG/ML IJ SOLN
INTRAMUSCULAR | Status: AC
  Filled 2011-08-20: qty 1

## 2011-08-20 MED ORDER — MIDAZOLAM HCL 10 MG/2ML IJ SOLN
INTRAMUSCULAR | Status: AC
  Filled 2011-08-20: qty 2

## 2011-08-20 MED ORDER — DOXYCYCLINE HYCLATE 100 MG PO TABS
100.0000 mg | ORAL_TABLET | Freq: Two times a day (BID) | ORAL | Status: DC
  Administered 2011-08-20 – 2011-08-21 (×3): 100 mg via ORAL
  Filled 2011-08-20 (×4): qty 1

## 2011-08-20 MED ORDER — FENTANYL CITRATE 0.05 MG/ML IJ SOLN
INTRAMUSCULAR | Status: AC
  Filled 2011-08-20: qty 2

## 2011-08-20 NOTE — Progress Notes (Signed)
PT Note: Received new PT order.  Pt currently in endoscopy.  Will follow-up for evaluation later today or tomorrow.  Thanks.  08/20/2011 Cephus Shelling, PT, DPT [phone]

## 2011-08-20 NOTE — Progress Notes (Signed)
I have seen and examined this patient. I have discussed with Dr Konrad Dolores.  I agree with their findings and plans as documented in their progress note for today.  Awaiting MBS to look for evidence of oropharyngeal aspiration.   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

## 2011-08-20 NOTE — Progress Notes (Signed)
Family Medicine Teaching Service [facility] Progress Note  Patient name: [name] [name] Medical record number: [id number] Date of birth: 1950-11-15 Age: 61 y.o. Gender: female    LOS: 4 days   Primary Care Provider: No primary provider on file.  Overnight Events:  Feeling well today. Still complains of bland tasting food and poor appetite. Slept well last night. No LE pain.   Objective: Vital signs in last 24 hours: Temp:  [97.2 F (36.2 C)-97.8 F (36.6 C)] 97.2 F (36.2 C) (06/03 0837) Pulse Rate:  [80-86] 80  (06/03 0549) Resp:  [16-19] 17  (06/03 0837) BP: (83-119)/(35-81) 119/81 mmHg (06/03 0837) SpO2:  [96 %-99 %] 96 % (06/03 0837) Weight:  [249 lb 8 oz (113.172 kg)] 249 lb 8 oz (113.172 kg) (06/02 2125)  Wt Readings from Last 3 Encounters:  08/19/11 249 lb 8 oz (113.172 kg)  08/19/11 249 lb 8 oz (113.172 kg)     Current Facility-Administered Medications  Medication Dose Route Frequency Provider Last Rate Last Dose  . 0.9 %  sodium chloride infusion   Intravenous Once Shirley Friar, MD      . acetaminophen (TYLENOL) tablet 650 mg  650 mg Oral Q6H PRN Ozella Rocks, MD       Or  . acetaminophen (TYLENOL) suppository 650 mg  650 mg Rectal Q6H PRN Ozella Rocks, MD      . albuterol (PROVENTIL HFA;VENTOLIN HFA) 108 (90 BASE) MCG/ACT inhaler 2 puff  2 puff Inhalation BID Leighton Roach McDiarmid, MD   2 puff at 08/19/11 1956  . albuterol (PROVENTIL HFA;VENTOLIN HFA) 108 (90 BASE) MCG/ACT inhaler 2 puff  2 puff Inhalation Q4H PRN Leighton Roach McDiarmid, MD      . aspirin EC tablet 81 mg  81 mg Oral Daily Ozella Rocks, MD   81 mg at 08/19/11 4696  . calcium carbonate (TUMS - dosed in mg elemental calcium) chewable tablet 400 mg of elemental calcium  2 tablet Oral TID PRN Garnetta Buddy, MD   400 mg of elemental calcium at 08/17/11 1546  . clonazePAM (KLONOPIN) tablet 1 mg  1 mg Oral BID Leighton Roach McDiarmid, MD   1 mg at 08/19/11 2128  . feeding supplement (NEPRO CARB STEADY)  liquid 237 mL  237 mL Oral TID BM Elyse A Shearer, RD   237 mL at 08/19/11 1405  . FLUoxetine (PROZAC) capsule 20 mg  20 mg Oral Daily Ozella Rocks, MD   20 mg at 08/19/11 [phone]  . HYDROcodone-acetaminophen (NORCO) 10-325 MG per tablet 1 tablet  1 tablet Oral BID PRN Judi Saa, DO   1 tablet at 08/18/11 1021  . ondansetron (ZOFRAN-ODT) disintegrating tablet 8 mg  8 mg Oral Q8H PRN Garnetta Buddy, MD      . pantoprazole (PROTONIX) EC tablet 40 mg  40 mg Oral Q1200 Sanjuana Letters, MD   40 mg at 08/19/11 1225  . polyethylene glycol (MIRALAX / GLYCOLAX) packet 17 g  17 g Oral Daily PRN Ozella Rocks, MD      . promethazine (PHENERGAN) tablet 25 mg  25 mg Oral Q8H PRN Judi Saa, DO   25 mg at 08/18/11 1021  . senna (SENOKOT) tablet 8.6 mg  1 tablet Oral Daily PRN Ozella Rocks, MD      . simethicone [facility] chewable tablet 80 mg  80 mg Oral Q6H PRN Tito Dine, MD   80 mg at 08/17/11 1826  . sodium  chloride 0.9 % injection 3 mL  3 mL Intravenous Q12H Ozella Rocks, MD   3 mL at 08/19/11 2128  . tiotropium (SPIRIVA) inhalation capsule 18 mcg  18 mcg Inhalation Daily Leighton Roach McDiarmid, MD   18 mcg at 08/19/11 0851     PE: Gen: NAD, obese  HEENT: MMM  CV: RRR  Res: normal effort, CTAB  Abd: non-painful to palpation  Ext/Musc: 2+ peripheral pulses Non edematous BL LE, warm and well perfused.    Labs/Studies:  BMET and CBC pending   Assessment/Plan:  [name] [name] is a 61 y.o. year old female w/ PMHx of COPD, tobacco use, and anxiety presenting with hyperkalemia, fatigue/weakness and acute renal failure.   1. ARF: etiology unknown but likely mutlifactorial including prenal causes of hypovolemia and hypotension and intrinsic causes (lisinopril prior to admission). Cr improved this am again with good PO intake. Reports normal fluid intake but poor appetite. Differential still includes Lupus, Idiopathic HUS TTP, other. Renal has signed off. I/O 480/500. Down  205cc this admission.  - Continue Strict I/O with foley  - continue to hold Lasix and Lisinopril  - BMET in am   2. Fatigue and weakness: Unsure of etiology likely multifactorial. Hgb continues to fall slowly, but seems to be stabilizing. Also consider malignancy due to wt loss, h/o tobacco use, and age. No reported GI loss of blood, but still a concern. Peripheral smear normochromic normocytic. Hemocult negative. TSH normal  - CBC today and tomorrow. - Nutrition consult   4.Pulmonology: CT Chest findings concern for auto immune, shows interstitial pattern. Pulm following.  - CXR today w/ possible bronchoscopy pending Pulm recs  5. FEN/GI: No IVF. HyperK from ARF resolved, after K-aexylate x1. GI following due to wt loss and dysphagia.  - EGD today. Will f/u results - BMET pending  - renal diet.  - Nutrition consult   6. Psych: Depression.  - Cont Prozac   7. Prophylaxis:  - SCD (no heparin at this time due to concern for TTP, and falling platelet count) - Protonix.   8. Disposition: Pending clinical improvement and      LOS 4  Signed: Shelly Flatten, MD Family Medicine Resident PGY-1 [phone] 08/20/2011 9:09 AM

## 2011-08-20 NOTE — Evaluation (Addendum)
Clinical/Bedside Swallow Evaluation Patient Details  Name: [name] [name] MRN: [id number] Date of Birth: [birth year]  Today's Date: 08/20/2011 Time: 4034-7425 SLP Time Calculation (min): 56 min  Past Medical History:  Past Medical History  Diagnosis Date  . Anxiety   . COPD (chronic obstructive pulmonary disease)   . Fluid retention   . Acute renal failure   . Shortness of breath   . Dizziness   . Angina   . Degenerative disorder of eye     Right eye   Past Surgical History:  Past Surgical History  Procedure Date  . Cholecystectomy   . Knee surgery    HPI:  61 yo female admit to [facility] 08/16/11 with weakness, fatigure, increased DOE over the last 2-3 months.  Productive cough on room air, lungs clear, WBC was 3.8 08/19/11.  Pt with chronic progressive pulmonary issues, COPD, anxiety, fluid retention.  Former smoker x30 years, quit 10 years ago, 1 1/2 PPD x 30 years previously.   Pt reports waking in the middle of the night with "foam" in her mouth - just started recently.  Pt reports frothy secretions has worsened in the hospital but occurs some at home. Dysphagia more with solids than liquids x 1 1/2 years but progressive with significant weight loss from 363 to 249 in the same time frame.  CXR stable diffuse intertstitial coarsening likely chronic.    Pt reports use of Prilosec at home for "a couple of years" religiously without improvement, Tums, and Gas X prn *always takes Tums/GasX when she goes out.  Laxative and stool softener used daily at home for bowel movement daily per pt.  Pt has not has bowel movement since hospitalized per her statement.    Pt underwent endoscopy today, with findings of candida esophagitis otherwise normal EGD.  Pt was overtly gagging and reteching during endoscopy per operative note.   MBS was ordered due to  concerns pt may be aspirating.  Pt does acknowledge reflux symptoms requiring her to expectorate frothy secretions but denies choking on them.   She  reports only being able to consume a few bites of solids prior to sensing stasis in proximal esophagus and needing liquids to aid clearance.  Pt on home oxygen at night - 1 1/2 liters and was on oxygen during the day prior to admission.   Pt sleeps on hospital bed at home as she states she can not sleep flat on her back.  Pt also uses a lift chair and elevated toilet seat.  BSE ordered for today pending MBS planned for tomorrow per Dr Marge Duncans conversation with this SLP.     Assessment / Plan / Recommendation Clinical Impression  Pt presents without cranial nerve deficits nor neurological hx.   Clinically pt reports tolerating applesauce and water better than solidst.  Cough with expectoration observed with approx 25% of trials - which pt states is her baseline function.  Pt reports primarily problems with solids pointing to proximal esophagus and reflux.  After intake of 4 ounces applesauce, 4 ounces water, one bite cherry icey-, one sip Ensure-  pt reports feeling full- and coughed up frothy secretions approx 7 minutes after intake.Marland Kitchen Recommend liquid diet  (since pt reports she swallows liquids better)  pending MBS, planned for next date.   Thanks for this referral.      Aspiration Risk  Moderate    Diet Recommendation Thin liquid   Liquid Administration via: Cup Medication Administration: Other (Comment) (as tolerated ) Compensations: Slow rate;Small sips/bites  Postural Changes and/or Swallow Maneuvers: Seated upright 90 degrees;Upright 30-60 min after meal (rest breaks as indicated)    Other  Recommendations     Follow Up Recommendations       Frequency and Duration  (to be determined after MBS)      Pertinent Vitals/Pain Afebrile, clear    SLP Swallow Goals  To be determined after MBS.    Swallow Study Prior Functional Status   61 yo female admit to [facility] 08/16/11 with weakness, fatigure, increased DOE over the last 2-3 months.  Productive cough on room air, lungs clear, WBC was  3.8 08/19/11.  Pt with chronic progressive pulmonary issues, COPD, anxiety, fluid retention.  Former smoker x30 years, quit 10 years ago, 1 1/2 PPD x 30 years previously.   Pt reports waking in the middle of the night with "foam" in her mouth - just started recently.  Pt reports frothy secretions has worsened in the hospital but occurs some at home. Dysphagia more with solids than liquids x 1 1/2 years but progressive with significant weight loss from 363 to 249 in the same time frame.  CXR stable diffuse intertstitial coarsening likely chronic.    Pt reports use of Prilosec at home for "a couple of years" religiously without improvement, Tums, and Gas X prn *always takes Tums/GasX when she goes out.  Laxative and stool softener used daily at home for bowel movement daily per pt.  Pt has not has bowel movement since hospitalized per her statement.    Pt underwent endoscopy today, with findings of candida esophagitis otherwise normal EGD.  Pt was overtly gagging and reteching during endoscopy per operative note.   MBS was ordered due to  concerns pt may be aspirating.  Pt does acknowledge reflux symptoms requiring her to expectorate frothy secretions but denies choking on them.   She reports only being able to consume a few bites of solids prior to sensing stasis in proximal esophagus and needing liquids to aid clearance.  Pt on home oxygen at night - 1 1/2 liters and was on oxygen during the day prior to admission.   Pt sleeps on hospital bed at home as she states she can not sleep flat on her back.  Pt also uses a lift chair and elevated toilet seat.  BSE ordered for today pending MBS planned for tomorrow per Dr Marge Duncans conversation with this SLP.        General   Previous Swallow Assessment: endoscopy today Diet Prior to this Study: NPO Temperature Spikes Noted: No Respiratory Status: Room air Behavior/Cognition: Alert Oral Cavity - Dentition: Dentures, top (lower present) Self-Feeding Abilities:  Able to feed self Patient Positioning: Upright in bed Baseline Vocal Quality: Clear Volitional Cough: Strong Volitional Swallow: Able to elicit    Oral/Motor/Sensory Function Overall Oral Motor/Sensory Function: Appears within functional limits for tasks assessed   Ice Chips Ice chips: Not tested   Thin Liquid Thin Liquid: Impaired Presentation: Cup;Self Fed Pharyngeal  Phase Impairments: Multiple swallows;Cough - Delayed;Cough - Immediate Other Comments: cough immediate with large bolus of water with ice, pt stated she thinks a piece of ice "slipped by her"    Nectar Thick Nectar Thick Liquid: Within functional limits Other Comments: pt verbalized that she didn't want to swallow that "slick stuff"    Honey Thick     Puree Puree: Within functional limits Presentation: Self Fed Other Comments: applesauce: 4 ounces  via cup     Solid Solid: Not tested  Donavan Burnet, MS [facility] SLP [phone]

## 2011-08-20 NOTE — Progress Notes (Signed)
Pt profile: 60yowf with multiple constitutional complaints including progressive DOE over 2-3 months found to have acute on chronic renal failure and scattered ground glass opacities on CT chest. Pulmonary Med consultation requested 5/31 to eval pulmonary infiltrates.  Active problems: Interstitial lung disease with acute and chronic components Acute renal insufficiency - Cr improving Profound fatigue and weakness Dysphagia  Subj: No major changes over WE. Still c/o cough productive of thick green mucus. No fevers. Has been on RA wit SpO2 in the mid-90s  Obj: Filed Vitals:   08/20/11 1015  BP: 130/30  Pulse:   Temp:   Resp: 22    Gen: NAD HEENT: WNL Neck: no JVD Chest: clear anteriorly Cardiac: RRR s M Abd: Obese, soft, NT, NABS Ext: No edema  BMET    Component Value Date/Time   NA 138 08/19/2011 2317   K 4.3 08/19/2011 2317   CL 106 08/19/2011 2317   CO2 24 08/19/2011 2317   GLUCOSE 89 08/19/2011 2317   BUN 43* 08/19/2011 2317   CREATININE 2.18* 08/19/2011 2317   CALCIUM 8.3* 08/19/2011 2317   GFRNONAA 23* 08/19/2011 2317   GFRAA 27* 08/19/2011 2317    CBC    Component Value Date/Time   WBC 3.8* 08/19/2011 2317   RBC 3.00* 08/19/2011 2317   HGB 9.5* 08/19/2011 2317   HCT 28.6* 08/19/2011 2317   PLT 99* 08/19/2011 2317   MCV 95.3 08/19/2011 2317   MCH 31.7 08/19/2011 2317   MCHC 33.2 08/19/2011 2317   RDW 13.6 08/19/2011 2317   LYMPHSABS 1.1 08/17/2011 0520   MONOABS 0.6 08/17/2011 0520   EOSABS 0.2 08/17/2011 0520   BASOSABS 0.0 08/17/2011 0520    CXR:  NSC to slightly improved chronic appearing interstitial prominence  IMPRESSION: 1) Chronic interstitial lung disease (fibrosis) of unclear etiology 2) Acute appearing ground glass opacities on CT chest from 08/17/11 with h/o similar (and more severe) CT chest in 2009 3) Chronic cough with sputum described as thick and green - purulent bronchitis  Clinically and radiographically improved. With the concern for an esophageal ring, one might  consider chronic aspiration as the cause of her fibrotic changes. With resolution of renal failure and an inactive sediment in UA, a pulmonary-renal syndrome seems very unlikely. In the setting of renal failure, the acute component of her lung disease might simply be volume overload and superimposed edema. At this point, any form of lung biopsy would be unlikely to yield a finding that would significantly change our mgmt strategy. She probably does need outpt follow-up with pulmonary medicine to track the course of her lung disease over time  PLAN/RECS:  1) doxycyline X 7 days for purulent bronchitis 2) I will see again 6/4 to review EGD findings    Billy Fischer, MD;  PCCM service; Mobile [phone]

## 2011-08-20 NOTE — Progress Notes (Signed)
Pt off unit

## 2011-08-20 NOTE — OR Nursing (Signed)
Procedure in Endo was delayed because pt IV site was infiltrated.  New IV was obtained, and MD was notified that pt was available for procedure to begin.  Will continue to monitor.  Angelique Blonder, RN

## 2011-08-20 NOTE — Interval H&P Note (Signed)
History and Physical Interval Note:  08/20/2011 10:25 AM  [name] [name]  has presented today for surgery, with the diagnosis of dysphagia, wt loss  The various methods of treatment have been discussed with the patient and family. After consideration of risks, benefits and other options for treatment, the patient has consented to  Procedure(s) (LRB): ESOPHAGOGASTRODUODENOSCOPY (EGD) WITH ESOPHAGEAL DILATION (N/A) as a surgical intervention .  The patients' history has been reviewed, patient examined, no change in status, stable for surgery.  I have reviewed the patients' chart and labs.  Questions were answered to the patient's satisfaction.     Stellar Gensel C.

## 2011-08-20 NOTE — Op Note (Signed)
[facility] [address] [place], [place]  [address]  ENDOSCOPY PROCEDURE REPORT  PATIENT:  [name] [name], [name] [name]  MR#:  [id number] BIRTHDATE:  [birth year], 60 yrs. old  GENDER:  female  ENDOSCOPIST:  Charlott Rakes, MD Referred by:  Doralee Albino, M.D.  PROCEDURE DATE:  08/20/2011 PROCEDURE:  EGD w/brushings ASA CLASS:  Class III INDICATIONS:  dysphagia, weight loss  MEDICATIONS:   Fentanyl 50 mcg IV, Versed 5 mg IV, Cetacaine spray x 2  TOPICAL ANESTHETIC:  DESCRIPTION OF PROCEDURE:   After the risks benefits and alternatives of the procedure were thoroughly explained, informed consent was obtained.  The Pentax Gastroscope [id number] endoscope was introduced through the mouth and advanced to the second portion of the duodenum, limited by retching and gagging.   The instrument was slowly withdrawn as the mucosa was fully examined. <<PROCEDUREIMAGES>>  FINDINGS:  The endoscope was inserted into the oropharynx and esophagus was intubated.  The gastroesophageal junction was noted to be 36 cm from the incisors. Endoscope was advanced into the stomach which revealed normal-appearing gastric mucosa. Retroflexion was done which were normal proximal stomach. The endoscope was advanced to the duodenal bulb and second portion of duodenum which were unremarkable.  The endoscope was withdrawn back into the stomach and back into the esophagus. There was minimal raised white small plaques in the midesophagus concerning for Candida esophagitis. These areas were brushed for cytology. There is no evidence of esophageal ring or esophageal stricture. The procedure was difficult due to patient's retching and gagging during the procedure.  COMPLICATIONS:  None  ENDOSCOPIC IMPRESSION:  1. Minimal Candida esophagitis. 2. Based on her intolerance of the procedure concern is she may be aspirating                 when she eats and may need a modified barium swallow to further  evaluate.  RECOMMENDATIONS:    1. Modified barium swallow +/- speech pathology consult 2. F/U on cytology for yeast  REPEAT EXAM:  N/A  ______________________________ Charlott Rakes, MD  CC:  Doralee Albino, MD  n. Rosalie DoctorCharlott Rakes at 08/20/2011 10:55 AM  [name], [phone]

## 2011-08-20 NOTE — Brief Op Note (Addendum)
See endopro note. Minimal Candida esophagitis otherwise normal EGD. Patient had retching/gagging during the procedure and I am concerned that she may be aspirating when she eats. Needs a modified barium swallow and speech pathology consult. Esophageal brushing done to confirm Candida, which is pending.

## 2011-08-21 ENCOUNTER — Inpatient Hospital Stay (HOSPITAL_COMMUNITY): Payer: Medicare Other

## 2011-08-21 LAB — BASIC METABOLIC PANEL
CO2: 22 mEq/L (ref 19–32)
Calcium: 8.9 mg/dL (ref 8.4–10.5)
Chloride: 106 mEq/L (ref 96–112)
Glucose, Bld: 89 mg/dL (ref 70–99)
Potassium: 4.5 mEq/L (ref 3.5–5.1)
Sodium: 140 mEq/L (ref 135–145)

## 2011-08-21 LAB — CBC
Hemoglobin: 10.8 g/dL — ABNORMAL LOW (ref 12.0–15.0)
MCH: 32.1 pg (ref 26.0–34.0)
Platelets: 129 10*3/uL — ABNORMAL LOW (ref 150–400)
RBC: 3.36 MIL/uL — ABNORMAL LOW (ref 3.87–5.11)
WBC: 4.1 10*3/uL (ref 4.0–10.5)

## 2011-08-21 LAB — ANCA SCREEN W REFLEX TITER
Atypical p-ANCA Screen: NEGATIVE
c-ANCA Screen: POSITIVE — AB
p-ANCA Screen: NEGATIVE

## 2011-08-21 MED ORDER — FLUCONAZOLE 200 MG PO TABS: 200.0000 mg | ORAL_TABLET | Freq: Every day | ORAL | Status: AC

## 2011-08-21 MED ORDER — ASPIRIN 81 MG PO TBEC: 81.0000 mg | DELAYED_RELEASE_TABLET | Freq: Every day | ORAL | Status: AC

## 2011-08-21 MED ORDER — FLUCONAZOLE 200 MG PO TABS
200.0000 mg | ORAL_TABLET | Freq: Every day | ORAL | Status: DC
  Administered 2011-08-21: 200 mg via ORAL
  Filled 2011-08-21: qty 1

## 2011-08-21 MED ORDER — DOXYCYCLINE HYCLATE 100 MG PO TABS: 100.0000 mg | ORAL_TABLET | Freq: Two times a day (BID) | ORAL | Status: AC

## 2011-08-21 NOTE — Progress Notes (Signed)
Pt profile: 60yowf with multiple constitutional complaints including progressive DOE over 2-3 months found to have acute on chronic renal failure and scattered ground glass opacities on CT chest. Pulmonary Med consultation requested 5/31 to eval pulmonary infiltrates.  Active problems: Interstitial lung disease with acute and chronic components Acute renal insufficiency - Cr improving Profound fatigue and weakness Dysphagia  Subj: No major changes over WE. Still c/o cough productive of thick green mucus. No fevers. Has been on RA wit SpO2 in the mid-90s  Obj: BP 97/49  Pulse 80  Temp(Src) 98.5 F (36.9 C) (Oral)  Resp 18  Ht 5\' 7"  (1.702 m)  Wt 112.4 kg (247 lb 12.8 oz)  BMI 38.81 kg/m2  SpO2 95% room air   Gen: NAD HEENT: WNL Neck: no JVD Chest: clear anteriorly, rhonchi cl w/ cough Cardiac: RRR s M Abd: Obese, soft, NT, NABS Ext: No edema  BMET    Component Value Date/Time   NA 140 08/21/2011 0555   K 4.5 08/21/2011 0555   CL 106 08/21/2011 0555   CO2 22 08/21/2011 0555   GLUCOSE 89 08/21/2011 0555   BUN 31* 08/21/2011 0555   CREATININE 1.72* 08/21/2011 0555   CALCIUM 8.9 08/21/2011 0555   GFRNONAA 31* 08/21/2011 0555   GFRAA 36* 08/21/2011 0555    CBC    Component Value Date/Time   WBC 4.1 08/21/2011 0555   RBC 3.36* 08/21/2011 0555   HGB 10.8* 08/21/2011 0555   HCT 32.3* 08/21/2011 0555   PLT 129* 08/21/2011 0555   MCV 96.1 08/21/2011 0555   MCH 32.1 08/21/2011 0555   MCHC 33.4 08/21/2011 0555   RDW 13.5 08/21/2011 0555   LYMPHSABS 1.1 08/17/2011 0520   MONOABS 0.6 08/17/2011 0520   EOSABS 0.2 08/17/2011 0520   BASOSABS 0.0 08/17/2011 0520    CXR:  NSC to slightly improved chronic appearing interstitial prominence  IMPRESSION: 1) Chronic interstitial lung disease (fibrosis) of unclear etiology-->given EGD findings and d/w GI, think this is likely due to chronic aspiration.  2) Acute appearing ground glass opacities on CT chest from 08/17/11 with h/o similar (and more severe) CT chest  in 2009 3) Chronic cough with sputum described as thick and green - purulent bronchitis  PLAN/RECS:  1) doxycyline X 7 days for purulent bronchitis 2) Appreciate SLP eval and recs. Hope this will help/  3) we will s/o, ok for d/c from our standpoint.      Billy Fischer, MD;  PCCM service; Mobile [phone]

## 2011-08-21 NOTE — Procedures (Signed)
Objective Swallowing Evaluation: Modified Barium Swallowing Study  Patient Details  Name: [name] [name] MRN: [id number] Date of Birth: 24-Mar-1950  Today's Date: 08/21/2011 Time: 0920-0940 SLP Time Calculation (min): 20 min  Past Medical History:  Past Medical History  Diagnosis Date  . Anxiety   . COPD (chronic obstructive pulmonary disease)   . Fluid retention   . Acute renal failure   . Shortness of breath   . Dizziness   . Angina   . Degenerative disorder of eye     Right eye   Past Surgical History:  Past Surgical History  Procedure Date  . Cholecystectomy   . Knee surgery    HPI:  61 yr old admitted with hyperkalemia, fatigue/weakness and acute renal failure.  During bedside swallow assessment pt. exhibited possible pharyngeal dysphagia versus esophageal impairments and MBS recommended to fully assess.  Pt. had endoscopy yesterday which revealed candida esophagitis but no other abnormality.  Please see bedside report for additional info.         Assessment / Plan / Recommendation Clinical Impression  Dysphagia Diagnosis: Mild pharyngeal phase dysphagia Clinical impression: Pt. exhibited mild sensory pharyngeal dysphagia with delayed swallow initiation to valleculae and laryngeal penetration with thin (intermittent flash and penetration remaining in vestibule).  A chin tuck posture consistently prevented penetration.  Esophagus was scanned briefly revealing what appeared to be intermittent residue in distal esophagus (observed once).  Pt. masticated graham cracker and stated "that didn't scratch and went down pretty good."  Suspect pt.'s esophageal symptoms are most likely resulting from either pain/discomfort due to esophagitis.  SLP recommends pt. upgrade to Dys 2 texture and continue thin liquids (pt. in agreement) and follow reflux precautions.        Treatment Recommendation  Therapy as outlined in treatment plan below    Diet Recommendation Dysphagia 2 (Fine chop);Thin  liquid   Liquid Administration via: Cup;No straw Medication Administration: Whole meds with puree Supervision: Full supervision/cueing for compensatory strategies Compensations: Slow rate;Small sips/bites Postural Changes and/or Swallow Maneuvers: Seated upright 90 degrees;Upright 30-60 min after meal;Chin tuck (chin tuck with liquids)    Other  Recommendations Oral Care Recommendations: Oral care BID   Follow Up Recommendations   (TBD)    Frequency and Duration min 2x/week  2 weeks       SLP Swallow Goals Patient will consume recommended diet without observed clinical signs of aspiration with: Minimal cueing Patient will utilize recommended strategies during swallow to increase swallowing safety with: Minimal cueing   General HPI: 61 yr old admitted with hyperkalemia, fatigue/weakness and acute renal failure.  During bedside swallow assessment pt. exhibited possible pharyngeal dysphagia versus esophageal impairments and MBS recommended to fully assess.  Pt. had endoscopy yesterday which revealed candida esophagitis but no other abnormality.  Please see bedside report for additional info.     Type of Study: Modified Barium Swallowing Study Reason for Referral: Objectively evaluate swallowing function Diet Prior to this Study: Thin liquids (clear liquids only) Respiratory Status: Room air Behavior/Cognition: Alert Oral Cavity - Dentition: Dentures, top Oral Motor / Sensory Function: Impaired - see Bedside swallow eval Patient Positioning: Upright in chair Baseline Vocal Quality: Clear Anatomy: Within functional limits Pharyngeal Secretions: Not observed secondary MBS    Reason for Referral Objectively evaluate swallowing function   Oral Phase     Pharyngeal Phase Pharyngeal Phase: Impaired   Cervical Esophageal Phase Cervical Esophageal Phase: Leonarda Salon    Darrow Bussing.Ed [employer] [phone]  08/21/2011

## 2011-08-21 NOTE — Progress Notes (Signed)
   CARE MANAGEMENT NOTE 08/21/2011  Patient:  [name] [name],[name] [name]   Account Number:  [id number]  Date Initiated:  08/17/2011  Documentation initiated by:  Darlyne Russian  Subjective/Objective Assessment:   Patient admitted with lower leg pain and no urine output.     Action/Plan:   Progression of care and discharge planning   Anticipated DC Date:  08/20/2011   Anticipated DC Plan:  HOME W HOME HEALTH SERVICES      DC Planning Services  CM consult      Choice offered to / List presented to:             Status of service:  In process, will continue to follow Medicare Important Message given?   (If response is "NO", the following Medicare IM given date fields will be blank) Date Medicare IM given:   Date Additional Medicare IM given:    Discharge Disposition:    Per UR Regulation:    If discussed at Long Length of Stay Meetings, dates discussed:    Comments:  08/21/2011 [address] RN, [place] [phone] Met with patient regarding discharge to home, no home health referrals, patient to contact MD for follow up appointment.    08/17/2011  [address] RN, [place] [phone] PCP: Dr Lanora Manis [facility]/ [place]  Met with patient to discuss CM and discharge planning. She lives at home with her spouse and son, one level trailer. She has O2 1.5 L via [place] to use at night and during the day when SOB. No previous or current home health services. CM to continue to follow for discharge planning needs.

## 2011-08-21 NOTE — Progress Notes (Signed)
Speech Language Pathology Treatment Patient Details Name: Genita Nilsson MRN: [id number] DOB: [birth year] Today's Date: 08/21/2011 Time:  -   MBS completed.  Full report will be documented. Recommend:  Diet upgrade to Dys 2, thin liquids, chin tuck with liquids, pills whole in applesauce.     Breck Coons [place].Ed [employer] [phone]  08/21/2011

## 2011-08-21 NOTE — Progress Notes (Signed)
Physical Therapy Cancellation Note: pt currently off floor for procedure/test and unable to be seen. Will attempt as time allows. Thanks. Delaney Meigs, PT [phone]

## 2011-08-21 NOTE — Discharge Instructions (Signed)
You were admitted due to your kidneys struggling produce urine. Please follow up with your kidney doctor as adivsed. Please follow up with your primary car physician within the next few days. Please stop taking your lisinopril, and discuss restarting your lasix with your primary care doctor. Please take your doxycycline and fluconazole for a total of 7 days.

## 2011-08-22 LAB — ANCA TITERS: C-ANCA: 1:80 {titer} — ABNORMAL HIGH

## 2011-08-22 NOTE — Discharge Summary (Signed)
I discussed with Dr Konrad Dolores.  I agree with their plans documented in their discharge note.

## 2011-08-23 DIAGNOSIS — R112 Nausea with vomiting, unspecified: Secondary | ICD-10-CM | POA: Diagnosis not present

## 2011-08-23 DIAGNOSIS — M545 Low back pain: Secondary | ICD-10-CM | POA: Diagnosis not present

## 2011-08-23 DIAGNOSIS — G4700 Insomnia, unspecified: Secondary | ICD-10-CM | POA: Diagnosis not present

## 2011-09-27 DIAGNOSIS — I10 Essential (primary) hypertension: Secondary | ICD-10-CM | POA: Diagnosis not present

## 2011-09-27 DIAGNOSIS — I509 Heart failure, unspecified: Secondary | ICD-10-CM | POA: Diagnosis not present

## 2011-09-27 DIAGNOSIS — R079 Chest pain, unspecified: Secondary | ICD-10-CM | POA: Diagnosis not present

## 2011-09-27 DIAGNOSIS — K219 Gastro-esophageal reflux disease without esophagitis: Secondary | ICD-10-CM | POA: Diagnosis not present

## 2011-10-02 DIAGNOSIS — G4700 Insomnia, unspecified: Secondary | ICD-10-CM | POA: Diagnosis not present

## 2011-10-02 DIAGNOSIS — N189 Chronic kidney disease, unspecified: Secondary | ICD-10-CM | POA: Diagnosis not present

## 2011-10-02 DIAGNOSIS — I509 Heart failure, unspecified: Secondary | ICD-10-CM | POA: Diagnosis not present

## 2011-10-02 DIAGNOSIS — R079 Chest pain, unspecified: Secondary | ICD-10-CM | POA: Diagnosis not present

## 2011-10-02 DIAGNOSIS — R112 Nausea with vomiting, unspecified: Secondary | ICD-10-CM | POA: Diagnosis not present

## 2011-10-29 DIAGNOSIS — R112 Nausea with vomiting, unspecified: Secondary | ICD-10-CM | POA: Diagnosis not present

## 2011-10-29 DIAGNOSIS — R5381 Other malaise: Secondary | ICD-10-CM | POA: Diagnosis not present

## 2011-10-29 DIAGNOSIS — R5383 Other fatigue: Secondary | ICD-10-CM | POA: Diagnosis not present

## 2011-10-29 DIAGNOSIS — G4700 Insomnia, unspecified: Secondary | ICD-10-CM | POA: Diagnosis not present

## 2011-10-29 DIAGNOSIS — L0201 Cutaneous abscess of face: Secondary | ICD-10-CM | POA: Diagnosis not present

## 2011-10-29 DIAGNOSIS — L03211 Cellulitis of face: Secondary | ICD-10-CM | POA: Diagnosis not present

## 2011-10-29 DIAGNOSIS — M545 Low back pain: Secondary | ICD-10-CM | POA: Diagnosis not present

## 2011-11-05 ENCOUNTER — Encounter (HOSPITAL_COMMUNITY): Payer: Self-pay | Admitting: *Deleted

## 2011-11-05 ENCOUNTER — Inpatient Hospital Stay (HOSPITAL_COMMUNITY)
Admission: EM | Admit: 2011-11-05 | Discharge: 2011-11-07 | DRG: 683 | Disposition: A | Discharge status: Home / Self Care | Payer: Medicare Other | Attending: Internal Medicine | Admitting: Internal Medicine

## 2011-11-05 ENCOUNTER — Emergency Department (HOSPITAL_COMMUNITY): Payer: Medicare Other

## 2011-11-05 DIAGNOSIS — F329 Major depressive disorder, single episode, unspecified: Secondary | ICD-10-CM | POA: Diagnosis present

## 2011-11-05 DIAGNOSIS — I959 Hypotension, unspecified: Secondary | ICD-10-CM | POA: Diagnosis not present

## 2011-11-05 DIAGNOSIS — R627 Adult failure to thrive: Secondary | ICD-10-CM | POA: Diagnosis present

## 2011-11-05 DIAGNOSIS — D649 Anemia, unspecified: Secondary | ICD-10-CM

## 2011-11-05 DIAGNOSIS — N183 Chronic kidney disease, stage 3 unspecified: Secondary | ICD-10-CM | POA: Diagnosis present

## 2011-11-05 DIAGNOSIS — J8410 Pulmonary fibrosis, unspecified: Secondary | ICD-10-CM | POA: Diagnosis present

## 2011-11-05 DIAGNOSIS — E860 Dehydration: Secondary | ICD-10-CM

## 2011-11-05 DIAGNOSIS — J449 Chronic obstructive pulmonary disease, unspecified: Secondary | ICD-10-CM | POA: Diagnosis present

## 2011-11-05 DIAGNOSIS — F3289 Other specified depressive episodes: Secondary | ICD-10-CM | POA: Diagnosis present

## 2011-11-05 DIAGNOSIS — J42 Unspecified chronic bronchitis: Secondary | ICD-10-CM | POA: Diagnosis not present

## 2011-11-05 DIAGNOSIS — D61818 Other pancytopenia: Secondary | ICD-10-CM | POA: Diagnosis not present

## 2011-11-05 DIAGNOSIS — N189 Chronic kidney disease, unspecified: Secondary | ICD-10-CM | POA: Diagnosis not present

## 2011-11-05 DIAGNOSIS — Z9181 History of falling: Secondary | ICD-10-CM | POA: Diagnosis not present

## 2011-11-05 DIAGNOSIS — K089 Disorder of teeth and supporting structures, unspecified: Secondary | ICD-10-CM | POA: Diagnosis not present

## 2011-11-05 DIAGNOSIS — Z7982 Long term (current) use of aspirin: Secondary | ICD-10-CM

## 2011-11-05 DIAGNOSIS — R42 Dizziness and giddiness: Secondary | ICD-10-CM | POA: Diagnosis not present

## 2011-11-05 DIAGNOSIS — F17200 Nicotine dependence, unspecified, uncomplicated: Secondary | ICD-10-CM | POA: Diagnosis present

## 2011-11-05 DIAGNOSIS — R5381 Other malaise: Secondary | ICD-10-CM | POA: Diagnosis present

## 2011-11-05 DIAGNOSIS — F411 Generalized anxiety disorder: Secondary | ICD-10-CM | POA: Diagnosis present

## 2011-11-05 DIAGNOSIS — N179 Acute kidney failure, unspecified: Secondary | ICD-10-CM | POA: Diagnosis not present

## 2011-11-05 DIAGNOSIS — R531 Weakness: Secondary | ICD-10-CM

## 2011-11-05 DIAGNOSIS — Z8249 Family history of ischemic heart disease and other diseases of the circulatory system: Secondary | ICD-10-CM | POA: Diagnosis not present

## 2011-11-05 DIAGNOSIS — J4489 Other specified chronic obstructive pulmonary disease: Secondary | ICD-10-CM | POA: Diagnosis present

## 2011-11-05 DIAGNOSIS — I129 Hypertensive chronic kidney disease with stage 1 through stage 4 chronic kidney disease, or unspecified chronic kidney disease: Secondary | ICD-10-CM | POA: Diagnosis present

## 2011-11-05 DIAGNOSIS — R5383 Other fatigue: Secondary | ICD-10-CM | POA: Diagnosis not present

## 2011-11-05 DIAGNOSIS — Z79899 Other long term (current) drug therapy: Secondary | ICD-10-CM | POA: Diagnosis not present

## 2011-11-05 DIAGNOSIS — R55 Syncope and collapse: Secondary | ICD-10-CM | POA: Diagnosis not present

## 2011-11-05 HISTORY — DX: Heart failure, unspecified: I50.9

## 2011-11-05 HISTORY — DX: Essential (primary) hypertension: I10

## 2011-11-05 HISTORY — DX: Unspecified osteoarthritis, unspecified site: M19.90

## 2011-11-05 LAB — CBC WITH DIFFERENTIAL/PLATELET
Eosinophils Relative: 3 % (ref 0–5)
Hemoglobin: 10.5 g/dL — ABNORMAL LOW (ref 12.0–15.0)
Lymphocytes Relative: 19 % (ref 12–46)
Lymphs Abs: 1 10*3/uL (ref 0.7–4.0)
MCV: 96.9 fL (ref 78.0–100.0)
Monocytes Relative: 9 % (ref 3–12)
Neutrophils Relative %: 68 % (ref 43–77)
Platelets: 107 10*3/uL — ABNORMAL LOW (ref 150–400)
RBC: 3.27 MIL/uL — ABNORMAL LOW (ref 3.87–5.11)
WBC: 4.9 10*3/uL (ref 4.0–10.5)

## 2011-11-05 LAB — CARDIAC PANEL(CRET KIN+CKTOT+MB+TROPI)
Relative Index: INVALID (ref 0.0–2.5)
Troponin I: <0.3 ng/mL (ref <0.30)

## 2011-11-05 LAB — URINALYSIS, ROUTINE W REFLEX MICROSCOPIC
Glucose, UA: NEGATIVE mg/dL
Hgb urine dipstick: NEGATIVE
pH: 5 (ref 5.0–8.0)

## 2011-11-05 LAB — RETICULOCYTES: RBC.: 2.81 MIL/uL — ABNORMAL LOW (ref 3.87–5.11)

## 2011-11-05 LAB — POCT I-STAT TROPONIN I: Troponin i, poc: 0 ng/mL (ref 0.00–0.08)

## 2011-11-05 LAB — URINE MICROSCOPIC-ADD ON

## 2011-11-05 LAB — BASIC METABOLIC PANEL
CO2: 22 mEq/L (ref 19–32)
Calcium: 8.7 mg/dL (ref 8.4–10.5)
Potassium: 4.1 mEq/L (ref 3.5–5.1)
Sodium: 137 mEq/L (ref 135–145)

## 2011-11-05 MED ORDER — ALBUTEROL SULFATE HFA 108 (90 BASE) MCG/ACT IN AERS
2.0000 | INHALATION_SPRAY | RESPIRATORY_TRACT | Status: DC | PRN
  Filled 2011-11-05: qty 6.7

## 2011-11-05 MED ORDER — SODIUM CHLORIDE 0.9 % IV SOLN: 1000.0000 mL | INTRAVENOUS | Status: DC

## 2011-11-05 MED ORDER — SODIUM CHLORIDE 0.9 % IV SOLN
1000.0000 mL | Freq: Once | INTRAVENOUS | Status: AC
  Administered 2011-11-05: 1000 mL via INTRAVENOUS

## 2011-11-05 MED ORDER — FLUTICASONE-SALMETEROL 250-50 MCG/DOSE IN AEPB
1.0000 | INHALATION_SPRAY | Freq: Two times a day (BID) | RESPIRATORY_TRACT | Status: DC
Start: 2011-11-05 — End: 2011-11-07
  Administered 2011-11-05 – 2011-11-07 (×4): 1 via RESPIRATORY_TRACT
  Filled 2011-11-05: qty 14

## 2011-11-05 MED ORDER — PROMETHAZINE HCL 25 MG PO TABS
12.5000 mg | ORAL_TABLET | Freq: Four times a day (QID) | ORAL | Status: DC | PRN
  Administered 2011-11-07: 12.5 mg via ORAL
  Filled 2011-11-05: qty 1

## 2011-11-05 MED ORDER — SODIUM CHLORIDE 0.9 % IV SOLN
1000.0000 mL | INTRAVENOUS | Status: AC
  Administered 2011-11-05: 1000 mL via INTRAVENOUS

## 2011-11-05 MED ORDER — DOCUSATE SODIUM 100 MG PO CAPS
100.0000 mg | ORAL_CAPSULE | Freq: Two times a day (BID) | ORAL | Status: DC
  Administered 2011-11-05 – 2011-11-07 (×4): 100 mg via ORAL
  Filled 2011-11-05 (×3): qty 1

## 2011-11-05 MED ORDER — CITALOPRAM HYDROBROMIDE 20 MG PO TABS
20.0000 mg | ORAL_TABLET | Freq: Every day | ORAL | Status: DC
  Administered 2011-11-06: 20 mg via ORAL
  Filled 2011-11-05: qty 1

## 2011-11-05 MED ORDER — ACLIDINIUM BROMIDE 400 MCG/ACT IN AEPB: 1.0000 | INHALATION_SPRAY | Freq: Two times a day (BID) | RESPIRATORY_TRACT | Status: DC | PRN

## 2011-11-05 MED ORDER — SODIUM CHLORIDE 0.9 % IV SOLN
1000.0000 mL | INTRAVENOUS | Status: DC
  Administered 2011-11-05 (×2): 1000 mL via INTRAVENOUS

## 2011-11-05 NOTE — ED Provider Notes (Signed)
History     CSN: [id number]  Arrival date & time 11/05/11  1044   First MD Initiated Contact with Patient 11/05/11 [phone]      Chief Complaint  Patient presents with  . Near Syncope  . Irregular Heart Beat    (Consider location/radiation/quality/duration/timing/severity/associated sxs/prior treatment) HPI:  61 year old woman with COPD and CHF, presenting with weeks of dizziness.  She was hospitalized here in early June for ARF.  There was concern then about intrarenal causes.  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  Her discharge creatinine was 1.7.  Since then, according to the patient, she has been having dizziness upon standing.  She has been bed ridden since the hospitalization, and everytime she rises to standing, she feels her heart racing, she becomes lightheaded, and she feels like she will pass out.  She has had no syncopal episodes.  She had some mild chest discomfort last night when one of the pre-syncopal episodes occurred, but is not complaining of chest discomfort today.  She denies headaches, worsening dyspnea, abdominal pain, nausea, and vomiting.  She does have some loose stools, probably from the stool softeners she regularly takes, but no diarrhea.  She reports a history of severe weight loss from 360 pounds to 225 over the last 2 years. She also reports that she is been told that she has bulimia. She can not eat food after smelling it - she would vomit it.  Patient uses home oxygen 1.5 L at night. She is complaining of coughing with green sputum. However, she denies fevers or chills.  Past Medical History  Diagnosis Date  . Anxiety   . COPD (chronic obstructive pulmonary disease)   . Fluid retention   . Acute renal failure   . Shortness of breath   . Dizziness   . Angina   . Degenerative disorder of eye     Right eye  . CHF (congestive heart failure)   . Arthritis   . Hypertension     Past Surgical History  Procedure Date  .  Cholecystectomy   . Knee surgery     Family History  Problem Relation Age of Onset  . Heart failure Mother   . Stroke Father     History  Substance Use Topics  . Smoking status: Former Smoker -- 30 years    Quit date: 03/19/2001  . Smokeless tobacco: Current User    Types: Snuff  . Alcohol Use: No    OB History    Grav Para Term Preterm Abortions TAB SAB Ect Mult Living                  Review of Systems  Constitutional: Positive for appetite change and fatigue. Negative for fever, chills and diaphoresis.  HENT: Negative for sore throat, sneezing and postnasal drip.   Eyes: Negative.   Respiratory: Positive for cough, shortness of breath and wheezing. Negative for apnea, choking, chest tightness and stridor.   Cardiovascular: Positive for palpitations. Negative for chest pain and leg swelling.  Gastrointestinal: Positive for constipation. Negative for nausea, vomiting, abdominal pain, diarrhea, blood in stool, abdominal distention, anal bleeding and rectal pain.  Genitourinary: Negative.   Musculoskeletal: Negative.   Skin: Negative.   Neurological: Positive for light-headedness. Negative for dizziness, tremors, seizures, facial asymmetry, speech difficulty, weakness, numbness and headaches.  Psychiatric/Behavioral: Negative.   All other systems reviewed and are negative.    Allergies  Review of patient's allergies indicates no known allergies.  Home Medications   Current Outpatient Rx  Name Route Sig Dispense Refill  . ACLIDINIUM BROMIDE 400 MCG/ACT IN AEPB Inhalation Inhale 1 puff into the lungs 2 (two) times daily as needed. For shortness of breath    . ALBUTEROL SULFATE HFA 108 (90 BASE) MCG/ACT IN AERS Inhalation Inhale 2 puffs into the lungs every 6 (six) hours as needed. For shortness of breath    . ASPIRIN 81 MG PO TBEC Oral Take 1 tablet (81 mg total) by mouth daily. 30 tablet 6  . CITALOPRAM HYDROBROMIDE 20 MG PO TABS Oral Take 20 mg by mouth daily.    Marland Kitchen  FLUOXETINE HCL 20 MG PO CAPS Oral Take 20 mg by mouth daily.    . FUROSEMIDE 40 MG PO TABS Oral Take 40 mg by mouth daily.    Marland Kitchen HYDROCODONE-ACETAMINOPHEN 10-325 MG PO TABS Oral Take 1 tablet by mouth every 6 (six) hours as needed. For pain    . PROMETHAZINE HCL 25 MG PO TABS Oral Take 25 mg by mouth every 6 (six) hours as needed. For nausea    . TRAMADOL HCL 50 MG PO TABS Oral Take 50 mg by mouth every 6 (six) hours as needed. For pain      BP 93/64  Pulse 70  Temp 97.8 F (36.6 C) (Oral)  Resp 20  SpO2 100%  Physical Exam  Constitutional: She is oriented to person, place, and time. She appears well-developed and well-nourished. She does not appear ill. No distress.  HENT:  Mouth/Throat: Uvula is midline. Mucous membranes are dry. No oropharyngeal exudate, posterior oropharyngeal edema or posterior oropharyngeal erythema.  Eyes: Conjunctivae are normal. Pupils are equal, round, and reactive to light.  Neck: Neck supple.  Cardiovascular: Normal rate, regular rhythm and normal heart sounds.   No murmur heard. Pulmonary/Chest: Effort normal and breath sounds normal.  Abdominal: Soft. There is no tenderness.  Musculoskeletal:       Right lower leg: She exhibits edema (trace).       Left lower leg: She exhibits edema (trace).  Lymphadenopathy:    She has no cervical adenopathy.  Neurological: She is alert and oriented to person, place, and time.  Skin: Skin is warm, dry and intact.    ED Course  Procedures (including critical care time)  Labs Reviewed  BASIC METABOLIC PANEL - Abnormal; Notable for the following:    BUN 48 (*)     Creatinine, Ser 3.00 (*)     GFR calc non Af Amer 16 (*)     GFR calc Af Amer 18 (*)     All other components within normal limits  CBC WITH DIFFERENTIAL - Abnormal; Notable for the following:    RBC 3.27 (*)     Hemoglobin 10.5 (*)     HCT 31.7 (*)     Platelets 107 (*)  PLATELET COUNT CONFIRMED BY SMEAR   All other components within normal limits   POCT I-STAT TROPONIN I  URINALYSIS, ROUTINE W REFLEX MICROSCOPIC   Dg Chest 2 View  11/05/2011  *RADIOLOGY REPORT*  Clinical Data: Near syncope.  Irregular heart beat.  Weakness, shortness of breath.  CHEST - 2 VIEW  Comparison: 08/20/2011  Findings: Chronic interstitial disease and peribronchial thickening.  Heart is normal size.  No acute opacities or effusions.  No acute bony abnormality.  IMPRESSION: Chronic interstitial lung disease and chronic bronchitis.  No real change since prior study.   Original Report Authenticated By: Cyndie Chime, M.D. ( 11/05/2011  12:44:05 )       Date: 11/05/2011  Rate: 81 and regular   Rhythm: normal sinus rhythm  QRS Axis: normal  Intervals: Normal   ST/T Wave abnormalities: normal  Conduction Disutrbances:none  Narrative Interpretation: No features acute myocardial infarction  Old EKG Reviewed: unchanged     MDM  1.   Acute on chronic renal failure:  History and physical support a hypovolemic fluid status, resulting in prerenal AKI.  Patient's history of possible immune mediated intrarenal injury raises the suspicion of disease progression, now that her creatinine is 3.0 (up from 1.7). Noted a normal Chest x-ray without any acute infiltrates. Started 1 L of IV fluids. We will withhold for furosemide do to a possibility of dehydration.   2.  Near-syncope:  Details of history very suggestive of orthostatic hypotension.  Autonomic dysfunction and decompensation from being bed-ridden for so long could be the sole cause, but the rise in creatinine and BUN suggest a component of hypovolemia as well. Symptoms do not suggest cardiac arrhythmia.  Another possible cause of her symptoms could be medication side effect mainly Fluoxetine.   3. Failure to thrive This patient is reporting chronic fatigue compounded with a significant weight loss over the last couple of months and inability to get out of bed. She has a history of decubitus ulcers the past but have  now healed with regular turning. The cause of this failure to thrive is multifactorial including COPD, and kidney failure, and debilitating dizziness.  Dow Adolph, MD 11/05/11 1645  Dow Adolph, MD 11/05/11 1714

## 2011-11-05 NOTE — H&P (Signed)
Hospital Admission Note Date: 11/05/2011  Patient name: [name] [name] Medical record number: [id number] Date of birth: October 25, 1950 Age: 61 y.o. Gender: female PCP: No primary provider on file.  Medical Service:  Attending physician:     1st Contact:  Denton Ar, MD  Pager: [phone] 2nd Contact:  Elyse Jarvis, MD  Pager: [phone] After 5 pm or weekends: 1st Contact:      Pager: [phone] 2nd Contact:      Pager: [phone]  Chief Complaint:  dizziness   History of Present Illness: Patient is a 61 year old lady with a history of COPD, CHF, hypertension, recent hospitalization in June for ARF, who presents with a chief complaint of dizziness.  She is noted to be a poor historian, ?cognitive impairment. She states that over the past 2 months she has experienced decreased appetite, fatigue, dizziness, weakness, and increased number of falls at home. She states that she stays mostly in bed and ambulates only several steps to get to the bathroom. When she does get up and walk, she had significant dizziness, and she feels her body "goes limp". She reports recent weight loss, decreased appetite, and nausea at the smell of food. The patient reports using oxygen at night at 1.5 L. She was given oxygen 3 months ago when she was told "her heart stopped" while she was sleeping. At the time of examination, she reports having some chest pressure. She states she has this on a daily basis, but it resolved completely after taking an aspirin underneath her tongue. It is unclear whether she chews aspirin or whether she is referring to nitroglycerin. She states she has not taken her aspirin yet today. Of note, patient thinks she is taking both Prozac and citalopram, one in the morning one at night. She says her PA put her on these medications 2 weeks ago. Review of systems positive for coughing with thick green sputum production, constipation, dyspnea on exertion, shortness of breath, and she sleeps sitting up. She denies  fever, chills, vomiting, joint pain, arthralgias. Family history is significant for lethal MI in her father at age 21, MI in her mother at age 65, and also multiple siblings with MIs and heart failure.  Meds: Current Outpatient Rx  Name Route Sig Dispense Refill  . ACLIDINIUM BROMIDE 400 MCG/ACT IN AEPB Inhalation Inhale 1 puff into the lungs 2 (two) times daily as needed. For shortness of breath    . ALBUTEROL SULFATE HFA 108 (90 BASE) MCG/ACT IN AERS Inhalation Inhale 2 puffs into the lungs every 6 (six) hours as needed. For shortness of breath    . ASPIRIN 81 MG PO TBEC Oral Take 1 tablet (81 mg total) by mouth daily. 30 tablet 6  . CITALOPRAM HYDROBROMIDE 20 MG PO TABS Oral Take 20 mg by mouth daily.    Marland Kitchen FLUOXETINE HCL 20 MG PO CAPS Oral Take 20 mg by mouth daily.    . FUROSEMIDE 40 MG PO TABS Oral Take 40 mg by mouth daily.    Marland Kitchen HYDROCODONE-ACETAMINOPHEN 10-325 MG PO TABS Oral Take 1 tablet by mouth every 6 (six) hours as needed. For pain    . PROMETHAZINE HCL 25 MG PO TABS Oral Take 25 mg by mouth every 6 (six) hours as needed. For nausea    . TRAMADOL HCL 50 MG PO TABS Oral Take 50 mg by mouth every 6 (six) hours as needed. For pain      Allergies: Allergies as of 11/05/2011  . (No Known Allergies)   Past  Medical History  Diagnosis Date  . Anxiety   . COPD (chronic obstructive pulmonary disease)   . Fluid retention   . Acute renal failure   . Shortness of breath   . Dizziness   . Angina   . Degenerative disorder of eye     Right eye  . CHF (congestive heart failure)   . Arthritis   . Hypertension    Past Surgical History  Procedure Date  . Cholecystectomy   . Knee surgery    Family History  Problem Relation Age of Onset  . Heart failure Mother   . Stroke Father    History   Social History  . Marital Status: Married    Spouse Name: N/A    Number of Children: N/A  . Years of Education: N/A   Occupational History  . Not on file.   Social History Main  Topics  . Smoking status: Former Smoker -- 30 years    Quit date: 03/19/2001  . Smokeless tobacco: Current User    Types: Snuff  . Alcohol Use: No  . Drug Use: No  . Sexually Active:    Other Topics Concern  . Not on file   Social History Narrative  . No narrative on file    Review of Systems: Pertinent items are noted in HPI. Neg for joint pain  Physical Exam: Blood pressure 93/64, pulse 70, temperature 97.8 F (36.6 C), temperature source Oral, resp. rate 20, SpO2 100.00%. Physical Exam Blood pressure 98/49, pulse 60, temperature 98.3 F (36.8 C), temperature source Oral, resp. rate 20, SpO2 100.00%. General:  No acute distress, alert and oriented x 3, slow to respond to questions  HEENT:  P pinpoint, ERRL, EOMI, no lymphadenopathy, dry mucous membranes Cardiovascular:  Regular rate and rhythm, no murmurs, rubs or gallops Respiratory:  Clear to auscultation bilaterally, no wheezes, rales, or rhonchi Abdomen:  Soft, nondistended, nontender, normoactive bowel sounds Extremities:  Dark pigmented lesions over anterior LE. Trace edema. Warm and well-perfused, no clubbing, cyanosis.  Skin: Warm, dry Neuro: Not anxious appearing, no depressed mood, normal affect CN intact, grip strength decreased, other UE strength 5/5. Sensation in tact.    Lab results: Basic Metabolic Panel:  Basename 11/05/11 1223  NA 137  K 4.1  CL 104  CO2 22  GLUCOSE 85  BUN 48*  CREATININE 3.00*  CALCIUM 8.7  MG --  PHOS --   Liver Function Tests: No results found for this basename: AST:2,ALT:2,ALKPHOS:2,BILITOT:2,PROT:2,ALBUMIN:2 in the last 72 hours No results found for this basename: LIPASE:2,AMYLASE:2 in the last 72 hours No results found for this basename: AMMONIA:2 in the last 72 hours CBC:  Basename 11/05/11 1223  WBC 4.9  NEUTROABS 3.4  HGB 10.5*  HCT 31.7*  MCV 96.9  PLT 107*    Alcohol Level: No results found for this basename: ETH:2 in the last 72  hours Urinalysis:  Basename 11/05/11 1633  COLORURINE YELLOW  LABSPEC 1.013  PHURINE 5.0  GLUCOSEU NEGATIVE  HGBUR NEGATIVE  BILIRUBINUR NEGATIVE  KETONESUR NEGATIVE  PROTEINUR NEGATIVE  UROBILINOGEN 0.2  NITRITE NEGATIVE  LEUKOCYTESUR MODERATE*    Imaging results:  Dg Chest 2 View  11/05/2011  *RADIOLOGY REPORT*  Clinical Data: Near syncope.  Irregular heart beat.  Weakness, shortness of breath.  CHEST - 2 VIEW  Comparison: 08/20/2011  Findings: Chronic interstitial disease and peribronchial thickening.  Heart is normal size.  No acute opacities or effusions.  No acute bony abnormality.  IMPRESSION: Chronic interstitial lung disease  and chronic bronchitis.  No real change since prior study.   Original Report Authenticated By: Cyndie Chime, M.D. ( 11/05/2011 12:44:05 )     Other results: EKG: diffused T wave flattening. No acute ST elevation or depression  Assessment & Plan by Problem:  1. Fatigue/weakness: unclear etiology at this time.  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 Dehydration with poor oral intake vs. Deconditioning vs. Anemia vs. orthostatics.  Patient has been hospitalized for similar complaints in 07/2011 with full autoimmune work-up along with nephrology and pulmonology consults.  Per pulmonology's note in the past, she has lost over 90 pounds intentionally over the past 18 months. -Admit to med-surg -Hydration with IVF, patient received 2L of NS in ED, will continue hydration at 125cc/hr x 12 hours  -Obtain orthostatic vitals -PT/OT consult -Work up for her anemia since her baseline Hb is 9-10.  Will also need to send for FOBT and further work up for GI malignancy. -Will check TSH -Will hold Tramadol some of the side effects include: hypotension, anorexia  2. Chest pain: appears chronic, she has been having angina daily which is relieved with what sounded like Nitroglycerin however,  patient reports "aspirin" makes it better.  EKG today did not show any acute ST elevation or depression except for diffused T wave flattening.    -Will check EKG in AM -Cycle cardiac enzymes  -Hold ASA in setting of acute on chronic CKD -If positive enzymes or changes on EKG, will start heparin gtt -May give PRN nitro SL for chest pain (holding parameters for BP)  3. Acute on chronic kidney disease: Cr is 3 today which trended up from 1.7 from last discharge in May 2013.  BUN is elevated at 48 which suggests volume contraction.  She also had a full work up last admission including +c-ANCA , ESR, and BNP.  Renal was also consulted during last admission and it was thought that her renal failure was 2/2 ACEi and volume depletion.   -Will treat with IVF and monitor BMP -Hold nephrotoxic meds at this time including ASA, Lasix -Will check Urinalysis and urine culture  4. COPD: stable, no wheezes on lung exam, no accessory muscle use. Will continue O2 supplementation and albuterol inhalers -Will add Advair 250/81mcg inhaler  5. Hypotension: BP seems to be soft chronically.  This could also 2/2 dehydration. Will continue to monitor while rehydration. Hold Tramadol  6. Anemia: unclear etiology at this time with baseline Hb 9-10, will work up with anemia panel, FOBT. Will follow her CBC  DVT ppx: SCDs  Signed: Denton Ar 11/05/2011, 5:22 PM

## 2011-11-05 NOTE — ED Provider Notes (Signed)
I reviewed and interpreted the EKG during the patient's evaluation in the ED and agree with the resident's interpretation.  I saw and evaluated the patient, reviewed the resident's note and I agree with the findings and plan.   Celene Kras, MD 11/05/11 [phone]

## 2011-11-05 NOTE — ED Provider Notes (Signed)
Pt presents with persistent weakness, near syncope.  Pt also states that she has not been able to get out of bed and walk without feeling dizzy.    Cr is elevated once again.  Pt has had poor po intake, ?bulimia history.  Considering her renal insufficiency, will admit for hydration, serial electrolytes, ensure that her renal function does not worsen.  Celene Kras, MD 11/05/11 [phone]

## 2011-11-05 NOTE — ED Notes (Signed)
Pt states that she has dizziness x 1 week "every time I stand up." Pt reports multiple falls at home.

## 2011-11-05 NOTE — ED Notes (Signed)
Patient given food tray per EDP resident

## 2011-11-05 NOTE — ED Notes (Signed)
Pt states since she was discharge home she has been having problems coughing up green sputum.  She is here with complaints of heart racing last nite in her sleep and when she stands up she gets a dizzy feeling and feels like she might pass out.  Pt is on Lasix.  No pain now

## 2011-11-05 NOTE — ED Notes (Signed)
2 person assist to toilet. Pt c/o dizziness on ambulation.

## 2011-11-06 ENCOUNTER — Inpatient Hospital Stay (HOSPITAL_COMMUNITY): Payer: Medicare Other

## 2011-11-06 DIAGNOSIS — N179 Acute kidney failure, unspecified: Principal | ICD-10-CM

## 2011-11-06 LAB — CBC
MCH: 33 pg (ref 26.0–34.0)
MCV: 97.3 fL (ref 78.0–100.0)
Platelets: 79 10*3/uL — ABNORMAL LOW (ref 150–400)
RDW: 14.7 % (ref 11.5–15.5)

## 2011-11-06 LAB — BASIC METABOLIC PANEL
BUN: 43 mg/dL — ABNORMAL HIGH (ref 6–23)
CO2: 21 mEq/L (ref 19–32)
Calcium: 7.8 mg/dL — ABNORMAL LOW (ref 8.4–10.5)
Creatinine, Ser: 2.36 mg/dL — ABNORMAL HIGH (ref 0.50–1.10)
Glucose, Bld: 116 mg/dL — ABNORMAL HIGH (ref 70–99)

## 2011-11-06 LAB — CARDIAC PANEL(CRET KIN+CKTOT+MB+TROPI)
CK, MB: 1.1 ng/mL (ref 0.3–4.0)
Relative Index: INVALID (ref 0.0–2.5)
Total CK: 14 U/L (ref 7–177)
Troponin I: <0.3 ng/mL (ref <0.30)

## 2011-11-06 LAB — URINE MICROSCOPIC-ADD ON

## 2011-11-06 LAB — TECHNOLOGIST SMEAR REVIEW: Tech Review: DECREASED

## 2011-11-06 LAB — URINALYSIS, ROUTINE W REFLEX MICROSCOPIC
Bilirubin Urine: NEGATIVE
Ketones, ur: NEGATIVE mg/dL
Nitrite: NEGATIVE
Urobilinogen, UA: 0.2 mg/dL (ref 0.0–1.0)

## 2011-11-06 LAB — IRON AND TIBC
Saturation Ratios: 32 % (ref 20–55)
TIBC: 153 ug/dL — ABNORMAL LOW (ref 250–470)

## 2011-11-06 LAB — SEDIMENTATION RATE: Sed Rate: 55 mm/hr — ABNORMAL HIGH (ref 0–22)

## 2011-11-06 LAB — FOLATE: Folate: 4.6 ng/mL

## 2011-11-06 MED ORDER — SODIUM CHLORIDE 0.9 % IV SOLN
1000.0000 mL | INTRAVENOUS | Status: DC
  Administered 2011-11-06: 1000 mL via INTRAVENOUS

## 2011-11-06 MED ORDER — FLUOXETINE HCL 20 MG PO CAPS
20.0000 mg | ORAL_CAPSULE | Freq: Every day | ORAL | Status: DC
  Administered 2011-11-06 – 2011-11-07 (×2): 20 mg via ORAL
  Filled 2011-11-06 (×2): qty 1

## 2011-11-06 NOTE — H&P (Signed)
Internal Medicine Teaching Service Attending Note Date: 11/06/2011  Patient name: [name] [name]  Medical record number: [id number]  Date of birth: 1950/10/08   I have seen and evaluated [name] [name] and discussed their care with the Residency Team. Please see Dr Vinnie Level H&P for full details.   [name] [name] has several pul issues. 1. She has presumed COPD and has been started on albuterol MDI. She has never had PFT's. She does have a chronic productive cough and DOE. She also uses O2 at night and throughout the day based on her sxs.  2. Nocturnal hypoxia - She recently had a home nocturnal O2 study was was started on home nocturnal O2 the AM after the study.  3. Chronic interstitial lung disease with ground glass opacities - The ILD was thought to be 2/2 chronic aspiration. Both the ILD and ground glass opacities were seen as far back as 2009.  Her renal issues include 1. CRI - baseline Cr was just under 1.0 until admission 07/2011 when her Creatinine was 4.75. Was pre-renal & ACEI (intrinsic causes were W/U and the impression from D/C summary was that this was pre-renal and ACEI) and pt was D/C'd with Cr 1.72 with GFR 31.  2. Renal U/S 07/2011 was normal. 3. Follows with renal as outpt.  4. Treated for hyperkalemia as outpt 11/04/2011. Unknown creatinine at that time.  Her last admission raised some autoimmune autoimmune concerns. 1. c ANCA was 1:80, SSA 201, SSB 73, ds DNA Ab 35. 2. She has chronic dry mouth and tongue.  Her cardiac issues: 1. She has a h/o "CHF". Her last ECHO 07/2011 showed EF of 55-60%, nl LV wall motion, grade 1 diastolic dysfxn, mildly increased sys pul artery pressure at 32. 2. No other cardiac procedures found to support the dx of CHF  Her GI issues include EGD 07/2011 - minimal candidal esophagitis. Had gagging and retching during procedure that suggested aspiration.  She has chronic pain 1. Unclear etiology. States kidney pain that is relieved by taking lasix. Had  L1 compression fx on CT 10/2010. 2. Has tramadol for q 4 hrs and hydrocodone for QD. Got hydrocodone #30 on 2/25, 4/3, 5/2, and 6/6 so one almost every day.  She has chronic anxiety 1. On clonazepam 1 mg. Got #60 on 3/27, 4/29, 7/16.  2. Also on Prozac and celexa.  3. Baseline tremor B hands.   She was admitted with a slowly progressive course of weakness, dizziness, anorexia, fatigue, and falls. This has been occuring over two years but worse past 2 weeks. She has a chronic cough that is productive of thick sputum. She is able to get to her PA's appointments by using her walker with a seat and the physical support of her husband. She was able to get into [employer] about 2 days prior to admission - needed his support to get from bed to car but was then able to walk independently from car into the entrance (few steps) to get scooter. She has 5 steps into house and does OK with first 2 but then needs help with last three. Has to sit on porch to get breath then goes to bed and puts on O2.   She smoked about 45 pack yr hx. Now uses snuff. No ETOH use. No MMG in long time, no PAP for 27 yrs, no colon ca screening.   PMHX outlined above. Meds, soc hx, fam hx, allergies were reviewed.  Filed Vitals:   11/06/11 0157 11/06/11 0421 11/06/11 0532  11/06/11 0849  BP: 88/46 98/43 116/84   Pulse: 67  66   Temp: 97.8 F (36.6 C)  97.6 F (36.4 C)   TempSrc: Oral  Oral   Resp: 20  20   Height:      Weight:   257 lb 11.5 oz (116.9 kg)   SpO2: 100%  99% 98%   GEN : In bed, able to reposition herself independently. NAD HEENT : dry mucous membranes and tongue with fissures. Upper dentures, very bad lower teeth - only the front are present. Pupils reactive Neck : no bruits. No supraclavicular LAD, no cervical LAD. HRRR no MRG LCTAB ABD + BS, S/NT, ND Ext no edema. Tender to palp + DP pulses. Strength : grossly nl but some give away weakness. Finger to nose nl despite tremor B - got worse with intential  movements. Did not test ambulation. Skin ; increased pigmentation chest area and patches of increased pigmentation LE.   Studies and imaging were reviewed  Assessment and Plan: I agree with the formulated Assessment and Plan with the following changes:   1. Sxs complex of weakness, dizziness, fatigue, anorexia, & falls in setting of chronic lung disease, recent + autoimmune markers, and RF among other sxs and PMHx. Diff dx and W/U include A. Autoimmune disease, specifically Sjogrens's. This would fit with her dry mouth and tongue, poor detention, and ILD seen on CT. Will repeat markers and will need oupt Rheum F/U. B. Pancytopenia - her liver and spleen were nl on CT 12/2010. Will do blood smear & anemia panel. No causative meds ID'd. If persistent and no other dx are apparent, consider bone marrow bx. C. Infxn - no apparnet source ID. The cough and sputum are chronic and tx with doxy was tried last hospital stay. Her teeth are badly decayed and could be source of infxn and cause of anorexia so will start with panorex and consider oral surgery consult.  D. Lung dz - this is chronic. Will need to check O2 sat with ambulation. Pul saw pt last admit and recom no further inpt W/U but requested outpt F/U which we will arrange. Her pul status does seem to limit her ambulation E. Malignancy - she is not UTD on cancer screens. Will need outpt MMG, PAP, and colonoscopy. We will guaiac stools. Also need to consider blood dyscrasias.  F. Neuro - has the gait instability of NPH but not yet the cognitive nor incontinence. CT showed no enlarged ventricles 07/2011 so will not pursue MRI. Could consider parkinson's  - tremor - but not pill rolling. Need to assess gait. G. Progressive kidney failure - with hydration her creatinine is trending down. See if outpt renal has check EPO level as might help with her anemia. H. Meds - pt is on many meds that could interact (Celexa and prozac) and sedating (benzo and opioid).  Will stop Celexa. Has not been using benzo QD so will stop. Stop opioid.  I. Endocrine - TSH was nl 07/2011. Will check AM cortisol to assess for Addisons (hypotension, skin pigmentation, hyperK) J. Deconditioning - assess nutrition. Attempt to get teeth removed to decrease pain with eating. PT/OT. K. Chronic cardiac disease - cycle CE. Recent ECHO R/O L and R heart disease. Less likely the etiology of her sxs.   2. Acute on chronic kidney dz  3. Hypotension - hydrate and check cortisol.   Burns Spain, MD 8/20/20131:26 PM

## 2011-11-06 NOTE — Progress Notes (Signed)
Occupational Therapy Evaluation Patient Details Name: [name] [name] MRN: [id number] DOB: 11-08-1950 Today's Date: 11/06/2011 Time: 1700-1716 OT Time Calculation (min): 16 min  OT Assessment / Plan / Recommendation Clinical Impression  61 yo admitted with c/o dizziness and weakness. Pt currently at baseline level with  ADL and mobility for ADL. No equipment needs. Pt will need 24/7 S at D/C and states that husband will provide this. No further OT needs. OT signing off.    OT Assessment  Patient does not need any further OT services    Follow Up Recommendations  No OT follow up    Barriers to Discharge  none    Equipment Recommendations  None recommended by OT    Recommendations for Other Services    Frequency       Precautions / Restrictions Precautions Precautions: Fall   Pertinent Vitals/Pain none    ADL  Eating/Feeding: Performed;Independent Where Assessed - Eating/Feeding: Edge of bed Grooming: Performed;Supervision/safety Where Assessed - Grooming: Unsupported standing Upper Body Bathing: Simulated;Modified independent Where Assessed - Upper Body Bathing: Supported sitting Lower Body Bathing: Simulated;Modified independent Where Assessed - Lower Body Bathing: Supported sit to stand Upper Body Dressing: Simulated;Modified independent Where Assessed - Upper Body Dressing: Supported sitting Lower Body Dressing: Simulated;Modified independent Where Assessed - Lower Body Dressing: Unsupported sit to stand Toilet Transfer: Performed;Supervision/safety Toilet Transfer Method: Sit to Barista: Comfort height toilet Toileting - Clothing Manipulation and Hygiene: Performed;Modified independent Where Assessed - Toileting Clothing Manipulation and Hygiene: Standing Transfers/Ambulation Related to ADLs: supervision ADL Comments: Pt performing at baseline    OT Diagnosis:    OT Problem List:   OT Treatment Interventions:     OT Goals Acute Rehab OT  Goals OT Goal Formulation:  (eval only)  Visit Information  Last OT Received On: 11/06/11 Assistance Needed: +1    Subjective Data      Prior Functioning  Vision/Perception  Home Living Lives With: Spouse;Son Available Help at Discharge: Family;Available 24 hours/day Type of Home: Mobile home Home Access: Stairs to enter Entrance Stairs-Number of Steps: 5 Entrance Stairs-Rails: Left;Right;Can reach both Home Layout: One level Bathroom Shower/Tub: Engineer, manufacturing systems: Standard Home Adaptive Equipment: Other (comment);Walker - rolling;Straight cane (lift chair) Prior Function Level of Independence: Independent;Independent with assistive device(s) (uses a rollator for community) Able to Take Stairs?: No Driving:  (family walks with her up/down stairs) Vocation: Retired Musician: No difficulties Dominant Hand: Right   Vision - Assessment Eye Alignment: Within Chemical engineer Perception: Within Functional Limits Praxis Praxis: Intact  Cognition  Overall Cognitive Status: No family/caregiver present to determine baseline cognitive functioning Area of Impairment:  (Feel that pt is at baseline level of cognition) Arousal/Alertness: Awake/alert Orientation Level: Appears intact for tasks assessed Behavior During Session: Anxious Current Attention Level: Sustained Safety/Judgement: Decreased awareness of safety precautions;Decreased safety judgement for tasks assessed;Decreased awareness of need for assistance;Impulsive Cognition - Other Comments: pt almost appears drunk     Extremity/Trunk Assessment Right Upper Extremity Assessment RUE ROM/Strength/Tone: WFL for tasks assessed RUE Sensation: WFL - Light Touch;WFL - Proprioception RUE Coordination: WFL - gross/fine motor Left Upper Extremity Assessment LUE ROM/Strength/Tone: Within functional levels LUE Sensation: WFL - Light Touch;WFL - Proprioception LUE Coordination: WFL -  gross/fine motor Right Lower Extremity Assessment RLE ROM/Strength/Tone: Deficits RLE ROM/Strength/Tone Deficits: grossly 4/5, generalized weakness RLE Sensation: WFL - Light Touch Left Lower Extremity Assessment LLE ROM/Strength/Tone: Deficits LLE ROM/Strength/Tone Deficits: grossly 4/5, generalized weakness LLE Sensation: WFL - Light Touch  Mobility Bed Mobility Bed Mobility: Supine to Sit Supine to Sit: 6: Modified independent (Device/Increase time) Details for Bed Mobility Assistance: supine->sit not observed as pt sat up as soon as I left the room and the bed alarm went off, she reports no difficulty with it and no dizziness upon sitting up Transfers Transfers: Sit to Stand;Stand to Sit Sit to Stand: 5: Supervision;From bed;With upper extremity assist Stand to Sit: 5: Supervision;To bed;With upper extremity assist Details for Transfer Assistance: safe technique with RW   Exercise    Balance Static Standing Balance Static Standing - Balance Support: Bilateral upper extremity supported Static Standing - Level of Assistance: 4: Min assist  End of Session OT - End of Session Activity Tolerance: Patient tolerated treatment well Patient left: in bed;with call bell/phone within reach  GO     [facility] 11/06/2011, 5:23 PM

## 2011-11-06 NOTE — Care Management Note (Signed)
    Page 1 of 1   11/07/2011     3:26:34 PM   CARE MANAGEMENT NOTE 11/07/2011  Patient:  [name] [name]   Account Number:  [id number]  Date Initiated:  11/06/2011  Documentation initiated by:  Onnie Boer  Subjective/Objective Assessment:   PT WAS ADMITTED WITH DIZZINESS     Action/Plan:   PROGRESSION OF CARE AND DISCHARGE PLANNING   Anticipated DC Date:  11/08/2011   Anticipated DC Plan:  HOME W HOME HEALTH SERVICES      DC Planning Services  CM consult      Choice offered to / List presented to:             Status of service:  Completed, signed off Medicare Important Message given?   (If response is "NO", the following Medicare IM given date fields will be blank) Date Medicare IM given:   Date Additional Medicare IM given:    Discharge Disposition:  HOME/SELF CARE  Per UR Regulation:  Reviewed for med. necessity/level of care/duration of stay  If discussed at Long Length of Stay Meetings, dates discussed:    Comments:  11/06/11 Onnie Boer, RN, BSN 1151 PT WAS ADMITTED WITH DIZZINESS FROM HOME.  WILL F/U ON DC NEEDS AND PT/OT RECOMMENDATIONS.

## 2011-11-06 NOTE — Evaluation (Signed)
Physical Therapy Evaluation Patient Details Name: [name] [name] MRN: [id number] DOB: Oct 03, 1950 Today's Date: 11/06/2011 Time: 2595-6387 PT Time Calculation (min): 21 min  PT Assessment / Plan / Recommendation Clinical Impression  Ms. Domingo is 61 y/o female with history of COPD, CHF, hypertension, recent hospitalization in June for ARF, who presents with a chief complaint of dizziness. Presents to physical therapy today with generalized weakness and gait abnormalities as well as cognitive impairments limiting her functional independendence and safety at home. Per chart she has had falls in the past. Will benefit physical therapy  in the acute setting to address these and the below deficits so as to maximize functional indepdnence and safety for safe d/c home. Rec HHPT and strict 24 hour assist at home.     PT Assessment  Patient needs continued PT services    Follow Up Recommendations  Home health PT;Supervision/Assistance - 24 hour    Barriers to Discharge        Equipment Recommendations  None recommended by PT    Recommendations for Other Services OT consult   Frequency Min 3X/week    Precautions / Restrictions Precautions Precautions: Fall         Mobility  Bed Mobility Bed Mobility: Supine to Sit Supine to Sit: Other (comment) Details for Bed Mobility Assistance: supine->sit not observed as pt sat up as soon as I left the room and the bed alarm went off, she reports no difficulty with it and no dizziness upon sitting up Transfers Transfers: Sit to Stand;Stand to Sit Sit to Stand: 4: Min assist;3: Mod assist;From bed;With upper extremity assist Stand to Sit: 4: Min assist;To chair/3-in-1;To bed;With upper extremity assist Details for Transfer Assistance: mod cueing for safe hand placement with regards to RW (pt pulling on RW with RUEand RW pivoting out and away from pt but she continued to stand unsafely needing min-modA for stability and to replace RW within safe  limits); cues for safe hand placement and controlled descent again to and from the bed and again to the chair (pt reports she uses a lift chair at home)  Ambulation/Gait Ambulation/Gait Assistance: 4: Min assist Ambulation Distance (Feet): 30 Feet Assistive device: Rolling walker Ambulation/Gait Assistance Details: min tactile and mod verbal cues for safe negotiation of RW and stability, pt appears distracted and almost drunk when ambulating (floppy trunk and stepping almost scissoring especially with turns); pt unsafely abandoning RW in the bathroom needing cueing for safe technique, attentional deficits appear to be affecting her safety during ambulation    Exercises     PT Diagnosis: Difficulty walking;Abnormality of gait;Generalized weakness  PT Problem List: Decreased strength;Decreased activity tolerance;Decreased safety awareness;Decreased balance;Decreased mobility;Decreased cognition;Decreased coordination;Decreased knowledge of use of DME PT Treatment Interventions: DME instruction;Gait training;Stair training;Functional mobility training;Therapeutic activities;Therapeutic exercise;Balance training;Neuromuscular re-education;Cognitive remediation;Patient/family education   PT Goals Acute Rehab PT Goals PT Goal Formulation: With patient/family Time For Goal Achievement: 11/20/11 Potential to Achieve Goals: Good Pt will go Supine/Side to Sit: with modified independence PT Goal: Supine/Side to Sit - Progress: Goal set today Pt will go Sit to Supine/Side: with modified independence PT Goal: Sit to Supine/Side - Progress: Goal set today Pt will go Sit to Stand: with modified independence PT Goal: Sit to Stand - Progress: Goal set today Pt will go Stand to Sit: with modified independence PT Goal: Stand to Sit - Progress: Goal set today Pt will Ambulate: >150 feet;with modified independence;with least restrictive assistive device;with rolling walker PT Goal: Ambulate - Progress: Goal  set today  Pt will Go Up / Down Stairs: 3-5 stairs;with rail(s);with min assist PT Goal: Up/Down Stairs - Progress: Goal set today  Visit Information  Last PT Received On: 11/06/11 Assistance Needed: +1 (+2 for safety (pt impulsive and unsteady))    Subjective Data  Subjective: Im sleepy, I just get weak.    Prior Functioning  Home Living Lives With: Spouse;Son Available Help at Discharge: Family;Available 24 hours/day Type of Home: Mobile home Home Access: Stairs to enter Entrance Stairs-Number of Steps: 5 Entrance Stairs-Rails: Left;Right;Can reach both Home Layout: One level Bathroom Shower/Tub: Engineer, manufacturing systems: Standard Home Adaptive Equipment: Other (comment);Walker - rolling;Straight cane (raised toilet seat without rails) Prior Function Level of Independence: Independent Driving: No Vocation: Retired Musician: No difficulties    Cognition  Overall Cognitive Status: Impaired Area of Impairment: Attention;Safety/judgement;Awareness of deficits Arousal/Alertness: Lethargic Orientation Level: Appears intact for tasks assessed Behavior During Session: Flat affect Current Attention Level: Sustained Safety/Judgement: Decreased awareness of safety precautions;Decreased safety judgement for tasks assessed;Decreased awareness of need for assistance;Impulsive Cognition - Other Comments: pt almost appears drunk     Extremity/Trunk Assessment Right Upper Extremity Assessment RUE ROM/Strength/Tone: WFL for tasks assessed Left Upper Extremity Assessment LUE ROM/Strength/Tone: WFL for tasks assessed Right Lower Extremity Assessment RLE ROM/Strength/Tone: Deficits RLE ROM/Strength/Tone Deficits: grossly 4/5, generalized weakness RLE Sensation: WFL - Light Touch Left Lower Extremity Assessment LLE ROM/Strength/Tone: Deficits LLE ROM/Strength/Tone Deficits: grossly 4/5, generalized weakness LLE Sensation: WFL - Light Touch   Balance Static  Standing Balance Static Standing - Balance Support: Bilateral upper extremity supported Static Standing - Level of Assistance: 4: Min assist  End of Session PT - End of Session Equipment Utilized During Treatment: Gait belt Activity Tolerance: Patient tolerated treatment well Patient left: in chair;with call bell/phone within reach;with family/visitor present Nurse Communication: Mobility status  GP     [facility] HELEN 11/06/2011, 3:18 PM

## 2011-11-06 NOTE — Clinical Documentation Improvement (Signed)
CKD DOCUMENTATION CLARIFICATION QUERY   THIS DOCUMENT IS NOT A PERMANENT PART OF THE MEDICAL RECORD  TO RESPOND TO THE THIS QUERY, FOLLOW THE INSTRUCTIONS BELOW:  1. If needed, update documentation for the patient's encounter via the notes activity.  2. Access this query again and click edit on the In [employer].  3. After updating, or not, click F2 to complete all highlighted (required) fields concerning your review. Select "additional documentation in the medical record" OR "no additional documentation provided".  4. Click Sign note button.  5. The deficiency will fall out of your In Basket *Please let us know if you are not able to complete this workflow by phone or e-mail (listed below).  Please update your documentation within the medical record to reflect your response to this query.                                                                                        11/06/11   Dear Dr. Collier Bullock Marton Redwood,  In a better effort to capture your patient's severity of illness/SOI, risk of mortality/ROM, reflect appropriate length of stay and utilization of resources, a review of the patient medical record has revealed the following indicators.  PLEASE CLARIFY IN NOTES AND DC SUMMARY "ACUTE ON CHRONIC KIDNEY DISEASE". THANK YOU.  Possible Clinical Conditions?  - CKD Stage I -  GFR =90 - CKD Stage II - GFR 60-80 - CKD Stage III - GFR 30-59 - CKD Stage IV - GFR 15-29 - CKD Stage V - GFR < 15 - ESRD (End Stage Renal Disease) - Other Condition (please specify) - Cannot Clinically Determine  Supporting Information: - Risk Factors: hx Acute Renal Failure, COPD, kidney failure, debilitating dizziness, significant weight loss - Signs & Symptoms: Per ED Resident 8/19:"Acute on chronic renal failure" - Creatinine level (baseline and current): 1.7 to 3.0 on 8/19 - Calculated GFR: 8/19: 16/18 - Treatment: 1 L of IV fluids in ED and Lasix held for "a component of hypovolemia"   You may  use possible, probable, or suspect with inpatient documentation. possible, probable, suspected diagnoses MUST be documented at the time of discharge  Reviewed: additional documentation in the medical record   Thank You,  Beverley Fiedler RN Clinical Documentation Specialist: Pager: [phone] Health Information Management: [phone] [facility]

## 2011-11-06 NOTE — Progress Notes (Signed)
Subjective:  [name] [name] is sitting comfortably in bed this morning. Patient does not have much of an appetite this morning. She feels about the same as yesterday. She states that she needs her "nerve pill," referring to her Prozac. Patient says that dental pain sometimes hinders her eating. She states that she was scheduled for oral surgery at one point, but it was canceled due to her "high blood".   She has a 45-pack-year history of smoking, and she currently uses snuff. She continues to deny use of alcohol.  ROS: + for severe anxiety, depression, irritability, Raynaud's phenomenon, hair loss, hand cramping "while cutting steak," knee pain no rash with sun exposure, no dry eyes, no history of kidney problems before May, no FH of lupus or autoimmune disease, no joint pain, no morning stiffness  Objective: Vital signs in last 24 hours: Filed Vitals:   11/05/11 2301 11/06/11 0157 11/06/11 0421 11/06/11 0532  BP: 91/47 88/46 98/43  116/84  Pulse: 69 67  66  Temp: 97.5 F (36.4 C) 97.8 F (36.6 C)  97.6 F (36.4 C)  TempSrc: Oral Oral  Oral  Resp: 20 20  20   Height:      Weight:    257 lb 11.5 oz (116.9 kg)  SpO2: 100% 100%  99%   Weight change:   Intake/Output Summary (Last 24 hours) at 11/06/11 0825 Last data filed at 11/06/11 0600  Gross per 24 hour  Intake 1270.42 ml  Output    550 ml  Net 720.42 ml   Physical Exam Blood pressure 116/84, pulse 66, temperature 97.6 F (36.4 C), temperature source Oral, resp. rate 20, height 5\' 7"  (1.702 m), weight 257 lb 11.5 oz (116.9 kg), SpO2 98.00%. General: No acute distress, alert and oriented x 3, slow to respond to questions  HEENT: PERRL, EOMI, no lymphadenopathy, dry mucous membranes, upper dentures, no inferior molars, very poor dentition with gum recession and tooth decay Cardiovascular: Regular rate and rhythm, no murmurs, rubs or gallops  Respiratory: Clear to auscultation bilaterally, no wheezes, rales, or rhonchi  Abdomen:  Soft, nondistended, nontender, normoactive bowel sounds  Extremities: Trace edema. Warm and well-perfused, no clubbing, cyanosis. Midline scar over R knee. Skin: dry with hyperpigmentation over anterior lower extremities, scattered ecchymoses,  Neuro: Not anxious appearing, no depressed mood, normal affect  CN intact, grip strength decreased, other UE strength 5/5. Sensation in tact.   Lab Results: Basic Metabolic Panel:  Lab 31/54/00 0157 11/05/11 1223  NA 138 137  K 3.8 4.1  CL 109 104  CO2 21 22  GLUCOSE 116* 85  BUN 43* 48*  CREATININE 2.36* 3.00*  CALCIUM 7.8* 8.7  MG -- --  PHOS -- --   CBC:  Lab 11/06/11 0157 11/05/11 1223  WBC 3.7* 4.9  NEUTROABS -- 3.4  HGB 8.6* 10.5*  HCT 25.4* 31.7*  MCV 97.3 96.9  PLT 79* 107*   Cardiac Enzymes:  Lab 11/06/11 0157 11/05/11 1825  CKTOTAL 11 11  CKMB 1.1 1.2  CKMBINDEX -- --  TROPONINI <0.30 <0.30   Anemia Panel:  Lab 11/05/11 2145  VITAMINB12 --  FOLATE --  FERRITIN --  TIBC --  IRON --  RETICCTPCT 0.7   Urinalysis:  Lab 11/06/11 0223 11/05/11 1633  COLORURINE YELLOW YELLOW  LABSPEC 1.014 1.013  PHURINE 5.0 5.0  GLUCOSEU NEGATIVE NEGATIVE  HGBUR NEGATIVE NEGATIVE  BILIRUBINUR NEGATIVE NEGATIVE  KETONESUR NEGATIVE NEGATIVE  PROTEINUR NEGATIVE NEGATIVE  UROBILINOGEN 0.2 0.2  NITRITE NEGATIVE NEGATIVE  LEUKOCYTESUR MODERATE* MODERATE*  Studies/Results: Dg Chest 2 View  11/05/2011  *RADIOLOGY REPORT*  Clinical Data: Near syncope.  Irregular heart beat.  Weakness, shortness of breath.  CHEST - 2 VIEW  Comparison: 08/20/2011  Findings: Chronic interstitial disease and peribronchial thickening.  Heart is normal size.  No acute opacities or effusions.  No acute bony abnormality.  IMPRESSION: Chronic interstitial lung disease and chronic bronchitis.  No real change since prior study.   Original Report Authenticated By: Raelyn Number, M.D. ( 11/05/2011 12:44:05 )    Medications: I have reviewed the patient's  current medications.  Scheduled Meds:   . sodium chloride  1,000 mL Intravenous Once  . sodium chloride  1,000 mL Intravenous Once  . citalopram  20 mg Oral Daily  . docusate sodium  100 mg Oral BID  . Fluticasone-Salmeterol  1 puff Inhalation BID   Continuous Infusions:   . sodium chloride 1,000 mL (11/05/11 2243)  . sodium chloride 1,000 mL (11/05/11 2243)  . DISCONTD: sodium chloride     PRN Meds:.albuterol, promethazine, DISCONTD: Aclidinium Bromide    Echo 5/31: Left ventricle: The cavity size was normal. Wall thickness was normal. Systolic function was normal. The estimated ejection fraction was in the range of 55% to 60%. Wall motion was normal; there were no regional wall motion abnormalities. Doppler parameters are consistent with abnormal left ventricular relaxation (grade 1 diastolic dysfunction).   RENAL/URINARY TRACT ULTRASOUND COMPLETE - 08/16/11 Comparison: CT abdomen and pelvis of 10/20/2010  Findings:  Right Kidney: No hydronephrosis is seen. The right kidney  measures 9.2 cm sagittally.  Left Kidney: No hydronephrosis is noted. The left kidney measures  9.2 cm.  Bladder: The urinary bladder is decompressed and cannot be  evaluated.  IMPRESSION:  No hydronephrosis. The urinary bladder is decompressed and cannot  be evaluated.  Original Report Authenticated By: Joretta Bachelor, M.D.    Assessment/Plan:  Patient is a 61 year old lady with a history of COPD, CHF, hypertension, recent hospitalization in June for ARF, who presents with a chief complaint of weakness.   Fatigue/weakness: unclear etiology at this time. May be due to underlying chronic lung disease, systemic autoimmune/connective tissue disease in combination with deconditioning and poor PO intake. Patient is also on home narcotics, which may play a role in her fatigue. This is not a new problem, as patient has been admitted for similar complaints in May, with autoimmune work up, nephrology and  pulmonology consults.  -PT to eval -get records from PCP -Cortrosyn stimulation test - Autoimmune work up, especially Sjogren's given dry mouth and positive antibodies ( [place] in the past)  Acute on chronic kidney disease: Patient has a history of stage III kidney disease. Baseline creatinine is unknown at this time. Patient presented with a creatinine of 3, it was 1.7 at time of discharge in May 2013. Patient appears volume contracted, BUN elevated. Nephrology has consulted on this patient during last admission. Renal ultrasound was normal. A K. I thought to be secondary to use of lisinopril, Lasix, volume depletion. C-ANCA was found to be positive. Urine eosinophils negative -Repeat anti-GBM, ESR, CRP, ANCA, ANA, etc -Will give IVF and monitor BMP  -Hold nephrotoxic meds at this time including ASA, Lasix  -Will check Urinalysis and urine culture   Chronic interstitial lung disease -Patient is currently satting in the 90s on room air. She complains of dyspnea on exertion and chronic cough with light green sputum. Pulmonology has previously evaluated the patient and do not recommend lung biopsy as  it would not change management -PT to walk with pulse ox to evaluate for oxygen requirement  Weight loss -Records from patient's PCP indicate that her weight was 275 lbs in January, 2013. She is currently at 257 pounds. -Suspect a combination of poor dentition, nausea with meals, recent esophageal candidiasis -Outpatient mammogram to complete age appropriate screening  Poor dentition:  -Will obtain orthopantogram  Depression/anxiety -Records from PCP indicate that patient has been getting Klonopin BID for at least the past 8 months. The patient denied having taken any antianxiety medication besides Prozac. It is unclear why the patient came in on both Celexa and Prozac. She states she takes Prozac for her nerves, and she gets tremors if she does not take this medication. She admits to  significant anxiety and irritability as well as depressed mood. No suicidal ideation.   -Will discontinue the Celexa, continue Prozac. Consider increasing dose as an outpatient   Chest pain: appears chronic, likely musculoskeletal- cardiac etiology appears less likely she has been having pain daily which is relieved with what sounded like Nitroglycerin however, patient reports "aspirin" makes it better. EKG today did not show any acute ST elevation or depression except for diffused T wave flattening. CE x2 negative. -Hold ASA in setting of acute on chronic CKD  -May give PRN nitro SL for chest pain (holding parameters for BP)   COPD: stable, no wheezes on lung exam, no accessory muscle use. Will continue O2 supplementation and albuterol inhalers  -Will add Advair 250/7mcg inhaler   Anemia/thrombocytopenia - baseline hemoglobin 9-10, platelets ~120  -Follow CBC, f/u labs   Hypotension: Patient has a history of hypertension, however her blood pressure seems to be low when she presents here. Baseline blood pressure from PCP appears to be 120s to 130s systolic, 80s diastolic. This hypotension could be 2/2 dehydration or narcotic use. Will continue to monitor while rehydration. Hold Tramadol   DVT ppx: SCDs    LOS: 1 day   [place] Ar 11/06/2011, 8:25 AM

## 2011-11-06 NOTE — Progress Notes (Signed)
PT Cancellation Note  Treatment cancelled today due to patient receiving procedure or test. Off the floor for x-ray. Will f/u as time allows.  [facility] HELEN 11/06/2011, 11:00 AM Pager: [phone]

## 2011-11-07 LAB — URINE CULTURE: Colony Count: NO GROWTH

## 2011-11-07 LAB — CBC WITH DIFFERENTIAL/PLATELET
Basophils Relative: 0 % (ref 0–1)
Eosinophils Relative: 2 % (ref 0–5)
HCT: 25.6 % — ABNORMAL LOW (ref 36.0–46.0)
Hemoglobin: 8.6 g/dL — ABNORMAL LOW (ref 12.0–15.0)
Lymphocytes Relative: 18 % (ref 12–46)
Monocytes Relative: 9 % (ref 3–12)
Neutro Abs: 3.5 10*3/uL (ref 1.7–7.7)
Neutrophils Relative %: 71 % (ref 43–77)
RBC: 2.58 MIL/uL — ABNORMAL LOW (ref 3.87–5.11)
RDW: 14.8 % (ref 11.5–15.5)
WBC: 4.9 10*3/uL (ref 4.0–10.5)

## 2011-11-07 LAB — EXTRACTABLE NUCLEAR ANTIGEN ANTIBODY
SSA (Ro) (ENA) Antibody, IgG: 222 AU/mL — ABNORMAL HIGH (ref <30)
SSB (La) (ENA) Antibody, IgG: 84 AU/mL — ABNORMAL HIGH (ref <30)
Scleroderma (Scl-70) (ENA) Antibody, IgG: 2 AU/mL (ref <30)
Sm/rnp: 1 AU/mL (ref <30)
ds DNA Ab: 16 IU/mL (ref <30)

## 2011-11-07 LAB — BASIC METABOLIC PANEL
BUN: 33 mg/dL — ABNORMAL HIGH (ref 6–23)
CO2: 17 mEq/L — ABNORMAL LOW (ref 19–32)
Chloride: 112 mEq/L (ref 96–112)
Creatinine, Ser: 1.87 mg/dL — ABNORMAL HIGH (ref 0.50–1.10)
Glucose, Bld: 85 mg/dL (ref 70–99)
Potassium: 4.3 mEq/L (ref 3.5–5.1)

## 2011-11-07 LAB — RAPID URINE DRUG SCREEN, HOSP PERFORMED
Amphetamines: NOT DETECTED
Barbiturates: NOT DETECTED
Benzodiazepines: POSITIVE — AB
Cocaine: NOT DETECTED
Opiates: NOT DETECTED
Tetrahydrocannabinol: NOT DETECTED

## 2011-11-07 LAB — CORTISOL-AM, BLOOD: Cortisol - AM: 9.1 ug/dL (ref 4.3–22.4)

## 2011-11-07 MED ORDER — BIOTENE DRY MOUTH DT GEL: 1.0000 "application " | DENTAL | Status: DC | PRN

## 2011-11-07 MED ORDER — FLUTICASONE-SALMETEROL 250-50 MCG/DOSE IN AEPB: 1.0000 | INHALATION_SPRAY | Freq: Two times a day (BID) | RESPIRATORY_TRACT | Status: AC

## 2011-11-07 MED ORDER — CALCIUM-VITAMIN D 250-125 MG-UNIT PO TABS: 1.0000 | ORAL_TABLET | Freq: Every day | ORAL | Status: AC

## 2011-11-07 NOTE — Progress Notes (Signed)
Internal Medicine Teaching Service Attending Note Date: 11/07/2011  Patient name: [name] [name]  Medical record number: [id number]  Date of birth: 04-28-1950    This patient has been seen and discussed with the house staff. Please see their note for complete details. I concur with their findings with the following additions/corrections:  Ms Urschel feels better today and is sitting SOB. PT worked with pt and rec 24 hour assist and home PT. They commented that her gait appeared "drunk" like and that there was sig cognitive deficits that impair her gait.    Sxs complex of weakness, dizziness, fatigue, anorexia, & falls in setting of chronic lung disease, recent + autoimmune markers, and RF among other sxs and PMHx. Etiologies we considered included :  A. Autoimmune disease, specifically Sjogrens's. This would fit with her dry mouth and tongue, poor detention, and ILD seen on CT. Repeat markers pending. Needs outpt Rheum F/U. Start artificial salvia as she does have dry mouth and poor dentition. B. Pancytopenia - her liver and spleen were nl on CT 12/2010. Will do blood smear (normocytic normochromic anemia) & anemia panel. No causative meds ID'd. HgB low likely 2/2 chronic kidney disease and IVF on admission - there was no iron def on panel. WBC increased today although plts still low. C. Infxn - no apparnet source ID. The cough and sputum are chronic and tx with doxy was tried last hospital stay. Her teeth are badly decayed and could be source of infxn. Will see if oral surgery is able to inpt teeth abstraction in AM - if yes, keep inpt for teeth extrastion. Otherwise, outpt tx. D. Lung dz - this is chronic. Will need to check O2 sat with ambulation. Pul saw pt last admit and recom no further inpt W/U but requested outpt F/U which we will rec that her PCP consider at F/U. Her pul status does seem to limit her ambulation. The ILD could be c/w Sjogren's. E. Malignancy - she is not UTD on cancer  screens. Will need outpt MMG, PAP, and colonoscopy. We will guaiac stools. Also need to consider blood dyscrasias.  F. Neuro - has the gait instability of NPH, possible the cognitive dysfxn, but not yet incontinence. CT showed no enlarged ventricles 07/2011 so will not pursue MRI. Could consider parkinson's - tremor - but not pill rolling. G. Progressive kidney failure - with hydration, her creatinine returned to baseline. See if outpt renal has check EPO level as might help with her anemia. Stop lasix. Will need freq oupt creatinine checks. H. Meds - pt is on many meds that could interact (Celexa and prozac) and sedating (benzo and opioid). Will stop Celexa. Has not been using benzo QD so will stop. Stop opioid.  I. Endocrine - TSH was nl 07/2011. AM cortisol was nl as Addisons (hypotension, skin pigmentation, hyperK) was considered. J. Deconditioning - assess nutrition. Attempt to get teeth removed to decrease pain with eating. PT/OT - will need home PT. K. Chronic cardiac disease - CE negtative. Recent ECHO R/O L and R heart disease. Less likely the etiology of her sxs.    Creatinine at baseline. Will speak to oral surgery to see if they rec inpt or outpt teeth extraction. Possible D/C today or tomorrow. Home PT. Husband home to provide 24 hr assist.    BUTCHER,ELIZABETH 11/07/2011, 11:05 AM

## 2011-11-07 NOTE — Discharge Summary (Signed)
Internal Medicine Teaching [facility] Discharge Note  Name: [name] [name] MRN: [id number] DOB: 03-Apr-1950 61 y.o.  Date of Admission: 11/05/2011  1:56 PM Date of Discharge: 11/12/2011 Attending Physician: No att. providers found  Discharge Diagnosis: Principal Problem:  *Acute on chronic renal failure Active Problems:  Generalized weakness  Anemia  COPD (chronic obstructive pulmonary disease)   Discharge Medications: Medication List  As of 11/12/2011  2:08 PM   STOP taking these medications         citalopram 20 MG tablet      furosemide 40 MG tablet         TAKE these medications         albuterol 108 (90 BASE) MCG/ACT inhaler   Commonly known as: PROVENTIL HFA;VENTOLIN HFA   Inhale 2 puffs into the lungs every 6 (six) hours as needed. For shortness of breath      aspirin 81 MG EC tablet   Take 1 tablet (81 mg total) by mouth daily.      Biotene Dry Mouth Gel   Place 1 application onto teeth as needed.      calcium-vitamin D 250-125 MG-UNIT per tablet   Commonly known as: OSCAL   Take 1 tablet by mouth daily.      FLUoxetine 20 MG capsule   Commonly known as: PROZAC   Take 20 mg by mouth daily.      Fluticasone-Salmeterol 250-50 MCG/DOSE Aepb   Commonly known as: ADVAIR   Inhale 1 puff into the lungs 2 (two) times daily.      HYDROcodone-acetaminophen 10-325 MG per tablet   Commonly known as: NORCO   Take 1 tablet by mouth every 6 (six) hours as needed. For pain      promethazine 25 MG tablet   Commonly known as: PHENERGAN   Take 25 mg by mouth every 6 (six) hours as needed. For nausea      traMADol 50 MG tablet   Commonly known as: ULTRAM   Take 50 mg by mouth every 6 (six) hours as needed. For pain      TUDORZA PRESSAIR 400 MCG/ACT Aepb   Generic drug: Aclidinium Bromide   Inhale 1 puff into the lungs 2 (two) times daily as needed. For shortness of breath            Disposition and follow-up:   [name] [name] was discharged from [facility] in Stable condition.  At the hospital follow up visit please address the following issues  -Followup with rheumatology as an outpatient for workup of autoimmune disorder -Followup with oral surgery as outpatient -Lasix was stopped during this hospitalization, reevaluate for need of this medication -Home health PT -Consider increasing dose of Prozac -BMP, CBC -Continue anemia/thrombocytopenia workup -PFTs to evaluate for chronic lung disease   Follow-up Appointments: Follow-up Information    Follow up with [facility] D, MD.   Contact information:   [address] [place] [address] [phone]       Follow up with [facility]. (TOMORROW August 22 at 1:30 PM. Please call them for directions or questions and come early to fill out paperwork.)    Contact information:   [address] [place] [facility] [place] [address] [phone]         Discharge Orders    Future Orders Please Complete By Expires   Diet - low sodium heart healthy      Increase activity  slowly         Consultations:    Procedures Performed:  Dg Orthopantogram  11/06/2011  *RADIOLOGY REPORT*  Clinical Data: Poor dentition.  Left-sided tooth and gum pain.  ORTHOPANTOGRAM/PANORAMIC  Comparison: None.  Findings: The patient is largely edentulous with no maxillary teeth, and  most of the mandibular premolars and molars missing. There appears to be significant dental caries on the left involving particularly tooth #21 (arrows).  No definite periapical lucencies are seen.  IMPRESSION: As above.   Original Report Authenticated By: Elsie Stain, M.D.    Dg Chest 2 View  11/05/2011  *RADIOLOGY REPORT*  Clinical Data: Near syncope.  Irregular heart beat.  Weakness, shortness of breath.  CHEST - 2 VIEW  Comparison: 08/20/2011  Findings: Chronic interstitial disease and peribronchial thickening.  Heart is normal size.  No  acute opacities or effusions.  No acute bony abnormality.  IMPRESSION: Chronic interstitial lung disease and chronic bronchitis.  No real change since prior study.   Original Report Authenticated By: Cyndie Chime, M.D. ( 11/05/2011 12:44:05 )      Admission HPI: Patient is a 61 year old lady with a history of COPD, CHF, hypertension, recent hospitalization in June for ARF, who presents with a chief complaint of dizziness. She is noted to be a poor historian, ?cognitive impairment. She states that over the past 2 months she has experienced decreased appetite, fatigue, dizziness, weakness, and increased number of falls at home. She states that she stays mostly in bed and ambulates only several steps to get to the bathroom. When she does get up and walk, she had significant dizziness, and she feels her body "goes limp". She reports recent weight loss, decreased appetite, and nausea at the smell of food. The patient reports using oxygen at night at 1.5 L. She was given oxygen 3 months ago when she was told "her heart stopped" while she was sleeping.  At the time of examination, she reports having some chest pressure. She states she has this on a daily basis, but it resolved completely after taking an aspirin underneath her tongue. It is unclear whether she chews aspirin or whether she is referring to nitroglycerin. She states she has not taken her aspirin yet today.  Of note, patient thinks she is taking both Prozac and citalopram, one in the morning one at night. She says her PA put her on these medications 2 weeks ago.  Review of systems positive for coughing with thick green sputum production, constipation, dyspnea on exertion, shortness of breath, and she sleeps sitting up. She denies fever, chills, vomiting, joint pain, arthralgias.  Family history is significant for lethal MI in her father at age 40, MI in her mother at age 21, and also multiple siblings with MIs and heart failure.   Hospital Course  by problem list: Principal Problem:  *Acute on chronic renal failure Active Problems:  Generalized weakness  Anemia  COPD (chronic obstructive pulmonary disease)    Fatigue/weakness:  This patient presented with worsening chronic fatigue and weakness. This has been going on for over a year, but worsening in the past month. She has had previous workup for an autoimmune disorder and she was found to have positive ds DNA, c-ANCA, SSA, SSB, as well as chronic dry mouth symptoms. Chest x-ray showed chronic interstitial lung disease and chronic bronchitis but no acute opacities or effusions. Patient has had decreased by mouth intake recently as well as deconditioning as she spends most of her time in  bed. Morning cortisol level was checked and was found to be 9. Head CT did not show enlarged ventricles suggestive of normal pressure hydrocephalus. Vitamin D level was low at 18. PT evaluated the patient and recommended 24-hour supervision at home. Patient was sent home with home health PT, vitamin D supplementation, Biotene, and referral to rheumatology for ? Sjogren's or other autoimmune disease workup.  Acute on chronic kidney disease: Patient has a history of stage III kidney disease. Baseline creatinine appears to be 1.7-1.8. Patient presented with a creatinine of 3, it was 1.7 at time of discharge in May 2013. Patient appeared volume contracted, BUN elevated. Lasix was held, and patient was given IV fluids. Her creatinine normalized shortly after admission, back to baseline at the time of discharge. Suspect her AKI was precipitated by poor PO intake/Lasix, perhaps with underlying autoimmune process. Will continue to hold Lasix until outpatient followup.  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 She has a chronic cough with thick yellow/green sputum. During previous admission she was treated for purulent bronchitis with 7 days of  doxycycline, however, this did not have a significant effect on her sputum production. Before discharge, patient was walked with a pulse oximeter and she was found to have oxygen saturations in the mid-90s. She may benefit from a sleep study as an outpatient.  Weight loss  -Records from patient's PCP indicate that her weight was 275 lbs in January, 2013. She is currently at 257 pounds.  -Suspect a combination of poor dentition, nausea with meals, recent esophageal candidiasis  -Outpatient mammogram to complete age appropriate screening   Poor dentition:  -orthopantogram  without evidence of abscess, missing molars and premolars  -will set up outpatient f/u  with oral surgery for teeth removal, dentures    Hypotension: Patient has a history of hypertension, however her blood pressure  was low when she presented here. Baseline blood pressure from PCP appears to be 120s to 130s systolic, 80s diastolic. Patient's blood pressure normalized after fluids were given . Suspect her hypotension was 2/2 volume contraction, dehydration.   Discharge Vitals:  BP 120/65  Pulse 88  Temp 98.5 F (36.9 C) (Oral)  Resp 18  Ht 5\' 7"  (1.702 m)  Wt 251 lb 5.2 oz (114 kg)  BMI 39.36 kg/m2  SpO2 99%  Discharge Labs:  No results found for this or any previous visit (from the past 24 hour(s)).  Signed: [place] Ar 11/12/2011, 2:08 PM   Time Spent on Discharge: 30 minutes

## 2011-11-07 NOTE — Progress Notes (Signed)
Physical Therapy Treatment Patient Details Name: [name] [name] MRN: [id number] DOB: March 01, 1951 Today's Date: 11/07/2011 Time: 7829-5621 PT Time Calculation (min): 14 min  PT Assessment / Plan / Recommendation Comments on Treatment Session  Patient able to increase ambulation this session with what appeared to be increased safety awareness from previous session. Patient O2 98% on RA with ambulation (RN requested). Patient progressing towards goal. Will continue to work on attention with activity to ensure safety    Follow Up Recommendations  Home health PT;Supervision/Assistance - 24 hour    Barriers to Discharge        Equipment Recommendations  None recommended by PT    Recommendations for Other Services    Frequency Min 3X/week   Plan Discharge plan remains appropriate;Frequency remains appropriate    Precautions / Restrictions Precautions Precautions: Fall   Pertinent Vitals/Pain     Mobility  Bed Mobility Bed Mobility: Not assessed Transfers Sit to Stand: 5: Supervision;From toilet;From bed;With upper extremity assist Stand to Sit: 5: Supervision;With upper extremity assist;To toilet;To bed Details for Transfer Assistance: Cues for safety Ambulation/Gait Ambulation/Gait Assistance: 4: Min guard Ambulation Distance (Feet): 225 Feet Assistive device: Rolling walker Ambulation/Gait Assistance Details: Cues for safety with RW. Patient somewhat distracted but appeared to have some increased safety awareness from previous session. No scissoring noted this session and not LOB. Cues to stay close to RW Gait Pattern: Step-through pattern;Decreased stride length;Trunk flexed    Exercises     PT Diagnosis:    PT Problem List:   PT Treatment Interventions:     PT Goals Acute Rehab PT Goals PT Goal: Sit to Stand - Progress: Progressing toward goal PT Goal: Stand to Sit - Progress: Progressing toward goal PT Goal: Ambulate - Progress: Progressing toward goal  Visit  Information  Last PT Received On: 11/07/11 Assistance Needed: +1    Subjective Data  Subjective: i feel a little better today   Cognition  Overall Cognitive Status: Impaired Area of Impairment: Attention;Safety/judgement Arousal/Alertness: Awake/alert Orientation Level: Appears intact for tasks assessed Behavior During Session: [facility] for tasks performed Current Attention Level: Sustained Safety/Judgement: Impulsive;Decreased safety judgement for tasks assessed    Balance     End of Session PT - End of Session Equipment Utilized During Treatment: Gait belt Activity Tolerance: Patient tolerated treatment well Patient left: in bed;with call bell/phone within reach Nurse Communication: Mobility status   GP     Fredrich Birks 11/07/2011, 11:35 AM 11/07/2011 Fredrich Birks PTA [phone] pager [phone] office

## 2011-11-07 NOTE — Progress Notes (Signed)
Subjective:  [name] [name] is sitting comfortably in bed this morning. Patient does not have much of an appetite this morning. She feels about the same as yesterday. She states that she needs her "nerve pill," referring to her Prozac. Patient says that dental pain sometimes hinders her eating. She states that she was scheduled for oral surgery at one point, but it was canceled due to her "high blood".   She has a 45-pack-year history of smoking, and she currently uses snuff. She continues to deny use of alcohol.  ROS: + for severe anxiety, depression, irritability, Raynaud's phenomenon, hair loss, hand cramping "while cutting steak," knee pain no rash with sun exposure, no dry eyes, no history of kidney problems before May, no FH of lupus or autoimmune disease, no joint pain, no morning stiffness  Objective: Vital signs in last 24 hours: Filed Vitals:   11/06/11 2025 11/06/11 2200 11/07/11 0500 11/07/11 0600  BP:  138/63  115/63  Pulse:  78  107  Temp:  98.1 F (36.7 C)  98.6 F (37 C)  TempSrc:  Oral  Oral  Resp:  20  20  Height:      Weight:   251 lb 5.2 oz (114 kg)   SpO2: 98% 100%  98%   Weight change: 0 lb (0 kg)  Intake/Output Summary (Last 24 hours) at 11/07/11 0833 Last data filed at 11/07/11 0600  Gross per 24 hour  Intake    840 ml  Output      0 ml  Net    840 ml   Physical Exam Blood pressure 115/63, pulse 107, temperature 98.6 F (37 C), temperature source Oral, resp. rate 20, height 5\' 7"  (1.702 m), weight 251 lb 5.2 oz (114 kg), SpO2 98.00%. General: No acute distress, alert and oriented x 3, slow to respond to questions  HEENT: PERRL, EOMI, no lymphadenopathy, dry mucous membranes, upper dentures, no inferior molars, very poor dentition with gum recession and tooth decay Cardiovascular: Regular rate and rhythm, no murmurs, rubs or gallops  Respiratory: Clear to auscultation bilaterally, no wheezes, rales, or rhonchi  Abdomen: Soft, nondistended, nontender,  normoactive bowel sounds  Extremities: Trace edema. Warm and well-perfused, no clubbing, cyanosis. Midline scar over R knee. Skin: dry with hyperpigmentation over anterior lower extremities, scattered ecchymoses,  Neuro: Not anxious appearing, no depressed mood, normal affect  Bilateral intentional hand tremor noted. Patient remembered two out of three words on memory testing. On clock drawing, she drew the clock face correctly, but was unable to indicate the time with the clock hands. CN intact, grip strength decreased, other UE strength 5/5. Sensation in tact.   Lab Results: Basic Metabolic Panel:  Lab 11/07/11 4098 11/06/11 0157  NA 139 138  K 4.3 3.8  CL 112 109  CO2 17* 21  GLUCOSE 85 116*  BUN 33* 43*  CREATININE 1.87* 2.36*  CALCIUM 8.1* 7.8*  MG -- --  PHOS -- --   CBC:  Lab 11/07/11 0645 11/06/11 0157 11/05/11 1223  WBC 4.9 3.7* --  NEUTROABS PENDING -- 3.4  HGB 8.6* 8.6* --  HCT 25.6* 25.4* --  MCV 99.2 97.3 --  PLT 81* 79* --   Cardiac Enzymes:  Lab 11/06/11 1128 11/06/11 0157 11/05/11 1825  CKTOTAL 14 11 11   CKMB 1.2 1.1 1.2  CKMBINDEX -- -- --  TROPONINI <0.30 <0.30 <0.30   Anemia Panel:  Lab 11/05/11 2145  VITAMINB12 1394*  FOLATE 4.6  FERRITIN 533*  TIBC 153*  IRON  49  RETICCTPCT 0.7   Urinalysis:  Lab 11/06/11 0223 11/05/11 1633  COLORURINE YELLOW YELLOW  LABSPEC 1.014 1.013  PHURINE 5.0 5.0  GLUCOSEU NEGATIVE NEGATIVE  HGBUR NEGATIVE NEGATIVE  BILIRUBINUR NEGATIVE NEGATIVE  KETONESUR NEGATIVE NEGATIVE  PROTEINUR NEGATIVE NEGATIVE  UROBILINOGEN 0.2 0.2  NITRITE NEGATIVE NEGATIVE  LEUKOCYTESUR MODERATE* MODERATE*     Studies/Results: Dg Orthopantogram  11/06/2011  *RADIOLOGY REPORT*  Clinical Data: Poor dentition.  Left-sided tooth and gum pain.  ORTHOPANTOGRAM/PANORAMIC  Comparison: None.  Findings: The patient is largely edentulous with no maxillary teeth, and  most of the mandibular premolars and molars missing. There appears to  be significant dental caries on the left involving particularly tooth #21 (arrows).  No definite periapical lucencies are seen.  IMPRESSION: As above.   Original Report Authenticated By: Staci Righter, M.D.    Dg Chest 2 View  11/05/2011  *RADIOLOGY REPORT*  Clinical Data: Near syncope.  Irregular heart beat.  Weakness, shortness of breath.  CHEST - 2 VIEW  Comparison: 08/20/2011  Findings: Chronic interstitial disease and peribronchial thickening.  Heart is normal size.  No acute opacities or effusions.  No acute bony abnormality.  IMPRESSION: Chronic interstitial lung disease and chronic bronchitis.  No real change since prior study.   Original Report Authenticated By: Raelyn Number, M.D. ( 11/05/2011 12:44:05 )    Medications: I have reviewed the patient's current medications.  Scheduled Meds:    . docusate sodium  100 mg Oral BID  . FLUoxetine  20 mg Oral Daily  . Fluticasone-Salmeterol  1 puff Inhalation BID  . DISCONTD: citalopram  20 mg Oral Daily   Continuous Infusions:    . sodium chloride 1,000 mL (11/06/11 1045)  . DISCONTD: sodium chloride 1,000 mL (11/05/11 2243)   PRN Meds:.albuterol, promethazine    Echo 5/31: Left ventricle: The cavity size was normal. Wall thickness was normal. Systolic function was normal. The estimated ejection fraction was in the range of 55% to 60%. Wall motion was normal; there were no regional wall motion abnormalities. Doppler parameters are consistent with abnormal left ventricular relaxation (grade 1 diastolic dysfunction).   RENAL/URINARY TRACT ULTRASOUND COMPLETE - 08/16/11 Comparison: CT abdomen and pelvis of 10/20/2010  Findings:  Right Kidney: No hydronephrosis is seen. The right kidney  measures 9.2 cm sagittally.  Left Kidney: No hydronephrosis is noted. The left kidney measures  9.2 cm.  Bladder: The urinary bladder is decompressed and cannot be  evaluated.  IMPRESSION:  No hydronephrosis. The urinary bladder is decompressed and  cannot  be evaluated.  Original Report Authenticated By: Joretta Bachelor, M.D.    Assessment/Plan:  Patient is a 61 year old lady with a history of COPD, CHF, hypertension, recent hospitalization in June for ARF, who presents with a chief complaint of weakness.   Fatigue/weakness: unclear etiology. May be due to underlying chronic lung disease, systemic autoimmune/connective tissue disease in combination with deconditioning and poor PO intake. Patient is also on home narcotics, which may play a role in her fatigue. This is not a new problem, as patient has been admitted for similar complaints in May, with autoimmune work up, nephrology and pulmonology consults. Hypotension resolved, making adrenal insufficiency less likely. Also, A.m. cortisol level was 9. Considering normal pressure hydrocephalus, although head CT did not show enlarged ventricles. Patient is noted to have a wobbly gait. Denies urinary incontinence. Cognition seems mildly impaired. Vitamin D level was low at 18. -PT recommends home health PT, 24 hour supervision -PO vitamin D  Autoimmune disorder: ?sjogrens -Patient has positive ds DNA, c-ANCA, SSA, SSB, as well as symptoms of chronic dry mouth. She denies having dry eyes, morning stiffness, joint pain, rashes. -Her symptoms seemed to fit with possible Sjogren's syndrome -Recommend use of Biotene, use sugar free gum as needed -Will recommend outpatient f/u with rheumatology for further workup  Acute on chronic kidney disease: Patient has a history of stage III kidney disease. Baseline creatinine appears to be 1.7-1.8. Patient presented with a creatinine of 3, it was 1.7 at time of discharge in May 2013. Patient appeared volume contracted, BUN elevated. Nephrology has consulted on this patient during last admission. Renal ultrasound was normal. A K.I thought to be secondary to use of lisinopril, Lasix, volume depletion. C-ANCA was found to be positive. Urine eosinophils  negative -Creatinine has normalized, 1.87 this morning. Suspect her AKI was precipitated by poor PO intake/Lasix, perhaps with underlying autoimmune process. -f/u on repeat anti-GBM, ESR, CRP, ANCA, ANA, etc  -Will continue to hold Lasix on discharge, followup as outpatient. Pt sees Dr. Florene Glen.   Chronic interstitial lung disease -Patient is currently satting in the 90s on room air. She complains of dyspnea on exertion and chronic cough with light green sputum. Pulmonology has previously evaluated the patient and do not recommend lung biopsy as it would not change management -PT to walk with pulse ox to evaluate for oxygen requirement -Recommend sleep study as outpatient for ?OSA evaluation  Weight loss -Records from patient's PCP indicate that her weight was 275 lbs in January, 2013. She is currently at 257 pounds. -Suspect a combination of poor dentition, nausea with meals, recent esophageal candidiasis -Outpatient mammogram to complete age appropriate screening  Poor dentition:  -Will obtain orthopantogram - oral surgery consult. If unable to remove teeth as inpatient, will set up outpatient f/u  Depression/anxiety -Records from PCP indicate that patient has been getting Klonopin BID for at least the past 8 months. The patient denied having taken any antianxiety medication besides Prozac. It is unclear why the patient came in on both Celexa and Prozac. She states she takes Prozac for her nerves, and she gets tremors if she does not take this medication. She admits to significant anxiety and irritability as well as depressed mood. No suicidal ideation.   -Will discontinue the Celexa, continue Prozac. Consider increasing dose as an outpatient   Chest pain: Resolved. Appears chronic, likely musculoskeletal- cardiac etiology appears less likely she has been having pain daily which is relieved with what sounded like Nitroglycerin however, patient reports "aspirin" makes it better. EKG today did  not show any acute ST elevation or depression except for diffused T wave flattening. CE x3 negative.  -May give PRN nitro SL for chest pain (holding parameters for BP)   COPD: stable, no wheezes on lung exam, no accessory muscle use. Will continue O2 supplementation and albuterol inhalers  -Will add Advair 250/68mcg inhaler  -patient will need PFTs for formal diagnosis as an outpatient  Anemia/thrombocytopenia - baseline hemoglobin 9-10, platelets ~120  -no evidence of active bleeding -f/u as outpatient.  Hypotension: Patient has a history of hypertension, however her blood pressure seems to be low when she presents here. Baseline blood pressure from PCP appears to be 778E to 423N systolic, 36R diastolic.  -Patient's blood pressure has normalized. Suspect her hypotension was 2/2 volume contraction, dehydration   DVT ppx: SCDs    LOS: 2 days   Santa Lighter 11/07/2011, 8:33 AM

## 2011-11-08 LAB — ANCA SCREEN W REFLEX TITER
Atypical p-ANCA Screen: NEGATIVE
c-ANCA Screen: POSITIVE — AB
p-ANCA Screen: NEGATIVE

## 2011-11-27 DIAGNOSIS — G4700 Insomnia, unspecified: Secondary | ICD-10-CM | POA: Diagnosis not present

## 2011-11-27 DIAGNOSIS — G8929 Other chronic pain: Secondary | ICD-10-CM | POA: Diagnosis not present

## 2011-11-27 DIAGNOSIS — R112 Nausea with vomiting, unspecified: Secondary | ICD-10-CM | POA: Diagnosis not present

## 2011-11-27 DIAGNOSIS — N179 Acute kidney failure, unspecified: Secondary | ICD-10-CM | POA: Diagnosis not present

## 2011-12-19 DIAGNOSIS — N184 Chronic kidney disease, stage 4 (severe): Secondary | ICD-10-CM | POA: Diagnosis not present

## 2011-12-19 DIAGNOSIS — G4700 Insomnia, unspecified: Secondary | ICD-10-CM | POA: Diagnosis not present

## 2011-12-19 DIAGNOSIS — R17 Unspecified jaundice: Secondary | ICD-10-CM | POA: Diagnosis not present

## 2011-12-19 DIAGNOSIS — R609 Edema, unspecified: Secondary | ICD-10-CM | POA: Diagnosis not present

## 2011-12-21 DIAGNOSIS — R17 Unspecified jaundice: Secondary | ICD-10-CM | POA: Diagnosis not present

## 2011-12-28 DIAGNOSIS — R17 Unspecified jaundice: Secondary | ICD-10-CM | POA: Diagnosis not present

## 2011-12-28 DIAGNOSIS — I2510 Atherosclerotic heart disease of native coronary artery without angina pectoris: Secondary | ICD-10-CM | POA: Diagnosis not present

## 2012-01-21 DIAGNOSIS — E7800 Pure hypercholesterolemia, unspecified: Secondary | ICD-10-CM | POA: Diagnosis not present

## 2012-01-22 DIAGNOSIS — R5381 Other malaise: Secondary | ICD-10-CM | POA: Diagnosis not present

## 2012-01-22 DIAGNOSIS — R5383 Other fatigue: Secondary | ICD-10-CM | POA: Diagnosis not present

## 2012-01-28 DIAGNOSIS — R5383 Other fatigue: Secondary | ICD-10-CM | POA: Diagnosis not present

## 2012-01-28 DIAGNOSIS — G4700 Insomnia, unspecified: Secondary | ICD-10-CM | POA: Diagnosis not present

## 2012-01-29 DIAGNOSIS — I10 Essential (primary) hypertension: Secondary | ICD-10-CM | POA: Diagnosis not present

## 2012-01-29 DIAGNOSIS — I509 Heart failure, unspecified: Secondary | ICD-10-CM | POA: Diagnosis not present

## 2012-02-18 DIAGNOSIS — R5381 Other malaise: Secondary | ICD-10-CM | POA: Diagnosis not present

## 2012-02-18 DIAGNOSIS — N184 Chronic kidney disease, stage 4 (severe): Secondary | ICD-10-CM | POA: Diagnosis not present

## 2012-02-18 DIAGNOSIS — R5383 Other fatigue: Secondary | ICD-10-CM | POA: Diagnosis not present

## 2012-02-18 DIAGNOSIS — R609 Edema, unspecified: Secondary | ICD-10-CM | POA: Diagnosis not present

## 2012-02-18 DIAGNOSIS — G4700 Insomnia, unspecified: Secondary | ICD-10-CM | POA: Diagnosis not present

## 2012-03-26 DIAGNOSIS — Z79899 Other long term (current) drug therapy: Secondary | ICD-10-CM | POA: Diagnosis not present

## 2012-03-26 DIAGNOSIS — Z5181 Encounter for therapeutic drug level monitoring: Secondary | ICD-10-CM | POA: Diagnosis not present

## 2012-03-26 DIAGNOSIS — R5381 Other malaise: Secondary | ICD-10-CM | POA: Diagnosis not present

## 2012-03-26 DIAGNOSIS — N184 Chronic kidney disease, stage 4 (severe): Secondary | ICD-10-CM | POA: Diagnosis not present

## 2012-03-26 DIAGNOSIS — R5383 Other fatigue: Secondary | ICD-10-CM | POA: Diagnosis not present

## 2012-03-26 DIAGNOSIS — G4700 Insomnia, unspecified: Secondary | ICD-10-CM | POA: Diagnosis not present

## 2012-03-27 DIAGNOSIS — E7800 Pure hypercholesterolemia, unspecified: Secondary | ICD-10-CM | POA: Diagnosis not present

## 2012-03-27 DIAGNOSIS — I749 Embolism and thrombosis of unspecified artery: Secondary | ICD-10-CM | POA: Diagnosis not present

## 2012-03-27 DIAGNOSIS — R5381 Other malaise: Secondary | ICD-10-CM | POA: Diagnosis not present

## 2012-03-27 DIAGNOSIS — E039 Hypothyroidism, unspecified: Secondary | ICD-10-CM | POA: Diagnosis not present

## 2012-04-21 DIAGNOSIS — J209 Acute bronchitis, unspecified: Secondary | ICD-10-CM | POA: Diagnosis not present

## 2012-04-21 DIAGNOSIS — R5383 Other fatigue: Secondary | ICD-10-CM | POA: Diagnosis not present

## 2012-04-21 DIAGNOSIS — R5381 Other malaise: Secondary | ICD-10-CM | POA: Diagnosis not present

## 2012-04-21 DIAGNOSIS — G4700 Insomnia, unspecified: Secondary | ICD-10-CM | POA: Diagnosis not present

## 2012-06-17 DIAGNOSIS — R5381 Other malaise: Secondary | ICD-10-CM | POA: Diagnosis not present

## 2012-06-17 DIAGNOSIS — N184 Chronic kidney disease, stage 4 (severe): Secondary | ICD-10-CM | POA: Diagnosis not present

## 2012-06-17 DIAGNOSIS — R5383 Other fatigue: Secondary | ICD-10-CM | POA: Diagnosis not present

## 2012-06-17 DIAGNOSIS — Z79899 Other long term (current) drug therapy: Secondary | ICD-10-CM | POA: Diagnosis not present

## 2012-06-19 DIAGNOSIS — E7800 Pure hypercholesterolemia, unspecified: Secondary | ICD-10-CM | POA: Diagnosis not present

## 2012-06-19 DIAGNOSIS — R5381 Other malaise: Secondary | ICD-10-CM | POA: Diagnosis not present

## 2012-06-19 DIAGNOSIS — E559 Vitamin D deficiency, unspecified: Secondary | ICD-10-CM | POA: Diagnosis not present

## 2012-06-19 DIAGNOSIS — E039 Hypothyroidism, unspecified: Secondary | ICD-10-CM | POA: Diagnosis not present

## 2012-07-08 DIAGNOSIS — R5381 Other malaise: Secondary | ICD-10-CM | POA: Diagnosis not present

## 2012-07-08 DIAGNOSIS — R5383 Other fatigue: Secondary | ICD-10-CM | POA: Diagnosis not present

## 2012-07-08 DIAGNOSIS — L272 Dermatitis due to ingested food: Secondary | ICD-10-CM | POA: Diagnosis not present

## 2012-07-08 DIAGNOSIS — Z79899 Other long term (current) drug therapy: Secondary | ICD-10-CM | POA: Diagnosis not present

## 2012-07-08 DIAGNOSIS — G4700 Insomnia, unspecified: Secondary | ICD-10-CM | POA: Diagnosis not present

## 2012-07-23 DIAGNOSIS — N181 Chronic kidney disease, stage 1: Secondary | ICD-10-CM | POA: Diagnosis not present

## 2012-07-23 DIAGNOSIS — R5381 Other malaise: Secondary | ICD-10-CM | POA: Diagnosis not present

## 2012-07-23 DIAGNOSIS — R112 Nausea with vomiting, unspecified: Secondary | ICD-10-CM | POA: Diagnosis not present

## 2012-07-23 DIAGNOSIS — R5383 Other fatigue: Secondary | ICD-10-CM | POA: Diagnosis not present

## 2012-09-01 DIAGNOSIS — I509 Heart failure, unspecified: Secondary | ICD-10-CM | POA: Diagnosis not present

## 2012-09-01 DIAGNOSIS — I10 Essential (primary) hypertension: Secondary | ICD-10-CM | POA: Diagnosis not present

## 2013-01-01 DIAGNOSIS — G8929 Other chronic pain: Secondary | ICD-10-CM | POA: Diagnosis not present

## 2013-01-01 DIAGNOSIS — G4700 Insomnia, unspecified: Secondary | ICD-10-CM | POA: Diagnosis not present

## 2013-01-01 DIAGNOSIS — K219 Gastro-esophageal reflux disease without esophagitis: Secondary | ICD-10-CM | POA: Diagnosis not present

## 2013-01-01 DIAGNOSIS — J019 Acute sinusitis, unspecified: Secondary | ICD-10-CM | POA: Diagnosis not present

## 2013-01-22 DIAGNOSIS — N179 Acute kidney failure, unspecified: Secondary | ICD-10-CM | POA: Diagnosis not present

## 2013-01-22 DIAGNOSIS — N390 Urinary tract infection, site not specified: Secondary | ICD-10-CM | POA: Diagnosis not present

## 2013-02-16 DIAGNOSIS — G8929 Other chronic pain: Secondary | ICD-10-CM | POA: Diagnosis not present

## 2013-02-16 DIAGNOSIS — J019 Acute sinusitis, unspecified: Secondary | ICD-10-CM | POA: Diagnosis not present

## 2013-02-16 DIAGNOSIS — G4700 Insomnia, unspecified: Secondary | ICD-10-CM | POA: Diagnosis not present

## 2013-03-23 DIAGNOSIS — M549 Dorsalgia, unspecified: Secondary | ICD-10-CM | POA: Diagnosis not present

## 2013-03-23 DIAGNOSIS — I10 Essential (primary) hypertension: Secondary | ICD-10-CM | POA: Diagnosis not present

## 2013-03-23 DIAGNOSIS — N183 Chronic kidney disease, stage 3 unspecified: Secondary | ICD-10-CM | POA: Diagnosis not present

## 2013-03-23 DIAGNOSIS — J018 Other acute sinusitis: Secondary | ICD-10-CM | POA: Diagnosis not present

## 2013-06-18 DIAGNOSIS — R112 Nausea with vomiting, unspecified: Secondary | ICD-10-CM | POA: Diagnosis not present

## 2013-06-18 DIAGNOSIS — D538 Other specified nutritional anemias: Secondary | ICD-10-CM | POA: Diagnosis not present

## 2013-06-18 DIAGNOSIS — R5381 Other malaise: Secondary | ICD-10-CM | POA: Diagnosis not present

## 2013-06-18 DIAGNOSIS — G8929 Other chronic pain: Secondary | ICD-10-CM | POA: Diagnosis not present

## 2013-06-18 DIAGNOSIS — R5383 Other fatigue: Secondary | ICD-10-CM | POA: Diagnosis not present

## 2013-06-18 DIAGNOSIS — I10 Essential (primary) hypertension: Secondary | ICD-10-CM | POA: Diagnosis not present

## 2013-07-29 DIAGNOSIS — I10 Essential (primary) hypertension: Secondary | ICD-10-CM | POA: Diagnosis not present

## 2013-12-22 DIAGNOSIS — R5383 Other fatigue: Secondary | ICD-10-CM | POA: Diagnosis not present

## 2013-12-22 DIAGNOSIS — N390 Urinary tract infection, site not specified: Secondary | ICD-10-CM | POA: Diagnosis not present

## 2013-12-22 DIAGNOSIS — G8929 Other chronic pain: Secondary | ICD-10-CM | POA: Diagnosis not present

## 2013-12-22 DIAGNOSIS — N183 Chronic kidney disease, stage 3 (moderate): Secondary | ICD-10-CM | POA: Diagnosis not present

## 2014-01-11 DIAGNOSIS — N183 Chronic kidney disease, stage 3 (moderate): Secondary | ICD-10-CM | POA: Diagnosis not present

## 2014-01-11 DIAGNOSIS — L309 Dermatitis, unspecified: Secondary | ICD-10-CM | POA: Diagnosis not present

## 2014-01-11 DIAGNOSIS — R112 Nausea with vomiting, unspecified: Secondary | ICD-10-CM | POA: Diagnosis not present

## 2014-01-11 DIAGNOSIS — T7840XA Allergy, unspecified, initial encounter: Secondary | ICD-10-CM | POA: Diagnosis not present

## 2014-03-22 DIAGNOSIS — N179 Acute kidney failure, unspecified: Secondary | ICD-10-CM | POA: Diagnosis not present

## 2014-03-22 DIAGNOSIS — N183 Chronic kidney disease, stage 3 (moderate): Secondary | ICD-10-CM | POA: Diagnosis not present

## 2014-03-26 DIAGNOSIS — N179 Acute kidney failure, unspecified: Secondary | ICD-10-CM | POA: Diagnosis not present

## 2014-05-26 DIAGNOSIS — J449 Chronic obstructive pulmonary disease, unspecified: Secondary | ICD-10-CM | POA: Diagnosis not present

## 2014-05-26 DIAGNOSIS — M129 Arthropathy, unspecified: Secondary | ICD-10-CM | POA: Diagnosis not present

## 2014-05-26 DIAGNOSIS — I509 Heart failure, unspecified: Secondary | ICD-10-CM | POA: Diagnosis not present

## 2014-08-24 DIAGNOSIS — M129 Arthropathy, unspecified: Secondary | ICD-10-CM | POA: Diagnosis not present

## 2014-08-24 DIAGNOSIS — F419 Anxiety disorder, unspecified: Secondary | ICD-10-CM | POA: Diagnosis not present

## 2014-08-24 DIAGNOSIS — D649 Anemia, unspecified: Secondary | ICD-10-CM | POA: Diagnosis not present

## 2014-08-24 DIAGNOSIS — G894 Chronic pain syndrome: Secondary | ICD-10-CM | POA: Diagnosis not present

## 2014-09-27 ENCOUNTER — Encounter (HOSPITAL_COMMUNITY): Payer: Self-pay | Admitting: Nurse Practitioner

## 2014-09-27 ENCOUNTER — Emergency Department (HOSPITAL_COMMUNITY): Payer: Medicare Other

## 2014-09-27 ENCOUNTER — Emergency Department (HOSPITAL_COMMUNITY)
Admission: EM | Admit: 2014-09-27 | Discharge: 2014-09-27 | Disposition: A | Discharge status: Home / Self Care | Payer: Medicare Other | Attending: Emergency Medicine | Admitting: Emergency Medicine

## 2014-09-27 DIAGNOSIS — I209 Angina pectoris, unspecified: Secondary | ICD-10-CM | POA: Insufficient documentation

## 2014-09-27 DIAGNOSIS — Z79899 Other long term (current) drug therapy: Secondary | ICD-10-CM | POA: Diagnosis not present

## 2014-09-27 DIAGNOSIS — Z7951 Long term (current) use of inhaled steroids: Secondary | ICD-10-CM | POA: Insufficient documentation

## 2014-09-27 DIAGNOSIS — N189 Chronic kidney disease, unspecified: Secondary | ICD-10-CM | POA: Insufficient documentation

## 2014-09-27 DIAGNOSIS — F419 Anxiety disorder, unspecified: Secondary | ICD-10-CM | POA: Insufficient documentation

## 2014-09-27 DIAGNOSIS — M1990 Unspecified osteoarthritis, unspecified site: Secondary | ICD-10-CM | POA: Diagnosis not present

## 2014-09-27 DIAGNOSIS — I509 Heart failure, unspecified: Secondary | ICD-10-CM | POA: Insufficient documentation

## 2014-09-27 DIAGNOSIS — Z87891 Personal history of nicotine dependence: Secondary | ICD-10-CM | POA: Diagnosis not present

## 2014-09-27 DIAGNOSIS — I129 Hypertensive chronic kidney disease with stage 1 through stage 4 chronic kidney disease, or unspecified chronic kidney disease: Secondary | ICD-10-CM | POA: Diagnosis not present

## 2014-09-27 DIAGNOSIS — R609 Edema, unspecified: Secondary | ICD-10-CM

## 2014-09-27 DIAGNOSIS — R0602 Shortness of breath: Secondary | ICD-10-CM | POA: Diagnosis not present

## 2014-09-27 DIAGNOSIS — R600 Localized edema: Secondary | ICD-10-CM | POA: Diagnosis not present

## 2014-09-27 DIAGNOSIS — R2243 Localized swelling, mass and lump, lower limb, bilateral: Secondary | ICD-10-CM | POA: Diagnosis not present

## 2014-09-27 DIAGNOSIS — Z7982 Long term (current) use of aspirin: Secondary | ICD-10-CM | POA: Diagnosis not present

## 2014-09-27 DIAGNOSIS — J449 Chronic obstructive pulmonary disease, unspecified: Secondary | ICD-10-CM | POA: Diagnosis not present

## 2014-09-27 DIAGNOSIS — Z8669 Personal history of other diseases of the nervous system and sense organs: Secondary | ICD-10-CM | POA: Diagnosis not present

## 2014-09-27 LAB — CBC
HCT: 35.5 % — ABNORMAL LOW (ref 36.0–46.0)
Hemoglobin: 11.7 g/dL — ABNORMAL LOW (ref 12.0–15.0)
MCH: 32.5 pg (ref 26.0–34.0)
MCHC: 33 g/dL (ref 30.0–36.0)
MCV: 98.6 fL (ref 78.0–100.0)
Platelets: 146 10*3/uL — ABNORMAL LOW (ref 150–400)
RBC: 3.6 MIL/uL — AB (ref 3.87–5.11)
RDW: 13.9 % (ref 11.5–15.5)
WBC: 5.7 10*3/uL (ref 4.0–10.5)

## 2014-09-27 LAB — BASIC METABOLIC PANEL
ANION GAP: 3 — AB (ref 5–15)
BUN: 18 mg/dL (ref 6–20)
CALCIUM: 8.4 mg/dL — AB (ref 8.9–10.3)
CO2: 29 mmol/L (ref 22–32)
CREATININE: 1.19 mg/dL — AB (ref 0.44–1.00)
Chloride: 107 mmol/L (ref 101–111)
GFR, EST AFRICAN AMERICAN: 54 mL/min — AB (ref >60)
GFR, EST NON AFRICAN AMERICAN: 47 mL/min — AB (ref >60)
GLUCOSE: 91 mg/dL (ref 65–99)
Potassium: 3.3 mmol/L — ABNORMAL LOW (ref 3.5–5.1)
SODIUM: 139 mmol/L (ref 135–145)

## 2014-09-27 LAB — I-STAT TROPONIN, ED: Troponin i, poc: 0.01 ng/mL (ref 0.00–0.08)

## 2014-09-27 LAB — BRAIN NATRIURETIC PEPTIDE: B NATRIURETIC PEPTIDE 5: 74.8 pg/mL (ref 0.0–100.0)

## 2014-09-27 MED ORDER — FUROSEMIDE 10 MG/ML IJ SOLN
30.0000 mg | Freq: Once | INTRAMUSCULAR | Status: AC
  Administered 2014-09-27: 30 mg via INTRAVENOUS
  Filled 2014-09-27: qty 4

## 2014-09-27 NOTE — ED Provider Notes (Signed)
CSN: [id number]     Arrival date & time 09/27/14  1610 History   First MD Initiated Contact with Patient 09/27/14 1714     Chief Complaint  Patient presents with  . Leg Swelling     (Consider location/radiation/quality/duration/timing/severity/associated sxs/prior Treatment) HPI Comments: Patient with a history of COPD, CKD, and CHF presents today with a chief complaint of bilateral LE edema.  She reports that she has had increasing swelling of her lower extremities over the past 3 days.  She also reports associated DOE.  She denies any SOB at rest.  She denies PND or orthopnea.  She states that she is currently on 20 mg Lasix, which was increased from 10 mg by her PCP two weeks ago.  She states that she is taking the Lasix as directed.  She denies any chest pain.  She states that she had one episode of chest pain three days, which quickly resolved after taking Norco.  Pain lasted a couple of minutes.  She states that she was anxious at that time and was not doing anything exertional.  She denies nausea, vomiting, wheezing, cough, fever, or chills.  She denies history of DVT or PE.  No prolonged travel or surgeries in the past 4 weeks.  No exogenous estrogen use.   Past Medical History  Diagnosis Date  . Anxiety   . COPD (chronic obstructive pulmonary disease)   . Fluid retention   . Acute renal failure   . Shortness of breath   . Dizziness   . Angina   . Degenerative disorder of eye     Right eye  . CHF (congestive heart failure)   . Arthritis   . Hypertension    Past Surgical History  Procedure Laterality Date  . Cholecystectomy    . Knee surgery     Family History  Problem Relation Age of Onset  . Heart failure Mother   . Stroke Father    History  Substance Use Topics  . Smoking status: Former Smoker -- 30 years    Quit date: 03/19/2001  . Smokeless tobacco: Current User    Types: Snuff  . Alcohol Use: No   OB History    No data available     Review of Systems   All other systems reviewed and are negative.     Allergies  Review of patient's allergies indicates no known allergies.  Home Medications   Prior to Admission medications   Medication Sig Start Date End Date Taking? Authorizing Provider  Aclidinium Bromide (TUDORZA PRESSAIR) 400 MCG/ACT AEPB Inhale 1 puff into the lungs 2 (two) times daily as needed. For shortness of breath   Yes Historical Provider, MD  albuterol (PROVENTIL HFA;VENTOLIN HFA) 108 (90 BASE) MCG/ACT inhaler Inhale 2 puffs into the lungs every 6 (six) hours as needed. For shortness of breath   Yes Historical Provider, MD  aspirin 81 MG tablet Take 81 mg by mouth daily.   Yes Historical Provider, MD  calcium-vitamin D (OSCAL) 250-125 MG-UNIT per tablet Take 1 tablet by mouth daily. 11/07/11 11/06/12  Larey Seat, MD  Dentifrices (BIOTENE DRY MOUTH) GEL Place 1 application onto teeth as needed. Patient taking differently: Place 1 application onto teeth as needed. For dry mouth 11/07/11  Yes Larey Seat, MD  FLUoxetine (PROZAC) 20 MG capsule Take 20 mg by mouth daily.   Yes Historical Provider, MD  Fluticasone-Salmeterol (ADVAIR) 250-50 MCG/DOSE AEPB Inhale 1 puff into the lungs 2 (two) times daily. 11/07/11  Yes Larey SeatJenna M Kesty, MD  HYDROcodone-acetaminophen [facility](NORCO) 10-325 MG per tablet Take 1 tablet by mouth every 6 (six) hours as needed. For pain   Yes Historical Provider, MD  promethazine (PHENERGAN) 25 MG tablet Take 25 mg by mouth every 6 (six) hours as needed. For nausea   Yes Historical Provider, MD   BP 143/73 mmHg  Pulse 80  Temp(Src) 98.2 F (36.8 C) (Oral)  Resp 18  Ht 5\' 6"  (1.676 m)  Wt 257 lb 4 oz (116.688 kg)  BMI 41.54 kg/m2  SpO2 97% Physical Exam  Constitutional: She appears well-developed and well-nourished.  HENT:  Head: Normocephalic and atraumatic.  Mouth/Throat: Oropharynx is clear and moist.  Neck: Normal range of motion. Neck supple.  Cardiovascular: Normal rate, regular rhythm and normal  heart sounds.   Pulmonary/Chest: Effort normal and breath sounds normal. No respiratory distress. She has no wheezes. She has no rales. She exhibits no tenderness.  Musculoskeletal: Normal range of motion.  Symmetric bilateral pitting edema of lower extremities.    Neurological: She is alert.  Skin: Skin is warm and dry.  Psychiatric: She has a normal mood and affect.  Nursing note and vitals reviewed.   ED Course  Procedures (including critical care time) Labs Review Labs Reviewed  BASIC METABOLIC PANEL  CBC  BRAIN NATRIURETIC PEPTIDE    Imaging Review Dg Chest 2 View  09/27/2014   CLINICAL DATA:  Bilateral lower extremity swelling for 2 days. Weight gain and shortness of breath. Initial encounter.  EXAM: CHEST  2 VIEW  COMPARISON:  11/05/2011 radiographs.  FINDINGS: Stable cardiomegaly with increased interstitial prominence suspicious for interstitial edema. There is no confluent airspace opacity or significant pleural effusion. Probable 2.8 cm thin walled air cyst noted in the left perihilar region. An old rib fractures noted on the right. No acute osseous findings seen.  IMPRESSION: Increased interstitial prominence suspicious for congestive heart failure. No confluent airspace opacity or significant pleural effusion.   Electronically Signed   By: Carey BullocksWilliam  Veazey M.D.   On: 09/27/2014 18:32     EKG Interpretation None      MDM   Final diagnoses:  None   Patient presents today with bilateral lower extremity edema.  Labs unremarkable.  BNP is WNL.  CXR results as above showing increased interstitial prominence suspicious for congestive heart failure. No confluent airspace opacity or significant pleural effusion.  Patient given IV Lasix in the ED and instructed to increase her home dose to 40 mg over the next couple of days.  Patient instructed to follow up with PCP.  Also instructed to elevate feet and use compression stockings.  LE edema is symmetric.  Doubt DVT.  Patient denies  any CP or SOB.  Stable for discharge.  Return precautions given.    Santiago GladHeather Gertha Lichtenberg, PA-C 09/27/14 2332  Gerhard Munchobert Lockwood, MD 09/28/14 [phone]

## 2014-09-27 NOTE — ED Notes (Signed)
Pt arrived to room

## 2014-09-27 NOTE — ED Notes (Signed)
Patient is gone to xray 

## 2014-09-27 NOTE — ED Notes (Signed)
She c/o BLE swelling over past 2 days, states she has gained about 12 lbs in 2 days, states " i think my fluid pills stopped working." she reports SOB with exertion, denies pain. She did have chest pain but took a hydrocodone with full pain relief. A&Ox4, resp e/u

## 2014-09-27 NOTE — Discharge Instructions (Signed)
Increase the dose of your Lasix from 20 mg to 40 mg for the next 3 days.  Follow up with your Primary Care Physician in the next few days.  Elevate your legs above the level of your heart.   Peripheral Edema You have swelling in your legs (peripheral edema). This swelling is due to excess accumulation of salt and water in your body. Edema may be a sign of heart, kidney or liver disease, or a side effect of a medication. It may also be due to problems in the leg veins. Elevating your legs and using special support stockings may be very helpful, if the cause of the swelling is due to poor venous circulation. Avoid long periods of standing, whatever the cause. Treatment of edema depends on identifying the cause. Chips, pretzels, pickles and other salty foods should be avoided. Restricting salt in your diet is almost always needed. Water pills (diuretics) are often used to remove the excess salt and water from your body via urine. These medicines prevent the kidney from reabsorbing sodium. This increases urine flow. Diuretic treatment may also result in lowering of potassium levels in your body. Potassium supplements may be needed if you have to use diuretics daily. Daily weights can help you keep track of your progress in clearing your edema. You should call your caregiver for follow up care as recommended. SEEK IMMEDIATE MEDICAL CARE IF:   You have increased swelling, pain, redness, or heat in your legs.  You develop shortness of breath, especially when lying down.  You develop chest or abdominal pain, weakness, or fainting.  You have a fever. Document Released: 04/12/2004 Document Revised: 05/28/2011 Document Reviewed: 03/23/2009 [facility] Inc[facility] Patient Information 2015 [employer], [place]. This information is not intended to replace advice given to you by your health care provider. Make sure you discuss any questions you have with your health care provider.

## 2014-09-30 DIAGNOSIS — F411 Generalized anxiety disorder: Secondary | ICD-10-CM | POA: Diagnosis not present

## 2014-09-30 DIAGNOSIS — I10 Essential (primary) hypertension: Secondary | ICD-10-CM | POA: Diagnosis not present

## 2014-09-30 DIAGNOSIS — D649 Anemia, unspecified: Secondary | ICD-10-CM | POA: Diagnosis not present

## 2014-09-30 DIAGNOSIS — H8113 Benign paroxysmal vertigo, bilateral: Secondary | ICD-10-CM | POA: Diagnosis not present

## 2014-12-09 DIAGNOSIS — F419 Anxiety disorder, unspecified: Secondary | ICD-10-CM | POA: Diagnosis not present

## 2014-12-09 DIAGNOSIS — D649 Anemia, unspecified: Secondary | ICD-10-CM | POA: Diagnosis not present

## 2014-12-09 DIAGNOSIS — I10 Essential (primary) hypertension: Secondary | ICD-10-CM | POA: Diagnosis not present

## 2014-12-09 DIAGNOSIS — E119 Type 2 diabetes mellitus without complications: Secondary | ICD-10-CM | POA: Diagnosis not present

## 2015-04-21 DIAGNOSIS — F419 Anxiety disorder, unspecified: Secondary | ICD-10-CM | POA: Diagnosis not present

## 2015-04-21 DIAGNOSIS — E119 Type 2 diabetes mellitus without complications: Secondary | ICD-10-CM | POA: Diagnosis not present

## 2015-04-21 DIAGNOSIS — R609 Edema, unspecified: Secondary | ICD-10-CM | POA: Diagnosis not present

## 2015-04-21 DIAGNOSIS — I10 Essential (primary) hypertension: Secondary | ICD-10-CM | POA: Diagnosis not present

## 2015-04-21 DIAGNOSIS — R5383 Other fatigue: Secondary | ICD-10-CM | POA: Diagnosis not present

## 2015-04-28 DIAGNOSIS — F419 Anxiety disorder, unspecified: Secondary | ICD-10-CM | POA: Diagnosis not present

## 2015-04-28 DIAGNOSIS — I10 Essential (primary) hypertension: Secondary | ICD-10-CM | POA: Diagnosis not present

## 2015-04-28 DIAGNOSIS — H8149 Vertigo of central origin, unspecified ear: Secondary | ICD-10-CM | POA: Diagnosis not present

## 2015-04-28 DIAGNOSIS — E119 Type 2 diabetes mellitus without complications: Secondary | ICD-10-CM | POA: Diagnosis not present

## 2015-04-28 DIAGNOSIS — E669 Obesity, unspecified: Secondary | ICD-10-CM | POA: Diagnosis not present

## 2015-06-13 DIAGNOSIS — F419 Anxiety disorder, unspecified: Secondary | ICD-10-CM | POA: Diagnosis not present

## 2015-08-18 DIAGNOSIS — G894 Chronic pain syndrome: Secondary | ICD-10-CM | POA: Diagnosis not present

## 2015-12-22 DIAGNOSIS — F419 Anxiety disorder, unspecified: Secondary | ICD-10-CM | POA: Diagnosis not present

## 2015-12-22 DIAGNOSIS — Z2821 Immunization not carried out because of patient refusal: Secondary | ICD-10-CM | POA: Diagnosis not present

## 2015-12-22 DIAGNOSIS — Z6835 Body mass index (BMI) 35.0-35.9, adult: Secondary | ICD-10-CM | POA: Diagnosis not present

## 2015-12-22 DIAGNOSIS — E669 Obesity, unspecified: Secondary | ICD-10-CM | POA: Diagnosis not present

## 2015-12-22 DIAGNOSIS — F322 Major depressive disorder, single episode, severe without psychotic features: Secondary | ICD-10-CM | POA: Diagnosis not present

## 2015-12-22 DIAGNOSIS — Z23 Encounter for immunization: Secondary | ICD-10-CM | POA: Diagnosis not present

## 2015-12-22 DIAGNOSIS — Z79899 Other long term (current) drug therapy: Secondary | ICD-10-CM | POA: Diagnosis not present

## 2015-12-22 DIAGNOSIS — Z1389 Encounter for screening for other disorder: Secondary | ICD-10-CM | POA: Diagnosis not present

## 2015-12-22 DIAGNOSIS — E876 Hypokalemia: Secondary | ICD-10-CM | POA: Diagnosis not present

## 2015-12-22 DIAGNOSIS — Z9181 History of falling: Secondary | ICD-10-CM | POA: Diagnosis not present

## 2015-12-22 DIAGNOSIS — D649 Anemia, unspecified: Secondary | ICD-10-CM | POA: Diagnosis not present

## 2015-12-22 DIAGNOSIS — I509 Heart failure, unspecified: Secondary | ICD-10-CM | POA: Diagnosis not present

## 2015-12-23 DIAGNOSIS — Z136 Encounter for screening for cardiovascular disorders: Secondary | ICD-10-CM | POA: Diagnosis not present

## 2015-12-23 DIAGNOSIS — Z79899 Other long term (current) drug therapy: Secondary | ICD-10-CM | POA: Diagnosis not present

## 2015-12-23 DIAGNOSIS — N189 Chronic kidney disease, unspecified: Secondary | ICD-10-CM | POA: Diagnosis not present

## 2016-02-02 DIAGNOSIS — J449 Chronic obstructive pulmonary disease, unspecified: Secondary | ICD-10-CM | POA: Diagnosis not present

## 2016-02-02 DIAGNOSIS — F322 Major depressive disorder, single episode, severe without psychotic features: Secondary | ICD-10-CM | POA: Diagnosis not present

## 2016-02-02 DIAGNOSIS — Z789 Other specified health status: Secondary | ICD-10-CM | POA: Diagnosis not present

## 2016-02-02 DIAGNOSIS — N189 Chronic kidney disease, unspecified: Secondary | ICD-10-CM | POA: Diagnosis not present

## 2016-02-02 DIAGNOSIS — I509 Heart failure, unspecified: Secondary | ICD-10-CM | POA: Diagnosis not present

## 2016-02-02 DIAGNOSIS — F419 Anxiety disorder, unspecified: Secondary | ICD-10-CM | POA: Diagnosis not present

## 2016-02-02 DIAGNOSIS — D649 Anemia, unspecified: Secondary | ICD-10-CM | POA: Diagnosis not present

## 2016-02-02 DIAGNOSIS — R110 Nausea: Secondary | ICD-10-CM | POA: Diagnosis not present

## 2016-02-02 DIAGNOSIS — R531 Weakness: Secondary | ICD-10-CM | POA: Diagnosis not present

## 2016-02-02 DIAGNOSIS — Z9181 History of falling: Secondary | ICD-10-CM | POA: Diagnosis not present

## 2016-02-10 DIAGNOSIS — E876 Hypokalemia: Secondary | ICD-10-CM | POA: Diagnosis not present

## 2016-04-03 DIAGNOSIS — R29898 Other symptoms and signs involving the musculoskeletal system: Secondary | ICD-10-CM | POA: Diagnosis not present

## 2016-04-03 DIAGNOSIS — Z6833 Body mass index (BMI) 33.0-33.9, adult: Secondary | ICD-10-CM | POA: Diagnosis not present

## 2016-04-03 DIAGNOSIS — R42 Dizziness and giddiness: Secondary | ICD-10-CM | POA: Diagnosis not present

## 2016-04-03 DIAGNOSIS — I509 Heart failure, unspecified: Secondary | ICD-10-CM | POA: Diagnosis not present

## 2016-04-03 DIAGNOSIS — E876 Hypokalemia: Secondary | ICD-10-CM | POA: Diagnosis not present

## 2016-04-03 DIAGNOSIS — N189 Chronic kidney disease, unspecified: Secondary | ICD-10-CM | POA: Diagnosis not present

## 2016-05-11 DIAGNOSIS — R21 Rash and other nonspecific skin eruption: Secondary | ICD-10-CM | POA: Diagnosis not present

## 2016-05-11 DIAGNOSIS — J449 Chronic obstructive pulmonary disease, unspecified: Secondary | ICD-10-CM | POA: Diagnosis not present

## 2016-05-11 DIAGNOSIS — L299 Pruritus, unspecified: Secondary | ICD-10-CM | POA: Diagnosis not present

## 2016-05-14 DIAGNOSIS — R21 Rash and other nonspecific skin eruption: Secondary | ICD-10-CM | POA: Diagnosis not present

## 2016-06-29 DIAGNOSIS — Z6829 Body mass index (BMI) 29.0-29.9, adult: Secondary | ICD-10-CM | POA: Diagnosis not present

## 2016-06-29 DIAGNOSIS — J449 Chronic obstructive pulmonary disease, unspecified: Secondary | ICD-10-CM | POA: Diagnosis not present

## 2016-08-20 DIAGNOSIS — L299 Pruritus, unspecified: Secondary | ICD-10-CM | POA: Diagnosis not present

## 2016-08-20 DIAGNOSIS — F322 Major depressive disorder, single episode, severe without psychotic features: Secondary | ICD-10-CM | POA: Diagnosis not present

## 2016-08-20 DIAGNOSIS — N189 Chronic kidney disease, unspecified: Secondary | ICD-10-CM | POA: Diagnosis not present

## 2016-08-20 DIAGNOSIS — J449 Chronic obstructive pulmonary disease, unspecified: Secondary | ICD-10-CM | POA: Diagnosis not present

## 2016-08-20 DIAGNOSIS — Z6828 Body mass index (BMI) 28.0-28.9, adult: Secondary | ICD-10-CM | POA: Diagnosis not present

## 2016-08-20 DIAGNOSIS — D649 Anemia, unspecified: Secondary | ICD-10-CM | POA: Diagnosis not present

## 2016-08-20 DIAGNOSIS — F419 Anxiety disorder, unspecified: Secondary | ICD-10-CM | POA: Diagnosis not present

## 2016-08-20 DIAGNOSIS — Z79899 Other long term (current) drug therapy: Secondary | ICD-10-CM | POA: Diagnosis not present

## 2016-10-18 DIAGNOSIS — Z6828 Body mass index (BMI) 28.0-28.9, adult: Secondary | ICD-10-CM | POA: Diagnosis not present

## 2016-10-18 DIAGNOSIS — Z0000 Encounter for general adult medical examination without abnormal findings: Secondary | ICD-10-CM | POA: Diagnosis not present

## 2016-10-18 DIAGNOSIS — Z1211 Encounter for screening for malignant neoplasm of colon: Secondary | ICD-10-CM | POA: Diagnosis not present

## 2016-10-18 DIAGNOSIS — Z1389 Encounter for screening for other disorder: Secondary | ICD-10-CM | POA: Diagnosis not present

## 2016-10-18 DIAGNOSIS — Z9181 History of falling: Secondary | ICD-10-CM | POA: Diagnosis not present

## 2016-10-18 DIAGNOSIS — E669 Obesity, unspecified: Secondary | ICD-10-CM | POA: Diagnosis not present

## 2016-10-18 DIAGNOSIS — Z136 Encounter for screening for cardiovascular disorders: Secondary | ICD-10-CM | POA: Diagnosis not present

## 2016-11-22 DIAGNOSIS — Z6828 Body mass index (BMI) 28.0-28.9, adult: Secondary | ICD-10-CM | POA: Diagnosis not present

## 2016-11-22 DIAGNOSIS — M545 Low back pain: Secondary | ICD-10-CM | POA: Diagnosis not present

## 2016-12-27 DIAGNOSIS — N189 Chronic kidney disease, unspecified: Secondary | ICD-10-CM | POA: Diagnosis not present

## 2016-12-27 DIAGNOSIS — J449 Chronic obstructive pulmonary disease, unspecified: Secondary | ICD-10-CM | POA: Diagnosis not present

## 2016-12-27 DIAGNOSIS — Z789 Other specified health status: Secondary | ICD-10-CM | POA: Diagnosis not present

## 2016-12-27 DIAGNOSIS — L659 Nonscarring hair loss, unspecified: Secondary | ICD-10-CM | POA: Diagnosis not present

## 2016-12-27 DIAGNOSIS — F322 Major depressive disorder, single episode, severe without psychotic features: Secondary | ICD-10-CM | POA: Diagnosis not present

## 2016-12-27 DIAGNOSIS — I509 Heart failure, unspecified: Secondary | ICD-10-CM | POA: Diagnosis not present

## 2016-12-27 DIAGNOSIS — Z23 Encounter for immunization: Secondary | ICD-10-CM | POA: Diagnosis not present

## 2016-12-27 DIAGNOSIS — Z136 Encounter for screening for cardiovascular disorders: Secondary | ICD-10-CM | POA: Diagnosis not present

## 2016-12-27 DIAGNOSIS — E46 Unspecified protein-calorie malnutrition: Secondary | ICD-10-CM | POA: Diagnosis not present

## 2016-12-27 DIAGNOSIS — Z6828 Body mass index (BMI) 28.0-28.9, adult: Secondary | ICD-10-CM | POA: Diagnosis not present

## 2017-04-29 DIAGNOSIS — I509 Heart failure, unspecified: Secondary | ICD-10-CM | POA: Diagnosis not present

## 2017-04-29 DIAGNOSIS — N189 Chronic kidney disease, unspecified: Secondary | ICD-10-CM | POA: Diagnosis not present

## 2017-04-29 DIAGNOSIS — Z789 Other specified health status: Secondary | ICD-10-CM | POA: Diagnosis not present

## 2017-04-29 DIAGNOSIS — H548 Legal blindness, as defined in USA: Secondary | ICD-10-CM | POA: Diagnosis not present

## 2017-04-29 DIAGNOSIS — R634 Abnormal weight loss: Secondary | ICD-10-CM | POA: Diagnosis not present

## 2017-04-29 DIAGNOSIS — J449 Chronic obstructive pulmonary disease, unspecified: Secondary | ICD-10-CM | POA: Diagnosis not present

## 2017-04-29 DIAGNOSIS — L600 Ingrowing nail: Secondary | ICD-10-CM | POA: Diagnosis not present

## 2017-04-29 DIAGNOSIS — M6281 Muscle weakness (generalized): Secondary | ICD-10-CM | POA: Diagnosis not present

## 2017-04-29 DIAGNOSIS — R771 Abnormality of globulin: Secondary | ICD-10-CM | POA: Diagnosis not present

## 2017-04-29 DIAGNOSIS — Z6830 Body mass index (BMI) 30.0-30.9, adult: Secondary | ICD-10-CM | POA: Diagnosis not present

## 2017-04-29 DIAGNOSIS — E46 Unspecified protein-calorie malnutrition: Secondary | ICD-10-CM | POA: Diagnosis not present

## 2017-04-29 DIAGNOSIS — F419 Anxiety disorder, unspecified: Secondary | ICD-10-CM | POA: Diagnosis not present

## 2017-04-29 DIAGNOSIS — E440 Moderate protein-calorie malnutrition: Secondary | ICD-10-CM | POA: Diagnosis not present

## 2017-04-29 DIAGNOSIS — F322 Major depressive disorder, single episode, severe without psychotic features: Secondary | ICD-10-CM | POA: Diagnosis not present

## 2017-04-29 DIAGNOSIS — D649 Anemia, unspecified: Secondary | ICD-10-CM | POA: Diagnosis not present

## 2017-05-02 DIAGNOSIS — E440 Moderate protein-calorie malnutrition: Secondary | ICD-10-CM | POA: Diagnosis not present

## 2017-05-02 DIAGNOSIS — M6281 Muscle weakness (generalized): Secondary | ICD-10-CM | POA: Diagnosis not present

## 2017-05-02 DIAGNOSIS — E46 Unspecified protein-calorie malnutrition: Secondary | ICD-10-CM | POA: Diagnosis not present

## 2017-05-07 ENCOUNTER — Ambulatory Visit (INDEPENDENT_AMBULATORY_CARE_PROVIDER_SITE_OTHER): Payer: Medicare Other | Admitting: Podiatry

## 2017-05-07 ENCOUNTER — Encounter: Payer: Self-pay | Admitting: Podiatry

## 2017-05-07 VITALS — BP 91/61 | HR 68 | Ht 65.0 in | Wt 170.0 lb

## 2017-05-07 DIAGNOSIS — L600 Ingrowing nail: Secondary | ICD-10-CM

## 2017-05-07 DIAGNOSIS — M79676 Pain in unspecified toe(s): Secondary | ICD-10-CM

## 2017-05-07 NOTE — Patient Instructions (Signed)

## 2017-05-07 NOTE — Progress Notes (Signed)
Subjective:  Patient ID: [name] [name], female    DOB: 1950-05-23,  MRN: [id number]  Chief Complaint  Patient presents with  . Ingrown Toenail    possible ingrown toenail right 1st medial border    67 y.o. female presents with the above complaint. Reports painful ingrowing nail at the inside of the R great toe. Present for 2 weeks. Denies drainage or signs of infection. Denies prior treatment.  Past Medical History:  Diagnosis Date  . Acute renal failure (HCC)   . Angina   . Anxiety   . Arthritis   . CHF (congestive heart failure) (HCC)   . COPD (chronic obstructive pulmonary disease) (HCC)   . Degenerative disorder of eye    Right eye  . Dizziness   . Fluid retention   . Hypertension   . Shortness of breath    Past Surgical History:  Procedure Laterality Date  . CHOLECYSTECTOMY    . KNEE SURGERY      Current Outpatient Medications:  .  albuterol (PROVENTIL HFA;VENTOLIN HFA) 108 (90 BASE) MCG/ACT inhaler, Inhale 2 puffs into the lungs every 6 (six) hours as needed. For shortness of breath, Disp: , Rfl:  .  aspirin 81 MG tablet, Take 81 mg by mouth daily., Disp: , Rfl:  .  FLUoxetine (PROZAC) 20 MG capsule, Take 20 mg by mouth daily., Disp: , Rfl:  .  furosemide (LASIX) 40 MG tablet, Take 40 mg by mouth., Disp: , Rfl:  .  lansoprazole (PREVACID) 15 MG capsule, Take 15 mg by mouth daily at 12 noon., Disp: , Rfl:  .  potassium chloride SA (K-DUR,KLOR-CON) 20 MEQ tablet, Take 20 mEq by mouth 2 (two) times daily., Disp: , Rfl:  .  promethazine (PHENERGAN) 25 MG tablet, Take 25 mg by mouth every 6 (six) hours as needed. For nausea, Disp: , Rfl:  .  Aclidinium Bromide (TUDORZA PRESSAIR) 400 MCG/ACT AEPB, Inhale 1 puff into the lungs 2 (two) times daily as needed. For shortness of breath, Disp: , Rfl:  .  calcium-vitamin D (OSCAL) 250-125 MG-UNIT per tablet, Take 1 tablet by mouth daily., Disp: , Rfl:  .  Dentifrices (BIOTENE DRY MOUTH) GEL, Place 1 application onto teeth as needed.  (Patient not taking: Reported on 05/07/2017), Disp: , Rfl:  .  Fluticasone-Salmeterol (ADVAIR) 250-50 MCG/DOSE AEPB, Inhale 1 puff into the lungs 2 (two) times daily. (Patient not taking: Reported on 05/07/2017), Disp: 60 each, Rfl: 1 .  HYDROcodone-acetaminophen (NORCO) 10-325 MG per tablet, Take 1 tablet by mouth every 6 (six) hours as needed. For pain, Disp: , Rfl:   Allergies  Allergen Reactions  . Codeine     Feel Numb.    Review of Systems all systems reviewed negative except as noted in the HPI. Objective:   Vitals:   05/07/17 1021  BP: 91/61  Pulse: 68   General AA&O x3. Normal mood and affect.  Vascular Dorsalis pedis and posterior tibial pulses present 1+ and absent bilaterally  Capillary refill normal to all digits. Pedal hair growth diminished.  Neurologic Epicritic sensation grossly present.  Dermatologic No open lesions. Interspaces clear of maceration. Nails well groomed and normal in appearance.  Orthopedic: MMT 5/5 in dorsiflexion, plantarflexion, inversion, and eversion. Normal joint ROM without pain or crepitus.   Assessment & Plan:  Patient was evaluated and treated and all questions answered.  Ingrown Nail, right -Patient elects to proceed with ingrown toenail removal today -Ingrown nail avulsed. See procedure note. -No permanent procedure today out of  concern for decreased circulation. -Educated on post-procedure care including soaking. Written instructions provided.  Procedure: Excision of Ingrown Toenail Location: Right 1st toe medial nail borders. Anesthesia: Lidocaine 1% plain; 1.655mL and Marcaine 0.5% plain; 1.285mL, digital block. Skin Prep: Betadine. Dressing: Silvadene; dry, sterile, compression dressing. Technique: Following skin prep, the affected nail border was freed, split with a nail splitter, and avulsed.. The area was then cleansed and dressing applied. Patient tolerated the procedure well.  Return if symptoms worsen or fail to improve.

## 2017-05-09 DIAGNOSIS — H353134 Nonexudative age-related macular degeneration, bilateral, advanced atrophic with subfoveal involvement: Secondary | ICD-10-CM | POA: Diagnosis not present

## 2017-05-09 DIAGNOSIS — H5703 Miosis: Secondary | ICD-10-CM | POA: Diagnosis not present

## 2017-05-09 DIAGNOSIS — H25813 Combined forms of age-related cataract, bilateral: Secondary | ICD-10-CM | POA: Diagnosis not present

## 2017-08-29 DIAGNOSIS — Z6829 Body mass index (BMI) 29.0-29.9, adult: Secondary | ICD-10-CM | POA: Diagnosis not present

## 2017-08-29 DIAGNOSIS — F419 Anxiety disorder, unspecified: Secondary | ICD-10-CM | POA: Diagnosis not present

## 2017-08-29 DIAGNOSIS — Z79899 Other long term (current) drug therapy: Secondary | ICD-10-CM | POA: Diagnosis not present

## 2017-11-17 ENCOUNTER — Emergency Department (HOSPITAL_COMMUNITY): Payer: Medicare Other

## 2017-11-17 ENCOUNTER — Emergency Department (HOSPITAL_COMMUNITY)
Admission: EM | Admit: 2017-11-17 | Discharge: 2017-11-17 | Disposition: A | Discharge status: Home / Self Care | Payer: Medicare Other | Attending: Emergency Medicine | Admitting: Emergency Medicine

## 2017-11-17 ENCOUNTER — Encounter (HOSPITAL_COMMUNITY): Payer: Self-pay | Admitting: Emergency Medicine

## 2017-11-17 DIAGNOSIS — W010XXA Fall on same level from slipping, tripping and stumbling without subsequent striking against object, initial encounter: Secondary | ICD-10-CM | POA: Diagnosis not present

## 2017-11-17 DIAGNOSIS — I509 Heart failure, unspecified: Secondary | ICD-10-CM | POA: Diagnosis not present

## 2017-11-17 DIAGNOSIS — R0989 Other specified symptoms and signs involving the circulatory and respiratory systems: Secondary | ICD-10-CM | POA: Diagnosis not present

## 2017-11-17 DIAGNOSIS — Z79899 Other long term (current) drug therapy: Secondary | ICD-10-CM | POA: Insufficient documentation

## 2017-11-17 DIAGNOSIS — R079 Chest pain, unspecified: Secondary | ICD-10-CM | POA: Diagnosis not present

## 2017-11-17 DIAGNOSIS — J449 Chronic obstructive pulmonary disease, unspecified: Secondary | ICD-10-CM | POA: Diagnosis not present

## 2017-11-17 DIAGNOSIS — Y929 Unspecified place or not applicable: Secondary | ICD-10-CM | POA: Insufficient documentation

## 2017-11-17 DIAGNOSIS — S2231XA Fracture of one rib, right side, initial encounter for closed fracture: Secondary | ICD-10-CM | POA: Diagnosis not present

## 2017-11-17 DIAGNOSIS — Y939 Activity, unspecified: Secondary | ICD-10-CM | POA: Diagnosis not present

## 2017-11-17 DIAGNOSIS — Z87891 Personal history of nicotine dependence: Secondary | ICD-10-CM | POA: Diagnosis not present

## 2017-11-17 DIAGNOSIS — I110 Hypertensive heart disease with heart failure: Secondary | ICD-10-CM | POA: Insufficient documentation

## 2017-11-17 DIAGNOSIS — S299XXA Unspecified injury of thorax, initial encounter: Secondary | ICD-10-CM | POA: Diagnosis not present

## 2017-11-17 DIAGNOSIS — Y999 Unspecified external cause status: Secondary | ICD-10-CM | POA: Insufficient documentation

## 2017-11-17 DIAGNOSIS — R52 Pain, unspecified: Secondary | ICD-10-CM

## 2017-11-17 DIAGNOSIS — W19XXXA Unspecified fall, initial encounter: Secondary | ICD-10-CM

## 2017-11-17 DIAGNOSIS — R0781 Pleurodynia: Secondary | ICD-10-CM

## 2017-11-17 MED ORDER — DOXYCYCLINE HYCLATE 100 MG PO TABS
100.0000 mg | ORAL_TABLET | Freq: Once | ORAL | Status: AC
  Administered 2017-11-17: 100 mg via ORAL
  Filled 2017-11-17: qty 1

## 2017-11-17 MED ORDER — DOXYCYCLINE HYCLATE 100 MG PO CAPS: 100.0000 mg | ORAL_CAPSULE | Freq: Two times a day (BID) | ORAL | 0 refills | Status: AC

## 2017-11-17 MED ORDER — LIDOCAINE 5 % EX PTCH: 1.0000 | MEDICATED_PATCH | CUTANEOUS | 0 refills | Status: AC

## 2017-11-17 MED ORDER — ALBUTEROL SULFATE (2.5 MG/3ML) 0.083% IN NEBU
5.0000 mg | INHALATION_SOLUTION | Freq: Once | RESPIRATORY_TRACT | Status: AC
  Administered 2017-11-17: 5 mg via RESPIRATORY_TRACT
  Filled 2017-11-17: qty 6

## 2017-11-17 MED ORDER — ACETAMINOPHEN 325 MG PO TABS
650.0000 mg | ORAL_TABLET | Freq: Once | ORAL | Status: AC
  Administered 2017-11-17: 650 mg via ORAL
  Filled 2017-11-17: qty 2

## 2017-11-17 NOTE — ED Triage Notes (Addendum)
Pt fell last Monday (mechanical fall), states her pain has been moving all around.  "Hurts to take deep breaths." Pt states she "layed on the floor for 5 hours until I could get up."

## 2017-11-17 NOTE — Discharge Instructions (Addendum)
Rib injuries  You have sustained a rib injury.  There is evidence of a single rib fracture on the x-ray.  There could be other fractures that just do not show up on the x-ray.  There is also evidence of a possible developing infection in the lungs. Please take all of your antibiotics until finished!   You may develop abdominal discomfort or diarrhea from the antibiotic.  You may help offset this with probiotics which you can buy or get in yogurt. Do not eat or take the probiotics until 2 hours after your antibiotic.   Please adhere to the following instructions: Incentive spirometer: This device is used to ensure proper expansion of the lungs and to help prevent secondary issues, such as pneumonia.  Think of this as physical therapy for your lungs while you are injured.  Perform lung expansion exercises every 1-2 hours while awake.  Have an initial goal of 1000 mL and then work to increase this value.  Acetaminophen: May take acetaminophen (generic for Tylenol), as needed, for pain. Your daily total maximum amount of acetaminophen from all sources should be limited to 4000mg /day for persons without liver problems, or 2000mg /day for those with liver problems.  Other pain management: Many parts of pain management involve experimentation to find what works for you as an individual patient.  You may try lidocaine patches, topical pain relievers, or hot/cold packs.  You may also need to change your sleeping position.  This may involve sleeping propped up in a chair or with extra pillows.  Duration of pain: For bruising or contusions to the ribs, pain can last 4-6 weeks.  For rib fractures, you can expect to have discomfort for 6-12 weeks.  It should be noted that even if there are no obvious fractures on the x-rays, you may have what is called an occult fracture.  This simply means that the bone is broken, but is not broken enough to be noted on x-ray.  Follow-up: Please follow-up with a primary care  provider for any further management of this issue.  Any further pain management should also be handled by a primary care provider.  Return: Return to the ED should you begin to have significantly worsening pain, onset of shortness of breath (not just hesitancy to take a deep breath due to pain), fever over 100.3 F accompanied by cough, coughing up blood, or any other major concerns.

## 2017-11-17 NOTE — ED Notes (Signed)
Patient verbalizes understanding of discharge instructions. Opportunity for questioning and answers were provided. Armband removed by staff, pt discharged from ED.  

## 2017-11-17 NOTE — ED Notes (Signed)
Patient transported to X-ray 

## 2017-11-17 NOTE — ED Provider Notes (Signed)
[facility] EMERGENCY DEPARTMENT Provider Note   CSN: [id number] Arrival date & time: 11/17/17  0545     History   Chief Complaint Chief Complaint  Patient presents with  . Fall  . Chest Pain    HPI Corda Shutt is a 67 y.o. female.  HPI   Sherryll Skoczylas is a 67 y.o. female, with a history of COPD, HTN, and CHF, presenting to the ED with pain following a mechanical fall that occurred 6 days ago.  States she tripped and hit her chest on the floor.  Pain is moderate to severe, sharp/throbbing, nonradiating.  She has been using a topical roll-on pain reliever.  She endorses some increased coughing over the past several days. Denies fever/chills, head injury, LOC, nausea/vomiting, abdominal pain, increased shortness of breath, neck/back pain, numbness, weakness, or any other complaints.     Past Medical History:  Diagnosis Date  . Acute renal failure (HCC)   . Angina   . Anxiety   . Arthritis   . CHF (congestive heart failure) (HCC)   . COPD (chronic obstructive pulmonary disease) (HCC)   . Degenerative disorder of eye    Right eye  . Dizziness   . Fluid retention   . Hypertension   . Shortness of breath     Patient Active Problem List   Diagnosis Date Noted  . Acute on chronic renal failure (HCC) 11/05/2011  . Generalized weakness 11/05/2011  . Anemia 11/05/2011  . COPD (chronic obstructive pulmonary disease) (HCC) 11/05/2011  . Dysphagia 08/20/2011  . Purulent bronchitis (HCC) 08/20/2011  . Acute respiratory failure with hypoxia (HCC) 08/17/2011  . Pulmonary infiltrates 08/17/2011  . Abnormal CT scan, chest 08/17/2011  . Acute renal failure (HCC) 08/17/2011    Past Surgical History:  Procedure Laterality Date  . CHOLECYSTECTOMY    . KNEE SURGERY       OB History   None      Home Medications    Prior to Admission medications   Medication Sig Start Date End Date Taking? Authorizing Provider  Aclidinium Bromide (TUDORZA PRESSAIR)  400 MCG/ACT AEPB Inhale 1 puff into the lungs 2 (two) times daily as needed. For shortness of breath   Yes [provider]  albuterol (PROVENTIL HFA;VENTOLIN HFA) 108 (90 BASE) MCG/ACT inhaler Inhale 2 puffs into the lungs every 6 (six) hours as needed. For shortness of breath   Yes [provider]  aspirin 81 MG tablet Take 81 mg by mouth daily.   Yes [provider]  cholecalciferol (VITAMIN D) 1000 units tablet Take 1,000 Units by mouth daily.   Yes [provider]  clonazePAM (KLONOPIN) 1 MG tablet Take 1 mg by mouth 2 (two) times daily.   Yes [provider]  Coenzyme Q10 (COQ10) 200 MG CAPS Take 1 capsule by mouth daily.   Yes [provider]  FLUoxetine (PROZAC) 20 MG capsule Take 20 mg by mouth daily.   Yes [provider]  furosemide (LASIX) 40 MG tablet Take 40 mg by mouth daily as needed for fluid.    Yes [provider]  HYDROcodone-acetaminophen (NORCO) 10-325 MG per tablet Take 1 tablet by mouth every 6 (six) hours as needed. For pain   Yes [provider]  Iron-FA-B Cmp-C-Biot-Probiotic (FUSION PLUS PO) Take 1 capsule by mouth daily.   Yes [provider]  potassium chloride SA (K-DUR,KLOR-CON) 20 MEQ tablet Take 20 mEq by mouth daily.    Yes [provider]  promethazine (PHENERGAN) 25 MG tablet Take 25 mg by mouth every 6 (six) hours as needed. For nausea   Yes [provider]  calcium-vitamin D (OSCAL) 250-125 MG-UNIT per tablet Take 1 tablet by mouth daily. Patient not taking: Reported on 11/17/2017 11/07/11 11/06/12  Larey Seat, MD  Dentifrices (BIOTENE DRY MOUTH) GEL Place 1 application onto teeth as needed. Patient not taking: Reported on 05/07/2017 11/07/11   Larey Seat, MD  doxycycline (VIBRAMYCIN) 100 MG capsule Take 1 capsule (100 mg total) by mouth 2 (two) times daily for 7 days. 11/17/17 11/24/17  Joy, Shawn C, PA-C  Fluticasone-Salmeterol (ADVAIR) 250-50 MCG/DOSE  AEPB Inhale 1 puff into the lungs 2 (two) times daily. Patient not taking: Reported on 05/07/2017 11/07/11   Larey Seat, MD  lidocaine (LIDODERM) 5 % Place 1 patch onto the skin daily. Remove & Discard patch within 12 hours or as directed by MD 11/17/17   Anselm Pancoast, PA-C    Family History Family History  Problem Relation Age of Onset  . Heart failure Mother   . Stroke Father     Social History Social History   Tobacco Use  . Smoking status: Former Smoker    Years: 30.00    Last attempt to quit: 03/19/2001    Years since quitting: 16.6  . Smokeless tobacco: Current User    Types: Snuff  Substance Use Topics  . Alcohol use: No  . Drug use: No     Allergies   Codeine   Review of Systems Review of Systems  Constitutional: Negative for chills and fever.  Respiratory: Positive for cough. Negative for shortness of breath.   Gastrointestinal: Negative for abdominal pain, nausea and vomiting.  Musculoskeletal: Negative for arthralgias, back pain and neck pain.       Anterior chest soreness  Neurological: Negative for dizziness, syncope, weakness, light-headedness, numbness and headaches.  All other systems reviewed and are negative.    Physical Exam Updated Vital Signs BP 130/65   Pulse 79   Temp 98.7 F (37.1 C) (Oral)   Resp 18   Ht 5\' 5"  (1.651 m)   Wt 77.1 kg   SpO2 94%   BMI 28.29 kg/m   Physical Exam  Constitutional: She appears well-developed and well-nourished. No distress.  HENT:  Head: Normocephalic and atraumatic.  Eyes: Conjunctivae are normal.  Neck: Neck supple.  Cardiovascular: Normal rate, regular rhythm, normal heart sounds and intact distal pulses.  Pulmonary/Chest: Effort normal and breath sounds normal. No respiratory distress. She exhibits tenderness. She exhibits no crepitus, no edema, no deformity and no swelling.  Tenderness to the region indicated.  No noted deformity, crepitus, swelling, bruising, or instability.  No increased work of  breathing.    Abdominal: Soft. There is no tenderness. There is no guarding.  Musculoskeletal: She exhibits no edema.  Lymphadenopathy:    She has no cervical adenopathy.  Neurological: She is alert.  Skin: Skin is warm and dry. She is not diaphoretic.  Psychiatric: She has a normal mood and affect. Her behavior is normal.  Nursing note and vitals reviewed.    ED Treatments / Results  Labs (all labs ordered are listed, but only abnormal results are displayed) Labs Reviewed - No data to display  EKG EKG Interpretation  Date/Time:  Sunday November 17 2017 06:08:00 EDT Ventricular Rate:  74 PR Interval:    QRS Duration: 87 QT Interval:  385 QTC Calculation: 428 R Axis:   57 Text Interpretation:  Sinus rhythm Baseline wander in lead(s) V3 Confirmed by Geoffery Lyons ([id number]) on 11/17/2017 6:52:13 AM   Radiology Dg Chest 2 View  Result Date: 11/17/2017 CLINICAL DATA:  67 year old female with fall and right chest wall pain. EXAM: CHEST - 2 VIEW; RIGHT RIBS - 2 VIEW COMPARISON:  Chest radiograph dated 09/27/2014 and CT dated 08/17/2011 FINDINGS: There is diffuse interstitial coarsening and probable background of mild emphysema. Overall there is interval increase in hazy and nodular density in the right lower lobe since the prior radiograph and therefore developing infiltrate is not excluded. Clinical correlation is recommended. There is blunting of the right costophrenic angle which appears new since the prior radiograph and may represent trace pleural effusion. There is no pneumothorax. The cardiac silhouette is within normal limits. There is an apparent minimally displaced fracture of the anterior aspect of the right eighth rib. Old healed fracture of the posterior right fourth rib. IMPRESSION: 1. Probable acute fracture of the anterior right eighth rib 2. Diffuse chronic interstitial coarsening. Increased airspace density in the right lower lobe may represent developing infiltrate.  Clinical correlation is recommended. Electronically Signed   By: Elgie Collard M.D.   On: 11/17/2017 06:54   Dg Ribs Unilateral Right  Result Date: 11/17/2017 CLINICAL DATA:  67 year old female with fall and right chest wall pain. EXAM: CHEST - 2 VIEW; RIGHT RIBS - 2 VIEW COMPARISON:  Chest radiograph dated 09/27/2014 and CT dated 08/17/2011 FINDINGS: There is diffuse interstitial coarsening and probable background of mild emphysema. Overall there is interval increase in hazy and nodular density in the right lower lobe since the prior radiograph and therefore developing infiltrate is not excluded. Clinical correlation is recommended. There is blunting of the right costophrenic angle which appears new since the prior radiograph and may represent trace pleural effusion. There is no pneumothorax. The cardiac silhouette is within normal limits. There is an apparent minimally displaced fracture of the anterior aspect of the right eighth rib. Old healed fracture of the posterior right fourth rib. IMPRESSION: 1. Probable acute fracture of the anterior right eighth rib 2. Diffuse chronic interstitial coarsening. Increased airspace density in the right lower lobe may represent developing infiltrate. Clinical correlation is recommended. Electronically Signed   By: Elgie Collard M.D.   On: 11/17/2017 06:54    Procedures Procedures (including critical care time)  Medications Ordered in ED Medications  albuterol (PROVENTIL) (2.5 MG/3ML) 0.083% nebulizer solution 5 mg (5 mg Nebulization Given 11/17/17 0637)  acetaminophen (TYLENOL) tablet 650 mg (650 mg Oral Given 11/17/17 0749)  doxycycline (VIBRA-TABS) tablet 100 mg (100 mg Oral Given 11/17/17 0749)     Initial Impression / Assessment and Plan / ED Course  I have reviewed the triage vital signs and the nursing notes.  Pertinent labs & imaging results that were available during my care of the patient were reviewed by me and considered in my medical decision  making (see chart for details).     Patient presents with rib pain following a mechanical fall 6 days ago.  She is nontoxic-appearing, not tachycardic, not hypotensive, not tachypneic, and maintains adequate SPO2 on room air. Anterior eighth rib fracture noted on chest x-ray.  Increased right lower lobe density also noted.  Due to the patient's recent injury causing an avoidance of deep breathing and this x-ray finding, we will initiate antibiotic therapy. We discussed pain management options.  Patient has had a few recent falls, which she states were all mechanical.  She suspects she will get adequate  pain relief with topicals and Tylenol.  For this reason, shared decision-making was utilized regarding narcotic analgesics.  We have decided to forego them at this time. PCP follow-up encouraged. The patient was given instructions for home care, including incentive spirometry, as well as return precautions. Patient voices understanding of these instructions, accepts the plan, and is comfortable with discharge.  Findings and plan of care discussed with Emily Filbert, MD.    Vitals:   11/17/17 7035 11/17/17 0554 11/17/17 0600 11/17/17 0700  BP: 133/68  130/65 (!) 113/59  Pulse: 84  79 93  Resp: 18   (!) 26  Temp: 98.7 F (37.1 C)     TempSrc: Oral     SpO2: 94%  94% 98%  Weight:  77.1 kg    Height:  5\' 5"  (1.651 m)       Final Clinical Impressions(s) / ED Diagnoses   Final diagnoses:  Fall, initial encounter  Rib pain on right side  Closed fracture of one rib of right side, initial encounter    ED Discharge Orders         Ordered    lidocaine (LIDODERM) 5 %  Every 24 hours     11/17/17 0739    doxycycline (VIBRAMYCIN) 100 MG capsule  2 times daily     11/17/17 0742           Anselm Pancoast, PA-C 11/17/17 0093    Geoffery Lyons, MD 11/17/17 2300

## 2017-11-29 DIAGNOSIS — S2231XD Fracture of one rib, right side, subsequent encounter for fracture with routine healing: Secondary | ICD-10-CM | POA: Diagnosis not present

## 2017-11-29 DIAGNOSIS — I509 Heart failure, unspecified: Secondary | ICD-10-CM | POA: Diagnosis not present

## 2017-11-29 DIAGNOSIS — Z1339 Encounter for screening examination for other mental health and behavioral disorders: Secondary | ICD-10-CM | POA: Diagnosis not present

## 2017-11-29 DIAGNOSIS — Z9181 History of falling: Secondary | ICD-10-CM | POA: Diagnosis not present

## 2017-11-29 DIAGNOSIS — Z136 Encounter for screening for cardiovascular disorders: Secondary | ICD-10-CM | POA: Diagnosis not present

## 2017-11-29 DIAGNOSIS — F419 Anxiety disorder, unspecified: Secondary | ICD-10-CM | POA: Diagnosis not present

## 2017-11-29 DIAGNOSIS — N189 Chronic kidney disease, unspecified: Secondary | ICD-10-CM | POA: Diagnosis not present

## 2018-02-28 ENCOUNTER — Encounter (HOSPITAL_COMMUNITY): Payer: Self-pay | Admitting: Emergency Medicine

## 2018-02-28 ENCOUNTER — Emergency Department (HOSPITAL_COMMUNITY): Payer: Medicare Other

## 2018-02-28 ENCOUNTER — Inpatient Hospital Stay (HOSPITAL_COMMUNITY)
Admission: EM | Admit: 2018-02-28 | Discharge: 2018-03-06 | DRG: 193 | Disposition: A | Discharge status: Skilled Nursing Facility | Payer: Medicare Other | Source: Ambulatory Visit | Attending: Internal Medicine | Admitting: Internal Medicine

## 2018-02-28 ENCOUNTER — Other Ambulatory Visit: Payer: Self-pay

## 2018-02-28 DIAGNOSIS — J9621 Acute and chronic respiratory failure with hypoxia: Secondary | ICD-10-CM | POA: Diagnosis not present

## 2018-02-28 DIAGNOSIS — J9601 Acute respiratory failure with hypoxia: Secondary | ICD-10-CM | POA: Diagnosis not present

## 2018-02-28 DIAGNOSIS — Z6828 Body mass index (BMI) 28.0-28.9, adult: Secondary | ICD-10-CM

## 2018-02-28 DIAGNOSIS — J189 Pneumonia, unspecified organism: Secondary | ICD-10-CM | POA: Diagnosis not present

## 2018-02-28 DIAGNOSIS — F329 Major depressive disorder, single episode, unspecified: Secondary | ICD-10-CM | POA: Diagnosis present

## 2018-02-28 DIAGNOSIS — E440 Moderate protein-calorie malnutrition: Secondary | ICD-10-CM | POA: Diagnosis not present

## 2018-02-28 DIAGNOSIS — F419 Anxiety disorder, unspecified: Secondary | ICD-10-CM | POA: Diagnosis present

## 2018-02-28 DIAGNOSIS — J181 Lobar pneumonia, unspecified organism: Secondary | ICD-10-CM | POA: Diagnosis not present

## 2018-02-28 DIAGNOSIS — E46 Unspecified protein-calorie malnutrition: Secondary | ICD-10-CM | POA: Diagnosis not present

## 2018-02-28 DIAGNOSIS — H5789 Other specified disorders of eye and adnexa: Secondary | ICD-10-CM | POA: Diagnosis present

## 2018-02-28 DIAGNOSIS — Z823 Family history of stroke: Secondary | ICD-10-CM

## 2018-02-28 DIAGNOSIS — I5032 Chronic diastolic (congestive) heart failure: Secondary | ICD-10-CM | POA: Diagnosis present

## 2018-02-28 DIAGNOSIS — R0600 Dyspnea, unspecified: Secondary | ICD-10-CM | POA: Diagnosis not present

## 2018-02-28 DIAGNOSIS — R0609 Other forms of dyspnea: Secondary | ICD-10-CM

## 2018-02-28 DIAGNOSIS — Z87891 Personal history of nicotine dependence: Secondary | ICD-10-CM

## 2018-02-28 DIAGNOSIS — Z9851 Tubal ligation status: Secondary | ICD-10-CM

## 2018-02-28 DIAGNOSIS — J441 Chronic obstructive pulmonary disease with (acute) exacerbation: Secondary | ICD-10-CM | POA: Diagnosis not present

## 2018-02-28 DIAGNOSIS — Z885 Allergy status to narcotic agent status: Secondary | ICD-10-CM

## 2018-02-28 DIAGNOSIS — I509 Heart failure, unspecified: Secondary | ICD-10-CM

## 2018-02-28 DIAGNOSIS — Z8701 Personal history of pneumonia (recurrent): Secondary | ICD-10-CM

## 2018-02-28 DIAGNOSIS — Z7982 Long term (current) use of aspirin: Secondary | ICD-10-CM

## 2018-02-28 DIAGNOSIS — R05 Cough: Secondary | ICD-10-CM | POA: Diagnosis not present

## 2018-02-28 DIAGNOSIS — R531 Weakness: Secondary | ICD-10-CM | POA: Diagnosis not present

## 2018-02-28 DIAGNOSIS — J440 Chronic obstructive pulmonary disease with acute lower respiratory infection: Secondary | ICD-10-CM | POA: Diagnosis not present

## 2018-02-28 DIAGNOSIS — Z9049 Acquired absence of other specified parts of digestive tract: Secondary | ICD-10-CM

## 2018-02-28 DIAGNOSIS — Z7951 Long term (current) use of inhaled steroids: Secondary | ICD-10-CM

## 2018-02-28 DIAGNOSIS — I130 Hypertensive heart and chronic kidney disease with heart failure and stage 1 through stage 4 chronic kidney disease, or unspecified chronic kidney disease: Secondary | ICD-10-CM | POA: Diagnosis not present

## 2018-02-28 DIAGNOSIS — Z8249 Family history of ischemic heart disease and other diseases of the circulatory system: Secondary | ICD-10-CM

## 2018-02-28 DIAGNOSIS — Z79899 Other long term (current) drug therapy: Secondary | ICD-10-CM

## 2018-02-28 DIAGNOSIS — D631 Anemia in chronic kidney disease: Secondary | ICD-10-CM | POA: Diagnosis present

## 2018-02-28 DIAGNOSIS — Z79891 Long term (current) use of opiate analgesic: Secondary | ICD-10-CM

## 2018-02-28 DIAGNOSIS — K219 Gastro-esophageal reflux disease without esophagitis: Secondary | ICD-10-CM | POA: Diagnosis present

## 2018-02-28 DIAGNOSIS — R0902 Hypoxemia: Secondary | ICD-10-CM | POA: Diagnosis not present

## 2018-02-28 DIAGNOSIS — N182 Chronic kidney disease, stage 2 (mild): Secondary | ICD-10-CM | POA: Diagnosis present

## 2018-02-28 DIAGNOSIS — R0602 Shortness of breath: Secondary | ICD-10-CM | POA: Diagnosis not present

## 2018-02-28 DIAGNOSIS — D696 Thrombocytopenia, unspecified: Secondary | ICD-10-CM | POA: Diagnosis present

## 2018-02-28 DIAGNOSIS — E43 Unspecified severe protein-calorie malnutrition: Secondary | ICD-10-CM | POA: Diagnosis present

## 2018-02-28 DIAGNOSIS — M1711 Unilateral primary osteoarthritis, right knee: Secondary | ICD-10-CM | POA: Diagnosis present

## 2018-02-28 DIAGNOSIS — F322 Major depressive disorder, single episode, severe without psychotic features: Secondary | ICD-10-CM | POA: Diagnosis not present

## 2018-02-28 HISTORY — DX: Pneumonia, unspecified organism: J18.9

## 2018-02-28 HISTORY — DX: Depression, unspecified: F32.A

## 2018-02-28 HISTORY — DX: Gastro-esophageal reflux disease without esophagitis: K21.9

## 2018-02-28 HISTORY — DX: Anemia, unspecified: D64.9

## 2018-02-28 HISTORY — DX: Major depressive disorder, single episode, unspecified: F32.9

## 2018-02-28 HISTORY — DX: Personal history of other diseases of the digestive system: Z87.19

## 2018-02-28 LAB — CBC WITH DIFFERENTIAL/PLATELET
ABS IMMATURE GRANULOCYTES: 0.03 10*3/uL (ref 0.00–0.07)
BASOS ABS: 0.1 10*3/uL (ref 0.0–0.1)
Basophils Relative: 1 %
Eosinophils Absolute: 0 10*3/uL (ref 0.0–0.5)
Eosinophils Relative: 1 %
HCT: 37.4 % (ref 36.0–46.0)
HEMOGLOBIN: 11.2 g/dL — AB (ref 12.0–15.0)
IMMATURE GRANULOCYTES: 1 %
LYMPHS PCT: 17 %
Lymphs Abs: 1 10*3/uL (ref 0.7–4.0)
MCH: 31.4 pg (ref 26.0–34.0)
MCHC: 29.9 g/dL — ABNORMAL LOW (ref 30.0–36.0)
MCV: 104.8 fL — ABNORMAL HIGH (ref 80.0–100.0)
Monocytes Absolute: 0.6 10*3/uL (ref 0.1–1.0)
Monocytes Relative: 9 %
NEUTROS ABS: 4.5 10*3/uL (ref 1.7–7.7)
NEUTROS PCT: 71 %
Platelets: 145 10*3/uL — ABNORMAL LOW (ref 150–400)
RBC: 3.57 MIL/uL — AB (ref 3.87–5.11)
RDW: 13.7 % (ref 11.5–15.5)
WBC: 6.3 10*3/uL (ref 4.0–10.5)
nRBC: 0 % (ref 0.0–0.2)

## 2018-02-28 LAB — BASIC METABOLIC PANEL
ANION GAP: 6 (ref 5–15)
BUN: 13 mg/dL (ref 8–23)
CO2: 28 mmol/L (ref 22–32)
Calcium: 8.1 mg/dL — ABNORMAL LOW (ref 8.9–10.3)
Chloride: 104 mmol/L (ref 98–111)
Creatinine, Ser: 0.95 mg/dL (ref 0.44–1.00)
Glucose, Bld: 85 mg/dL (ref 70–99)
POTASSIUM: 3.9 mmol/L (ref 3.5–5.1)
SODIUM: 138 mmol/L (ref 135–145)

## 2018-02-28 LAB — TROPONIN I

## 2018-02-28 MED ORDER — SODIUM CHLORIDE 0.9 % IV BOLUS
500.0000 mL | Freq: Once | INTRAVENOUS | Status: AC
  Administered 2018-02-28: 500 mL via INTRAVENOUS

## 2018-02-28 MED ORDER — IPRATROPIUM-ALBUTEROL 0.5-2.5 (3) MG/3ML IN SOLN
3.0000 mL | Freq: Once | RESPIRATORY_TRACT | Status: AC
  Administered 2018-02-28: 3 mL via RESPIRATORY_TRACT
  Filled 2018-02-28: qty 3

## 2018-02-28 MED ORDER — AZITHROMYCIN 500 MG PO TABS
500.0000 mg | ORAL_TABLET | ORAL | Status: DC
  Administered 2018-02-28 – 2018-03-06 (×6): 500 mg via ORAL
  Filled 2018-02-28: qty 1
  Filled 2018-02-28: qty 2
  Filled 2018-02-28 (×4): qty 1

## 2018-02-28 MED ORDER — ENSURE ENLIVE PO LIQD
237.0000 mL | Freq: Two times a day (BID) | ORAL | Status: DC
  Administered 2018-03-01 – 2018-03-06 (×10): 237 mL via ORAL
  Filled 2018-02-28 (×2): qty 237

## 2018-02-28 MED ORDER — MOMETASONE FURO-FORMOTEROL FUM 200-5 MCG/ACT IN AERO
2.0000 | INHALATION_SPRAY | Freq: Two times a day (BID) | RESPIRATORY_TRACT | Status: DC
  Administered 2018-02-28 – 2018-03-06 (×9): 2 via RESPIRATORY_TRACT
  Filled 2018-02-28 (×2): qty 8.8

## 2018-02-28 MED ORDER — CLONAZEPAM 1 MG PO TABS
1.0000 mg | ORAL_TABLET | Freq: Two times a day (BID) | ORAL | Status: DC
  Administered 2018-02-28 – 2018-03-06 (×12): 1 mg via ORAL
  Filled 2018-02-28 (×12): qty 1

## 2018-02-28 MED ORDER — METHYLPREDNISOLONE SODIUM SUCC 125 MG IJ SOLR
80.0000 mg | Freq: Once | INTRAMUSCULAR | Status: AC
  Administered 2018-02-28: 80 mg via INTRAVENOUS
  Filled 2018-02-28: qty 2

## 2018-02-28 MED ORDER — FLUOXETINE HCL 20 MG PO CAPS
20.0000 mg | ORAL_CAPSULE | Freq: Every day | ORAL | Status: DC
  Administered 2018-02-28 – 2018-03-06 (×7): 20 mg via ORAL
  Filled 2018-02-28 (×7): qty 1

## 2018-02-28 MED ORDER — PREDNISONE 20 MG PO TABS
40.0000 mg | ORAL_TABLET | Freq: Every day | ORAL | Status: DC
  Administered 2018-03-01 – 2018-03-06 (×6): 40 mg via ORAL
  Filled 2018-02-28 (×6): qty 2

## 2018-02-28 MED ORDER — POTASSIUM CHLORIDE CRYS ER 20 MEQ PO TBCR
20.0000 meq | EXTENDED_RELEASE_TABLET | Freq: Every day | ORAL | Status: DC
  Administered 2018-02-28 – 2018-03-05 (×6): 20 meq via ORAL
  Filled 2018-02-28 (×6): qty 1

## 2018-02-28 MED ORDER — SODIUM CHLORIDE 0.9 % IV SOLN
1.0000 g | INTRAVENOUS | Status: DC
  Administered 2018-02-28 – 2018-03-06 (×6): 1 g via INTRAVENOUS
  Filled 2018-02-28 (×6): qty 10

## 2018-02-28 MED ORDER — SODIUM CHLORIDE 0.9 % IV SOLN
1.0000 g | Freq: Once | INTRAVENOUS | Status: AC
  Administered 2018-02-28: 1 g via INTRAVENOUS
  Filled 2018-02-28: qty 10

## 2018-02-28 MED ORDER — ALBUTEROL SULFATE (2.5 MG/3ML) 0.083% IN NEBU
2.5000 mg | INHALATION_SOLUTION | RESPIRATORY_TRACT | Status: DC | PRN
  Administered 2018-03-03: 2.5 mg via RESPIRATORY_TRACT
  Filled 2018-02-28: qty 3

## 2018-02-28 MED ORDER — ASPIRIN EC 81 MG PO TBEC
81.0000 mg | DELAYED_RELEASE_TABLET | Freq: Every day | ORAL | Status: DC
  Administered 2018-02-28 – 2018-03-06 (×7): 81 mg via ORAL
  Filled 2018-02-28 (×7): qty 1

## 2018-02-28 MED ORDER — ENOXAPARIN SODIUM 40 MG/0.4ML ~~LOC~~ SOLN
40.0000 mg | SUBCUTANEOUS | Status: DC
  Administered 2018-02-28 – 2018-03-05 (×6): 40 mg via SUBCUTANEOUS
  Filled 2018-02-28 (×6): qty 0.4

## 2018-02-28 MED ORDER — SODIUM CHLORIDE 0.9 % IV SOLN
500.0000 mg | Freq: Once | INTRAVENOUS | Status: AC
  Administered 2018-02-28: 500 mg via INTRAVENOUS
  Filled 2018-02-28: qty 500

## 2018-02-28 NOTE — ED Notes (Signed)
Pt transported to XR.  

## 2018-02-28 NOTE — H&P (Addendum)
History and Physical  Andrienne Havener ZOX:[id number] DOB: [birth year] DOA: 02/28/2018  Referring physician: Dr Adriana Simas, ED physician PCP: Ronal Fear, NP  Outpatient Specialists:   Patient Coming From: home  Chief Complaint: dyspnea  HPI: [name] [name] is a 67 y.o. female with a history of COPD, 1 diastolic CHF, anxiety.  Patient seen for shortness of breath that is been worsening over the past week.  She is only able to ambulate a few feet before becoming short of breath and needing to sit and rest.  Her symptoms are worsening.  No apparent palliating or provoking factors.  She has a nonproductive cough with wheezing.  She has been on her inhaler, which has not helped.  Denies fevers, chills, nausea, vomiting, hematemesis or hemoptysis.  Emergency Department Course: Received Solu-Medrol.  Chest x-ray shows pneumonia.  White count normal.  No fever no fever.  Patient is quite tachypneic and remains so after nebulizer treatment  Review of Systems:   Pt denies any fevers, chills, nausea, vomiting, diarrhea, constipation, abdominal pain, orthopnea, cough, wheezing, palpitations, headache, vision changes, lightheadedness, dizziness, melena, rectal bleeding.  Review of systems are otherwise negative  Past Medical History:  Diagnosis Date  . Acute renal failure (HCC)   . Angina   . Anxiety   . Arthritis   . CHF (congestive heart failure) (HCC)   . COPD (chronic obstructive pulmonary disease) (HCC)   . Degenerative disorder of eye    Right eye  . Dizziness   . Fluid retention   . Hypertension   . Shortness of breath    Past Surgical History:  Procedure Laterality Date  . CHOLECYSTECTOMY    . KNEE SURGERY     Social History:  reports that she quit smoking about 16 years ago. She quit after 30.00 years of use. Her smokeless tobacco use includes snuff. She reports that she does not drink alcohol or use drugs. Patient lives at home  Allergies  Allergen Reactions  . Codeine Other (See  Comments)    Feel Numb.     Family History  Problem Relation Age of Onset  . Heart failure Mother   . Stroke Father       Prior to Admission medications   Medication Sig Start Date End Date Taking? Authorizing Provider  albuterol (PROVENTIL HFA;VENTOLIN HFA) 108 (90 BASE) MCG/ACT inhaler Inhale 2 puffs into the lungs every 6 (six) hours as needed. For shortness of breath   Yes [provider]  aspirin 81 MG tablet Take 81 mg by mouth daily.   Yes [provider]  calcium-vitamin D (OSCAL) 250-125 MG-UNIT per tablet Take 1 tablet by mouth daily. 11/07/11 02/28/18 Yes Larey Seat, MD  cholecalciferol (VITAMIN D) 1000 units tablet Take 1,000 Units by mouth daily.   Yes [provider]  clonazePAM (KLONOPIN) 1 MG tablet Take 1 mg by mouth 2 (two) times daily.   Yes [provider]  Coenzyme Q10 (COQ10) 200 MG CAPS Take 1 capsule by mouth daily.   Yes [provider]  FLUoxetine (PROZAC) 20 MG capsule Take 20 mg by mouth daily.   Yes [provider]  furosemide (LASIX) 40 MG tablet Take 40 mg by mouth daily as needed for fluid.    Yes [provider]  Iron-FA-B Cmp-C-Biot-Probiotic (FUSION PLUS PO) Take 1 capsule by mouth daily.   Yes [provider]  Multiple Vitamin (MULTIVITAMIN WITH MINERALS) TABS tablet Take 1 tablet by mouth daily.   Yes [provider]  potassium chloride SA (K-DUR,KLOR-CON) 20 MEQ tablet Take 20 mEq by mouth daily.    Yes [provider]  Fluticasone-Salmeterol (ADVAIR) 250-50 MCG/DOSE AEPB Inhale 1 puff into the lungs 2 (two) times daily. Patient not taking: Reported on 05/07/2017 11/07/11   Larey SeatKesty, Jenna M, MD  lidocaine (LIDODERM) 5 % Place 1 patch onto the skin daily. Remove & Discard patch within 12 hours or as directed by MD Patient not taking: Reported on 02/28/2018 11/17/17   Anselm PancoastJoy, Shawn C, PA-C    Physical Exam: BP 104/79 (BP Location: Right Arm)   Pulse 81   Temp 98.6 F  (37 C) (Oral)   Resp (!) 32   Ht 5\' 5"  (1.651 m)   Wt 77.1 kg   SpO2 99%   BMI 28.29 kg/m   . General: Elderly Caucasian female. Awake and alert and oriented x3. No acute cardiopulmonary distress.  Marland Kitchen. HEENT: Normocephalic atraumatic.  Right and left ears normal in appearance.  Pupils equal, round, reactive to light. Extraocular muscles are intact. Sclerae anicteric and noninjected.  Moist mucosal membranes. No mucosal lesions.  . Neck: Neck supple without lymphadenopathy. No carotid bruits. No masses palpated.  . Cardiovascular: Regular rate with normal S1-S2 sounds. No murmurs, rubs, gallops auscultated. No JVD.  Marland Kitchen. Respiratory: Tachypneic.  Becomes mildly dyspneic with talking.  Coarse respiratory sounds with coarse expiratory wheezing.  No accessory muscle use. . Abdomen: Soft, nontender, nondistended. Active bowel sounds. No masses or hepatosplenomegaly  . Skin: No rashes, lesions, or ulcerations.  Dry, warm to touch. 2+ dorsalis pedis and radial pulses. . Musculoskeletal: No calf or leg pain. All major joints not erythematous nontender.  No upper or lower joint deformation.  Good ROM.  No contractures  . Psychiatric: Intact judgment and insight. Pleasant and cooperative. . Neurologic: No focal neurological deficits. Strength is 5/5 and symmetric in upper and lower extremities.  Cranial nerves II through XII are grossly intact.           Labs on Admission: I have personally reviewed following labs and imaging studies  CBC: Recent Labs  Lab 02/28/18 1344  WBC 6.3  NEUTROABS 4.5  HGB 11.2*  HCT 37.4  MCV 104.8*  PLT 145*   Basic Metabolic Panel: Recent Labs  Lab 02/28/18 1344  NA 138  K 3.9  CL 104  CO2 28  GLUCOSE 85  BUN 13  CREATININE 0.95  CALCIUM 8.1*   GFR: Estimated Creatinine Clearance: 57.4 mL/min (by C-G formula based on SCr of 0.95 mg/dL). Liver Function Tests: No results for input(s): AST, ALT, ALKPHOS, BILITOT, PROT, ALBUMIN in the last 168 hours. No  results for input(s): LIPASE, AMYLASE in the last 168 hours. No results for input(s): AMMONIA in the last 168 hours. Coagulation Profile: No results for input(s): INR, PROTIME in the last 168 hours. Cardiac Enzymes: Recent Labs  Lab 02/28/18 1344  TROPONINI <0.03   BNP (last 3 results) No results for input(s): PROBNP in the last 8760 hours. HbA1C: No results for input(s): HGBA1C in the last 72 hours. CBG: No results for input(s): GLUCAP in the last 168 hours. Lipid Profile: No results for input(s): CHOL, HDL, LDLCALC, TRIG, CHOLHDL, LDLDIRECT in the last 72 hours. Thyroid Function Tests: No results for input(s): TSH, T4TOTAL, FREET4, T3FREE, THYROIDAB in the last 72 hours. Anemia Panel: No results for input(s): VITAMINB12, FOLATE, FERRITIN, TIBC, IRON, RETICCTPCT in the last 72 hours. Urine analysis:    Component Value Date/Time   COLORURINE YELLOW 11/06/2011 0223  APPEARANCEUR CLEAR 11/06/2011 0223   LABSPEC 1.014 11/06/2011 0223   PHURINE 5.0 11/06/2011 0223   GLUCOSEU NEGATIVE 11/06/2011 0223   HGBUR NEGATIVE 11/06/2011 0223   BILIRUBINUR NEGATIVE 11/06/2011 0223   KETONESUR NEGATIVE 11/06/2011 0223   PROTEINUR NEGATIVE 11/06/2011 0223   UROBILINOGEN 0.2 11/06/2011 0223   NITRITE NEGATIVE 11/06/2011 0223   LEUKOCYTESUR MODERATE (A) 11/06/2011 0223   Sepsis Labs: @LABRCNTIP (procalcitonin:4,lacticidven:4) )No results found for this or any previous visit (from the past 240 hour(s)).   Radiological Exams on Admission: Dg Chest 2 View  Result Date: 02/28/2018 CLINICAL DATA:  Cough, pneumonia, shortness of breath for 6 weeks, congestion, weakness, nausea and vomiting after eating, history CHF, COPD, hypertension EXAM: CHEST - 2 VIEW COMPARISON:  11/17/2017 FINDINGS: Minimal enlargement of cardiac silhouette. Atherosclerotic calcifications aorta. Diffuse chronic interstitial lung disease changes progressive since 2016. Question superimposed LEFT upper lobe infiltrate versus  worsening of chronic interstitial disease. Pleural thickening versus small effusion lower RIGHT chest. No pneumothorax or acute osseous findings. IMPRESSION: Chronic interstitial lung disease changes/fibrosis with question superimposed acute LEFT upper lobe infiltrate. Electronically Signed   By: Ulyses Southward M.D.   On: 02/28/2018 12:59    EKG: Independently reviewed.  Sinus rhythm  Assessment/Plan: Principal Problem:   CAP (community acquired pneumonia) Active Problems:   Dyspnea   Anxiety   COPD with acute exacerbation (HCC)   CHF (congestive heart failure) (HCC)    This patient was discussed with the ED physician, including pertinent vitals, physical exam findings, labs, and imaging.  We also discussed care given by the ED provider.  1. Community-acquired pneumonia Observation due to continued dyspnea and tachypnea.  Patient may be able to return home tomorrow following antibiotics and steroids and nebulizer treatments Antibiotics: Ceftin and azithromycin Robitussin Blood cultures drawn in the emergency department Sputum cultures CBC tomorrow Strep antigen by urine 2. COPD with acute exacerbation a. Continue steroids and nebulizer treatments 3. Dyspnea a. Improved, although continues to be dyspneic with talking and exertion.  Patient also has tachypnea 4. Anxiety a. Continue home regimen 5. Diastolic CHF a. Compensated   DVT prophylaxis: Lovenox Consultants: None Code Status: Full code Family Communication: None Disposition Plan: To return home following improvement of respiratory status   Noralee Chars [facility] Pager [phone]  If 7PM-7AM, please contact night-coverage [email or web address] Password [email or web address]

## 2018-02-28 NOTE — ED Triage Notes (Signed)
Pt with [facility] Cough x 6 weeks, decreased appetite. Family Dr. Carlyon ProwsBelieves she may have pneumonia.Crackles in the lower lobes. Wearing 3L O2. 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

## 2018-02-28 NOTE — ED Provider Notes (Addendum)
[facility] Note   CSN: [id number] Arrival date & time: 02/28/18  1053     History   Chief Complaint Chief Complaint  Patient presents with  . Pneumonia  . Cough    HPI Jesse FallJudy Fedder is a 67 y.o. female.  Cough and general malaise for several weeks.  Patient was seen by her primary care doctor today and sent to the emergency department.  She is concerned about pneumonia.  She quit smoking in 2002.  She is coughing and dyspneic.  No fever, sweats, chills, rusty sputum, sternal chest pain.  Severity is moderate.  Exertion makes symptoms worse.  Patient has a history of respiratory failure.     Past Medical History:  Diagnosis Date  . Acute renal failure (HCC)   . Angina   . Anxiety   . Arthritis   . CHF (congestive heart failure) (HCC)   . COPD (chronic obstructive pulmonary disease) (HCC)   . Degenerative disorder of eye    Right eye  . Dizziness   . Fluid retention   . Hypertension   . Shortness of breath     Patient Active Problem List   Diagnosis Date Noted  . Acute on chronic renal failure (HCC) 11/05/2011  . Generalized weakness 11/05/2011  . Anemia 11/05/2011  . COPD (chronic obstructive pulmonary disease) (HCC) 11/05/2011  . Dysphagia 08/20/2011  . Purulent bronchitis (HCC) 08/20/2011  . Acute respiratory failure with hypoxia (HCC) 08/17/2011  . Pulmonary infiltrates 08/17/2011  . Abnormal CT scan, chest 08/17/2011  . Acute renal failure (HCC) 08/17/2011    Past Surgical History:  Procedure Laterality Date  . CHOLECYSTECTOMY    . KNEE SURGERY       OB History   No obstetric history on file.      Home Medications    Prior to Admission medications   Medication Sig Start Date End Date Taking? Authorizing Provider  Aclidinium Bromide (TUDORZA PRESSAIR) 400 MCG/ACT AEPB Inhale 1 puff into the lungs 2 (two) times daily as needed. For shortness of breath   Yes [provider]  albuterol  (PROVENTIL HFA;VENTOLIN HFA) 108 (90 BASE) MCG/ACT inhaler Inhale 2 puffs into the lungs every 6 (six) hours as needed. For shortness of breath   Yes [provider]  aspirin 81 MG tablet Take 81 mg by mouth daily.   Yes [provider]  cholecalciferol (VITAMIN D) 1000 units tablet Take 1,000 Units by mouth daily.   Yes [provider]  clonazePAM (KLONOPIN) 1 MG tablet Take 1 mg by mouth 2 (two) times daily.   Yes [provider]  Coenzyme Q10 (COQ10) 200 MG CAPS Take 1 capsule by mouth daily.   Yes [provider]  FLUoxetine (PROZAC) 20 MG capsule Take 20 mg by mouth daily.   Yes [provider]  furosemide (LASIX) 40 MG tablet Take 40 mg by mouth daily as needed for fluid.    Yes [provider]  HYDROcodone-acetaminophen (NORCO) 10-325 MG per tablet Take 1 tablet by mouth every 6 (six) hours as needed. For pain   Yes [provider]  Iron-FA-B Cmp-C-Biot-Probiotic (FUSION PLUS PO) Take 1 capsule by mouth daily.   Yes [provider]  lidocaine (LIDODERM) 5 % Place 1 patch onto the skin daily. Remove & Discard patch within 12 hours or as directed by MD 11/17/17  Yes Joy, Shawn C, PA-C  potassium chloride SA (K-DUR,KLOR-CON) 20 MEQ tablet Take 20 mEq by mouth  daily.    Yes [provider]  promethazine (PHENERGAN) 25 MG tablet Take 25 mg by mouth every 6 (six) hours as needed. For nausea   Yes [provider]  calcium-vitamin D (OSCAL) 250-125 MG-UNIT per tablet Take 1 tablet by mouth daily. Patient not taking: Reported on 11/17/2017 11/07/11 11/06/12  Larey Seat, MD  Dentifrices (BIOTENE DRY MOUTH) GEL Place 1 application onto teeth as needed. Patient not taking: Reported on 05/07/2017 11/07/11   Larey Seat, MD  Fluticasone-Salmeterol (ADVAIR) 250-50 MCG/DOSE AEPB Inhale 1 puff into the lungs 2 (two) times daily. Patient not taking: Reported on 05/07/2017 11/07/11   Larey Seat, MD    Family  History Family History  Problem Relation Age of Onset  . Heart failure Mother   . Stroke Father     Social History Social History   Tobacco Use  . Smoking status: Former Smoker    Years: 30.00    Last attempt to quit: 03/19/2001    Years since quitting: 16.9  . Smokeless tobacco: Current User    Types: Snuff  Substance Use Topics  . Alcohol use: No  . Drug use: No     Allergies   Codeine   Review of Systems Review of Systems  All other systems reviewed and are negative.    Physical Exam Updated Vital Signs BP 104/79 (BP Location: Right Arm)   Pulse 81   Temp 98.6 F (37 C) (Oral)   Resp (!) 32   Ht 5\' 5"  (1.651 m)   Wt 77.1 kg   SpO2 99%   BMI 28.29 kg/m   Physical Exam Vitals signs and nursing note reviewed.  Constitutional:      Appearance: She is well-developed.     Comments: Slightly dyspneic and tachypneic  HENT:     Head: Normocephalic and atraumatic.  Eyes:     Conjunctiva/sclera: Conjunctivae normal.  Neck:     Musculoskeletal: Neck supple.  Cardiovascular:     Rate and Rhythm: Normal rate and regular rhythm.  Pulmonary:     Comments: Scattered rhonchi. Abdominal:     General: Bowel sounds are normal.     Palpations: Abdomen is soft.  Musculoskeletal: Normal range of motion.  Skin:    General: Skin is warm and dry.  Neurological:     Mental Status: She is alert and oriented to person, place, and time.  Psychiatric:        Behavior: Behavior normal.      ED Treatments / Results  Labs (all labs ordered are listed, but only abnormal results are displayed) Labs Reviewed  CBC WITH DIFFERENTIAL/PLATELET - Abnormal; Notable for the following components:      Result Value   RBC 3.57 (*)    Hemoglobin 11.2 (*)    MCV 104.8 (*)    MCHC 29.9 (*)    Platelets 145 (*)    All other components within normal limits  BASIC METABOLIC PANEL - Abnormal; Notable for the following components:   Calcium 8.1 (*)    All other components within  normal limits  TROPONIN I    EKG EKG Interpretation  Date/Time:  Friday February 28 2018 12:30:16 EST Ventricular Rate:  74 PR Interval:    QRS Duration: 97 QT Interval:  424 QTC Calculation: 471 R Axis:   65 Text Interpretation:  Sinus rhythm Confirmed by Donnetta Hutching ([id number]) on 02/28/2018 3:06:23 PM   Radiology Dg Chest 2 View  Result Date: 02/28/2018 CLINICAL DATA:  Cough, pneumonia, shortness of breath for 6 weeks, congestion, weakness, nausea and vomiting after eating, history CHF, COPD, hypertension EXAM: CHEST - 2 VIEW COMPARISON:  11/17/2017 FINDINGS: Minimal enlargement of cardiac silhouette. Atherosclerotic calcifications aorta. Diffuse chronic interstitial lung disease changes progressive since 2016. Question superimposed LEFT upper lobe infiltrate versus worsening of chronic interstitial disease. Pleural thickening versus small effusion lower RIGHT chest. No pneumothorax or acute osseous findings. IMPRESSION: Chronic interstitial lung disease changes/fibrosis with question superimposed acute LEFT upper lobe infiltrate. Electronically Signed   By: Ulyses Southward M.D.   On: 02/28/2018 12:59    Procedures Procedures (including critical care time)  Medications Ordered in ED Medications  azithromycin (ZITHROMAX) 500 mg in sodium chloride 0.9 % 250 mL IVPB (has no administration in time range)  sodium chloride 0.9 % bolus 500 mL (500 mLs Intravenous New Bag/Given 02/28/18 1420)  ipratropium-albuterol (DUONEB) 0.5-2.5 (3) MG/3ML nebulizer solution 3 mL (3 mLs Nebulization Given 02/28/18 1421)  methylPREDNISolone sodium succinate (SOLU-MEDROL) 125 mg/2 mL injection 80 mg (80 mg Intravenous Given 02/28/18 1419)  cefTRIAXone (ROCEPHIN) 1 g in sodium chloride 0.9 % 100 mL IVPB (0 g Intravenous Stopped 02/28/18 1536)     Initial Impression / Assessment and Plan / ED Course  I have reviewed the triage vital signs and the nursing notes.  Pertinent labs & imaging results that were  available during my care of the patient were reviewed by me and considered in my medical decision making (see chart for details).     Patient presents with persistent cough and dyspnea.  This x-ray reveals a left upper lobe pneumonia.  She has COPD.  Rx for community-acquired pneumonia, IV steroids, nebulizer treatment, admit.  Disc c Dr Adrian Blackwater and Dr Madilyn Hook   CRITICAL CARE Performed by: Donnetta Hutching Total critical care time: 30 minutes Critical care time was exclusive of separately billable procedures and treating other patients. Critical care was necessary to treat or prevent imminent or life-threatening deterioration. Critical care was time spent personally by me on the following activities: development of treatment plan with patient and/or surrogate as well as nursing, discussions with consultants, evaluation of patient's response to treatment, examination of patient, obtaining history from patient or surrogate, ordering and performing treatments and interventions, ordering and review of laboratory studies, ordering and review of radiographic studies, pulse oximetry and re-evaluation of patient's condition.  Final Clinical Impressions(s) / ED Diagnoses   Final diagnoses:  Community acquired pneumonia of left lung, unspecified part of lung    ED Discharge Orders    None       Donnetta Hutching, MD 02/28/18 1527    Donnetta Hutching, MD 02/28/18 [id number]

## 2018-03-01 DIAGNOSIS — J181 Lobar pneumonia, unspecified organism: Secondary | ICD-10-CM | POA: Diagnosis not present

## 2018-03-01 LAB — BASIC METABOLIC PANEL
Anion gap: 4 — ABNORMAL LOW (ref 5–15)
BUN: 18 mg/dL (ref 8–23)
CO2: 27 mmol/L (ref 22–32)
Calcium: 7.8 mg/dL — ABNORMAL LOW (ref 8.9–10.3)
Chloride: 106 mmol/L (ref 98–111)
Creatinine, Ser: 1 mg/dL (ref 0.44–1.00)
GFR calc Af Amer: >60 mL/min (ref >60)
GFR, EST NON AFRICAN AMERICAN: 57 mL/min — AB (ref >60)
Glucose, Bld: 219 mg/dL — ABNORMAL HIGH (ref 70–99)
Potassium: 4.4 mmol/L (ref 3.5–5.1)
Sodium: 137 mmol/L (ref 135–145)

## 2018-03-01 LAB — CBC
HCT: 29.5 % — ABNORMAL LOW (ref 36.0–46.0)
Hemoglobin: 9.1 g/dL — ABNORMAL LOW (ref 12.0–15.0)
MCH: 32.2 pg (ref 26.0–34.0)
MCHC: 30.8 g/dL (ref 30.0–36.0)
MCV: 104.2 fL — ABNORMAL HIGH (ref 80.0–100.0)
Platelets: 116 10*3/uL — ABNORMAL LOW (ref 150–400)
RBC: 2.83 MIL/uL — ABNORMAL LOW (ref 3.87–5.11)
RDW: 13.7 % (ref 11.5–15.5)
WBC: 3.6 10*3/uL — ABNORMAL LOW (ref 4.0–10.5)
nRBC: 0 % (ref 0.0–0.2)

## 2018-03-01 LAB — HIV ANTIBODY (ROUTINE TESTING W REFLEX): HIV Screen 4th Generation wRfx: NONREACTIVE

## 2018-03-01 NOTE — Evaluation (Signed)
Physical Therapy Evaluation Patient Details Name: [name] [name] MRN: [id number] DOB: 08-03-1950 Today's Date: 03/01/2018   History of Present Illness  Pt is a 67 y/o female who presents with SOB and decreasing tolerance for functional activity. X-ray revealed PNA. PMH significant for HTN, hiatal hernia, dizziness, depression, degenerative disorder of R eye, COPD, CHF, acute renal failure.   Clinical Impression  Pt admitted with above diagnosis. Pt currently with functional limitations due to the deficits listed below (see PT Problem List). At the time of PT eval pt was able to perform transfers and very limited EOB ambulation with heavy min assist with RW, to mod assist without RW. Pt reports a significant decrease in function at home, due to weakness, DOE, and low vision. Pt reports she has limited help at home from her son, and often does not eat meals as she cannot cook for herself. Recommending continued PT services at the SNF level at d/c to maximize functional independence and safety prior to return home. Acutely, pt will benefit from skilled PT to increase their independence and safety with mobility to allow discharge to the venue listed below.       Of note: At rest, O2 sats remained in low to mid 90's on RA. With standing >3 minutes and side steps at EOB, sats dropped to 85% on RA. Pt was left with 1L/min supplemental O2 and sats at 95%.    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 Pt reporting she can see shadows and was able to point out objects ~8 feet in front of her in room.  Restrictions Weight Bearing Restrictions: No      Mobility  Bed Mobility Overal bed mobility: Needs Assistance Bed Mobility: Supine to  Sit;Sit to Supine     Supine to sit: Min assist Sit to supine: Min guard   General bed mobility comments: VC's for sequencing and safety. Pt was able to transition to EOB with hands-on assist for posterior lean.   Transfers Overall transfer level: Needs assistance Equipment used: Rolling walker (2 wheeled);1 person hand held assist Transfers: Sit to/from Stand Sit to Stand: Min assist;Mod assist         General transfer comment: Min assist for power-up to full stand. Pt required mod assist for balance support without RW. With BUE support on walker, less assist for support once standing.   Ambulation/Gait Ambulation/Gait assistance: Min guard Gait Distance (Feet): 2 Feet Assistive device: Rolling walker (2 wheeled) Gait Pattern/deviations: Step-to pattern;Decreased stride length;Trunk flexed Gait velocity: Decreased Gait velocity interpretation: <1.31 ft/sec, indicative of household ambulator General Gait Details: Pt was able to sidestep towards [facility] with RW for support. Hands-on guarding provided throughout for safety.   Stairs            Wheelchair Mobility    Modified Rankin (Stroke Patients Only)       Balance Overall balance assessment: Needs assistance Sitting-balance support: Feet supported;No upper extremity supported Sitting balance-Leahy Scale: Poor Sitting balance - Comments: Assist required to correct posterior lean Postural control: Posterior lean Standing balance support: Bilateral upper extremity supported Standing balance-Leahy Scale: Poor Standing balance comment: BUE support and assist from PT required.  Pertinent Vitals/Pain Pain Assessment: No/denies pain    Home Living Family/patient expects to be discharged to:: Private residence Living Arrangements: Children Available Help at Discharge: Family;Available PRN/intermittently Type of Home: Mobile home Home Access: Stairs to enter Entrance  Stairs-Rails: Right;Left;Can reach both Entrance Stairs-Number of Steps: 5 Home Layout: One level Home Equipment: Walker - 2 wheels;Cane - single point;Walker - 4 wheels(Lift chair) Additional Comments: Son works mostly at night and sleeps during the day.     Prior Function Level of Independence: Needs assistance   Gait / Transfers Assistance Needed: Uses wall mostly for support - has a walker but it is difficult to maneuver the walker around her mobile home  ADL's / Homemaking Assistance Needed: Son provides set up for dressing but pt states she is able to dress herself. Son leaves her washrags in the bathroom and she sits on the commode to clean up. Has not been in the shower in years per pt.   Comments: Wears pull up briefs at home.      Hand Dominance   Dominant Hand: Right    Extremity/Trunk Assessment   Upper Extremity Assessment Upper Extremity Assessment: Generalized weakness    Lower Extremity Assessment Lower Extremity Assessment: Generalized weakness    Cervical / Trunk Assessment Cervical / Trunk Assessment: Kyphotic;Other exceptions Cervical / Trunk Exceptions: Forward head posture with rounded shoulders  Communication   Communication: HOH  Cognition Arousal/Alertness: Awake/alert Behavior During Therapy: Anxious Overall Cognitive Status: Impaired/Different from baseline Area of Impairment: Attention;Following commands;Memory;Safety/judgement;Awareness;Problem solving                   Current Attention Level: Sustained Memory: Decreased short-term memory Following Commands: Follows one step commands consistently;Follows one step commands with increased time;Follows multi-step commands inconsistently Safety/Judgement: Decreased awareness of safety Awareness: Intellectual Problem Solving: Slow processing;Decreased initiation;Difficulty sequencing;Requires verbal cues General Comments: Pt not answering orientation questions, and perseverating on home  situation with son. Often becoming emotional and frequently bringing up husband's recent death in June (they had been married for 48 years).       General Comments      Exercises     Assessment/Plan    PT Assessment Patient needs continued PT services  PT Problem List Decreased strength;Decreased range of motion;Decreased activity tolerance;Decreased balance;Decreased mobility;Decreased knowledge of use of DME;Decreased safety awareness;Decreased knowledge of precautions;Cardiopulmonary status limiting activity;Decreased cognition       PT Treatment Interventions Gait training;DME instruction;Stair training;Functional mobility training;Therapeutic activities;Therapeutic exercise;Neuromuscular re-education;Patient/family education    PT Goals (Current goals can be found in the Care Plan section)  Acute Rehab PT Goals Patient Stated Goal: Be able to get up and get herself something to eat at home.  PT Goal Formulation: With patient Time For Goal Achievement: 03/15/18 Potential to Achieve Goals: Fair    Frequency Min 2X/week   Barriers to discharge Decreased caregiver support Pt reports she is afraid of her son and would rather go to SNF at d/c.     Co-evaluation               AM-PAC PT "6 Clicks" Mobility  Outcome Measure Help needed turning from your back to your side while in a flat bed without using bedrails?: A Little Help needed moving from lying on your back to sitting on the side of a flat bed without using bedrails?: A Little Help needed moving to and from a bed to a chair (including a wheelchair)?: A Little Help needed standing up from a chair using  your arms (e.g., wheelchair or bedside chair)?: A Little Help needed to walk in hospital room?: A Lot Help needed climbing 3-5 steps with a railing? : Total 6 Click Score: 15    End of Session Equipment Utilized During Treatment: Gait belt Activity Tolerance: Patient limited by fatigue Patient left: in bed;with  call bell/phone within reach;with bed alarm set Nurse Communication: Mobility status(4 bedrails up due to low vision. Pt agreeable.) PT Visit Diagnosis: History of falling (Z91.81);Muscle weakness (generalized) (M62.81);Difficulty in walking, not elsewhere classified (R26.2)    Time: 8469-6295 PT Time Calculation (min) (ACUTE ONLY): 32 min   Charges:   PT Evaluation $PT Eval Moderate Complexity: 1 Mod PT Treatments $Gait Training: 8-22 mins        Conni Slipper, PT, DPT Acute Rehabilitation Services Pager: [phone] Office: [phone]   Marylynn Pearson 03/01/2018, 11:35 AM

## 2018-03-01 NOTE — Clinical Social Work Note (Signed)
Clinical Social Work Assessment  Patient Details  Name: [name] [name] MRN: [id number] Date of Birth: 1950/06/24  Date of referral:  03/01/18               Reason for consult:  Facility Placement                Permission sought to share information with:  Oceanographeracility Contact Representative Permission granted to share information::  Yes, Verbal Permission Granted  Name::        Agency::     Relationship::     Contact Information:     Housing/Transportation Living arrangements for the past 2 months:  Single Family Home Source of Information:  Patient Patient Interpreter Needed:  None Criminal Activity/Legal Involvement Pertinent to Current Situation/Hospitalization:  No - Comment as needed Significant Relationships:  Adult Children, Other(Comment)(Supportive sister-in-law) Lives with:  Adult Children Do you feel safe going back to the place where you live?  Yes Need for family participation in patient care:     Care giving concerns:  Patient with adult son that she says is not responsible. Patient reports she is worried about her home with him alone.   Social Worker assessment / plan:  CSW will complete FL-2 and fax request to SNF facilities in [place]. CSW notes patient identifies having a sister-in-law in the area and wants to be close to her.  Employment status:  Retired Health and safety inspectornsurance information:  Medicare PT Recommendations:  Skilled Holiday representativeursing Facility, 24 Hour Supervision Information / Referral to community resources:     Patient/Family's Response to care:  CSW notes no current concerns or reservations from patient.  Patient/Family's Understanding of and Emotional Response to Diagnosis, Current Treatment, and Prognosis:  Patient is open to SNF. Patient has concerns about safety of her home with her son there alone, otherwise patient seems open to SNF.   Emotional Assessment Appearance:  Appears older than stated age Attitude/Demeanor/Rapport:  Charismatic,  Self-Confident Affect (typically observed):  Accepting, Calm, Pleasant Orientation:  Oriented to Self, Oriented to  Time, Oriented to Place, Oriented to Situation Alcohol / Substance use:  Not Applicable Psych involvement (Current and /or in the community):  No (Comment)  Discharge Needs  Concerns to be addressed:  Discharge Planning Concerns Readmission within the last 30 days:  No Current discharge risk:  None Barriers to Discharge:  No Barriers Identified   Annalee GentaJoey R Chellsie Gomer, LCSW 03/01/2018, 1:50 PM

## 2018-03-01 NOTE — Progress Notes (Signed)
SATURATION QUALIFICATIONS: (This note is used to comply with regulatory documentation for home oxygen)  Patient Saturations on Room Air at Rest = 93%  Patient Saturations on Room Air while Ambulating = 85%  Patient Saturations on 1 Liters of oxygen at end of session at rest = 95%  Please briefly explain why patient needs home oxygen: Pt is unable to maintain O2 sats >85% on RA during functional mobility.  Please see PT evaluation for full report.  Conni SlipperLaura Quincy Boy, PT, DPT Acute Rehabilitation Services Pager: [phone] Office: [phone]

## 2018-03-01 NOTE — Progress Notes (Signed)
PROGRESS NOTE    [name] [name]  ZOX:[id number] DOB: 1950-09-07 DOA: 02/28/2018 PCP: Ronal Fear, NP   Brief Narrative:  [name] [name] is a 67 y.o. female with a history of COPD, 1 diastolic CHF, anxiety.  Patient seen for shortness of breath that is been worsening over the past week.  She is only able to ambulate a few feet before becoming short of breath and needing to sit and rest.  Her symptoms are worsening.  No apparent palliating or provoking factors.  She has a nonproductive cough with wheezing.  She has been on her inhaler, which has not helped.  Denies fevers, chills, nausea, vomiting, hematemesis or hemoptysis.  Emergency Department Course: Received Solu-Medrol.  Chest x-ray shows pneumonia.  White count normal.  No fever no fever.  Patient is quite tachypneic and remains so after nebulizer treatment.  Observation course:  Remains very weak.  Has no heat at home  Seems food insecure and possibly even fearful.   Assessment & Plan:   Principal Problem:   CAP (community acquired pneumonia) Active Problems:   Dyspnea   Anxiety   COPD with acute exacerbation (HCC)   CHF (congestive heart failure) (HCC)    1. Community-acquired pneumonia a. Observation due to continued dyspnea and tachypnea.  Patient is unable to return home due to safety concerns.  Will contact [facility] for assistance b. Antibiotics: Ceftin and azithromycin c. Robitussin d. Blood cultures drawn in the emergency department e. Sputum cultures f. CBC ok g. Strep antigen by urine 2. COPD with acute exacerbation a. Continue steroids and nebulizer treatments 3. Dyspnea a. Improved, although continues to be dyspneic with talking and exertion.  Patient also has tachypnea 4. Anxiety a. Continue home regimen 5. Diastolic CHF a. Compensated         6.  Malnutrition?  Seems to have sunken eyes and will check prealbumenm DVT prophylaxis: Lovenox Consultants: None Code Status: Full code Family Communication:  None Disposition Plan: To return home following improvement of respiratory status   Subjective: weak ill appearing  Objective: Vitals:   02/28/18 2345 03/01/18 0641 03/01/18 1100 03/01/18 1325  BP: (!) 91/46 (!) 98/51 (!) 104/50 (!) 107/54  Pulse: 71 (!) 51 (!) 55 74  Resp: 14 16 18 18   Temp: 98.1 F (36.7 C) (!) 97.5 F (36.4 C)  98 F (36.7 C)  TempSrc: Oral Oral    SpO2: 100% 99% 99% 99%  Weight:      Height:        Intake/Output Summary (Last 24 hours) at 03/01/2018 1632 Last data filed at 03/01/2018 0100 Gross per 24 hour  Intake 821.82 ml  Output -  Net 821.82 ml   Filed Weights   02/28/18 1130  Weight: 77.1 kg    Examination:  General exam: Appears calm and comfortable  Respiratory system: Clear to auscultation. Respiratory effort normal. Cardiovascular system: S1 & S2 heard, RRR. No JVD, murmurs, rubs, gallops or clicks. No pedal edema. Gastrointestinal system: Abdomen is nondistended, soft and nontender. No organomegaly or masses felt. Normal bowel sounds heard. Central nervous system: Alert and oriented. No focal neurological deficits. Extremities: Symmetric 5 x 5 power. Skin: No rashes, lesions or ulcers, sunken eyes and temporal wasting Psychiatry: Judgement and insight appear normal. Mood & affect appropriate.     Data Reviewed: I have personally reviewed following labs and imaging studies  CBC: Recent Labs  Lab 02/28/18 1344 03/01/18 0533  WBC 6.3 3.6*  NEUTROABS 4.5  --   HGB  11.2* 9.1*  HCT 37.4 29.5*  MCV 104.8* 104.2*  PLT 145* 116*   Basic Metabolic Panel: Recent Labs  Lab 02/28/18 1344 03/01/18 0533  NA 138 137  K 3.9 4.4  CL 104 106  CO2 28 27  GLUCOSE 85 219*  BUN 13 18  CREATININE 0.95 1.00  CALCIUM 8.1* 7.8*   GFR: Estimated Creatinine Clearance: 54.5 mL/min (by C-G formula based on SCr of 1 mg/dL). Liver Function Tests: No results for input(s): AST, ALT, ALKPHOS, BILITOT, PROT, ALBUMIN in the last 168 hours. No  results for input(s): LIPASE, AMYLASE in the last 168 hours. No results for input(s): AMMONIA in the last 168 hours. Coagulation Profile: No results for input(s): INR, PROTIME in the last 168 hours. Cardiac Enzymes: Recent Labs  Lab 02/28/18 1344  TROPONINI <0.03   BNP (last 3 results) No results for input(s): PROBNP in the last 8760 hours. HbA1C: No results for input(s): HGBA1C in the last 72 hours. CBG: No results for input(s): GLUCAP in the last 168 hours. Lipid Profile: No results for input(s): CHOL, HDL, LDLCALC, TRIG, CHOLHDL, LDLDIRECT in the last 72 hours. Thyroid Function Tests: No results for input(s): TSH, T4TOTAL, FREET4, T3FREE, THYROIDAB in the last 72 hours. Anemia Panel: No results for input(s): VITAMINB12, FOLATE, FERRITIN, TIBC, IRON, RETICCTPCT in the last 72 hours. Sepsis Labs: No results for input(s): PROCALCITON, LATICACIDVEN in the last 168 hours.  Recent Results (from the past 240 hour(s))  Culture, blood (routine x 2) Call MD if unable to obtain prior to antibiotics being given     Status: None (Preliminary result)   Collection Time: 02/28/18  6:45 PM  Result Value Ref Range Status   Specimen Description BLOOD RIGHT ANTECUBITAL  Final   Special Requests   Final    BOTTLES DRAWN AEROBIC ONLY Blood Culture adequate volume   Culture   Final    NO GROWTH < 24 HOURS Performed at [facility], [address] [place], [place] [address]    Report Status PENDING  Incomplete  Culture, blood (routine x 2) Call MD if unable to obtain prior to antibiotics being given     Status: None (Preliminary result)   Collection Time: 02/28/18  7:03 PM  Result Value Ref Range Status   Specimen Description BLOOD RIGHT ANTECUBITAL  Final   Special Requests   Final    BOTTLES DRAWN AEROBIC ONLY Blood Culture adequate volume   Culture   Final    NO GROWTH < 24 HOURS Performed at [facility], [address] [place], [place] [address]    Report Status PENDING   Incomplete         Radiology Studies: Dg Chest 2 View  Result Date: 02/28/2018 CLINICAL DATA:  Cough, pneumonia, shortness of breath for 6 weeks, congestion, weakness, nausea and vomiting after eating, history CHF, COPD, hypertension EXAM: CHEST - 2 VIEW COMPARISON:  11/17/2017 FINDINGS: Minimal enlargement of cardiac silhouette. Atherosclerotic calcifications aorta. Diffuse chronic interstitial lung disease changes progressive since 2016. Question superimposed LEFT upper lobe infiltrate versus worsening of chronic interstitial disease. Pleural thickening versus small effusion lower RIGHT chest. No pneumothorax or acute osseous findings. IMPRESSION: Chronic interstitial lung disease changes/fibrosis with question superimposed acute LEFT upper lobe infiltrate. Electronically Signed   By: Ulyses SouthwardMark  Boles M.D.   On: 02/28/2018 12:59        Scheduled Meds: . aspirin EC  81 mg Oral Daily  . azithromycin  500 mg Oral Q24H  .  clonazePAM  1 mg Oral BID  . enoxaparin (LOVENOX) injection  40 mg Subcutaneous Q24H  . feeding supplement (ENSURE ENLIVE)  237 mL Oral BID BM  . FLUoxetine  20 mg Oral Daily  . mometasone-formoterol  2 puff Inhalation BID  . potassium chloride SA  20 mEq Oral Daily  . predniSONE  40 mg Oral Q breakfast   Continuous Infusions: . cefTRIAXone (ROCEPHIN)  IV 1 g (02/28/18 2344)     LOS: 0 days    Time spent: 40 minutes   Lahoma Crocker, MD FACP [facility] Pager [phone]  If 7PM-7AM, please contact night-coverage [email or web address] Password [facility] 03/01/2018, 4:32 PM

## 2018-03-01 NOTE — Progress Notes (Signed)
CSW met with patient to follow up on consult for SNF. CSW noted patient was responsive to SNF and wanted to look for placement in [place], [place] to be near her sister-in-law. CSW request [facility], however notes P[facility] was not immediately approved as PASSAR Screen was still running. CSW will follow up as needed.   Lamonte Richer, LCSW, [place] Worker II [phone]

## 2018-03-02 DIAGNOSIS — J181 Lobar pneumonia, unspecified organism: Secondary | ICD-10-CM | POA: Diagnosis not present

## 2018-03-02 LAB — BASIC METABOLIC PANEL
ANION GAP: 5 (ref 5–15)
BUN: 21 mg/dL (ref 8–23)
CALCIUM: 8 mg/dL — AB (ref 8.9–10.3)
CO2: 25 mmol/L (ref 22–32)
Chloride: 105 mmol/L (ref 98–111)
Creatinine, Ser: 0.94 mg/dL (ref 0.44–1.00)
GFR calc Af Amer: >60 mL/min (ref >60)
Glucose, Bld: 108 mg/dL — ABNORMAL HIGH (ref 70–99)
Potassium: 4.3 mmol/L (ref 3.5–5.1)
Sodium: 135 mmol/L (ref 135–145)

## 2018-03-02 LAB — URINALYSIS, ROUTINE W REFLEX MICROSCOPIC
Bacteria, UA: NONE SEEN
Glucose, UA: NEGATIVE mg/dL
Hgb urine dipstick: NEGATIVE
Ketones, ur: NEGATIVE mg/dL
Leukocytes, UA: NEGATIVE
Nitrite: NEGATIVE
Protein, ur: 30 mg/dL — AB
Specific Gravity, Urine: 1.045 — ABNORMAL HIGH (ref 1.005–1.030)
pH: 5 (ref 5.0–8.0)

## 2018-03-02 LAB — CBC WITH DIFFERENTIAL/PLATELET
Abs Immature Granulocytes: 0.03 10*3/uL (ref 0.00–0.07)
Basophils Absolute: 0 10*3/uL (ref 0.0–0.1)
Basophils Relative: 0 %
Eosinophils Absolute: 0 10*3/uL (ref 0.0–0.5)
Eosinophils Relative: 0 %
HCT: 28.5 % — ABNORMAL LOW (ref 36.0–46.0)
Hemoglobin: 8.7 g/dL — ABNORMAL LOW (ref 12.0–15.0)
IMMATURE GRANULOCYTES: 0 %
Lymphocytes Relative: 12 %
Lymphs Abs: 1.1 10*3/uL (ref 0.7–4.0)
MCH: 32.2 pg (ref 26.0–34.0)
MCHC: 30.5 g/dL (ref 30.0–36.0)
MCV: 105.6 fL — ABNORMAL HIGH (ref 80.0–100.0)
MONOS PCT: 6 %
Monocytes Absolute: 0.6 10*3/uL (ref 0.1–1.0)
NEUTROS PCT: 82 %
Neutro Abs: 7.6 10*3/uL (ref 1.7–7.7)
Platelets: 115 10*3/uL — ABNORMAL LOW (ref 150–400)
RBC: 2.7 MIL/uL — ABNORMAL LOW (ref 3.87–5.11)
RDW: 14.1 % (ref 11.5–15.5)
WBC: 9.3 10*3/uL (ref 4.0–10.5)
nRBC: 0 % (ref 0.0–0.2)

## 2018-03-02 LAB — PREALBUMIN: Prealbumin: 6.4 mg/dL — ABNORMAL LOW (ref 18–38)

## 2018-03-02 LAB — MAGNESIUM: Magnesium: 1.8 mg/dL (ref 1.7–2.4)

## 2018-03-02 LAB — STREP PNEUMONIAE URINARY ANTIGEN: Strep Pneumo Urinary Antigen: NEGATIVE

## 2018-03-02 MED ORDER — ORAL CARE MOUTH RINSE
15.0000 mL | Freq: Two times a day (BID) | OROMUCOSAL | Status: DC
  Administered 2018-03-02 – 2018-03-06 (×9): 15 mL via OROMUCOSAL

## 2018-03-02 NOTE — Progress Notes (Signed)
Pt has arrived to 5 west 05. Alert and oriented x 4, pt identified appropriately. VS stable, no signs of acute distress, pt denied chest pain, SOB. Pt blind, oriented to room and equipment, bed alarm in place and pt instructed to call for assistance. Call bell with in reach. Pt c/o burning when urinating, NP on call notified.  Will continue to monitor and treat pt.

## 2018-03-02 NOTE — Progress Notes (Signed)
PROGRESS NOTE    [name] [name]  ZOX:[id number] DOB: [birth year] DOA: 02/28/2018 PCP: Ronal Fear, NP   Brief Narrative:  [name] [name] a 66 y.o.femalewith a history of COPD, 1 diastolic CHF, anxiety. Patient seen for shortness of breath that is been worsening over the past week. She is only able to ambulate a few feet before becoming short of breath and needing to sit and rest. Her symptoms are worsening. No apparent palliating or provoking factors. She has a nonproductive cough with wheezing. She has been on her inhaler, which has not helped. Denies fevers, chills, nausea, vomiting, hematemesis or hemoptysis.  Emergency Department Course: Received Solu-Medrol. Chest x-ray shows pneumonia. White count normal. No fever no fever. Patient is quite tachypneic and remains so after nebulizer treatment.  Observation course:  Remains very weak.  Has no heat at home  Seems food insecure and possibly even fearful.  PT and OT recommend skilled nursing facility placement.  Now awaiting placement.  Assessment & Plan:   Principal Problem:   CAP (community acquired pneumonia) Active Problems:   Dyspnea   Anxiety   COPD with acute exacerbation (HCC)   CHF (congestive heart failure) (HCC)   1. Community-acquired pneumonia a. Observation due to continued dyspnea and tachypnea. Patient is unable to return home due to safety concerns.  Will contact [facility] for assistance b. Antibiotics:Ceftin and azithromycin c. Robitussin d. Blood cultures drawn in the emergency department e. Sputum cultures f. CBC ok g. Strep antigen by urine 2. COPD with acute exacerbation a. Continue steroids and nebulizer treatments 3. Dyspnea a. Improved, although continues to be dyspneic with talking and exertion. Patient also has tachypnea 4. Anxiety a. Continue home regimen 5. Diastolic CHF a. Compensated 6.  Malnutrition?  Seems to have sunken eyes and will check prealbumenm DVT  prophylaxis:Lovenox Consultants:None Code Status:Full code Family Communication:None Disposition Plan:To return home following improvement of respiratory status   Procedures:   None  Antimicrobials:   azithromycin day 2  Ceftriaxone day 2     Subjective: Patient still feels weak and sick although looks a little bit brighter than yesterday.  She is improving with care.  She will require skilled nursing facility placement.  Objective: Vitals:   03/01/18 2000 03/02/18 0056 03/02/18 0455 03/02/18 1457  BP: (!) 100/56 110/62 (!) 101/51 (!) 103/92  Pulse: 75 68 62 60  Resp: 18 18 16    Temp: 98.2 F (36.8 C) 98 F (36.7 C) 98.1 F (36.7 C) 98.9 F (37.2 C)  TempSrc: Oral  Oral   SpO2: 97% 100% 100% 100%  Weight:      Height:       No intake or output data in the 24 hours ending 03/02/18 1530 Filed Weights   02/28/18 1130  Weight: 77.1 kg    Examination:  General exam: Appears calm and comfortable  Respiratory system: Clear to auscultation. Respiratory effort normal. Cardiovascular system: S1 & S2 heard, RRR. No JVD, murmurs, rubs, gallops or clicks. No pedal edema. Gastrointestinal system: Abdomen is nondistended, soft and nontender. No organomegaly or masses felt. Normal bowel sounds heard. Central nervous system: Alert and oriented. No focal neurological deficits. Extremities: Symmetric 5 x 5 power. Skin: No rashes, lesions or ulcers Psychiatry: Judgement and insight appear normal. Mood & affect appropriate.     Data Reviewed: I have personally reviewed following labs and imaging studies  CBC: Recent Labs  Lab 02/28/18 1344 03/01/18 0533 03/02/18 0847  WBC 6.3 3.6* 9.3  NEUTROABS 4.5  --  7.6  HGB 11.2* 9.1* 8.7*  HCT 37.4 29.5* 28.5*  MCV 104.8* 104.2* 105.6*  PLT 145* 116* 115*   Basic Metabolic Panel: Recent Labs  Lab 02/28/18 1344 03/01/18 0533 03/02/18 0847  NA 138 137 135  K 3.9 4.4 4.3  CL 104 106 105  CO2 28 27 25   GLUCOSE 85  219* 108*  BUN 13 18 21   CREATININE 0.95 1.00 0.94  CALCIUM 8.1* 7.8* 8.0*  MG  --   --  1.8   Cardiac Enzymes: Recent Labs  Lab 02/28/18 1344  TROPONINI <0.03     Recent Results (from the past 240 hour(s))  Culture, blood (routine x 2) Call MD if unable to obtain prior to antibiotics being given     Status: None (Preliminary result)   Collection Time: 02/28/18  6:45 PM  Result Value Ref Range Status   Specimen Description BLOOD RIGHT ANTECUBITAL  Final   Special Requests   Final    BOTTLES DRAWN AEROBIC ONLY Blood Culture adequate volume   Culture   Final    NO GROWTH < 24 HOURS Performed at [facility], [address] [place], [place] [address]    Report Status PENDING  Incomplete  Culture, blood (routine x 2) Call MD if unable to obtain prior to antibiotics being given     Status: None (Preliminary result)   Collection Time: 02/28/18  7:03 PM  Result Value Ref Range Status   Specimen Description BLOOD RIGHT ANTECUBITAL  Final   Special Requests   Final    BOTTLES DRAWN AEROBIC ONLY Blood Culture adequate volume   Culture   Final    NO GROWTH < 24 HOURS Performed at [facility], [address] [place], [place] [address]    Report Status PENDING  Incomplete         Radiology Studies: No results found.      Scheduled Meds: . aspirin EC  81 mg Oral Daily  . azithromycin  500 mg Oral Q24H  . clonazePAM  1 mg Oral BID  . enoxaparin (LOVENOX) injection  40 mg Subcutaneous Q24H  . feeding supplement (ENSURE ENLIVE)  237 mL Oral BID BM  . FLUoxetine  20 mg Oral Daily  . mouth rinse  15 mL Mouth Rinse BID  . mometasone-formoterol  2 puff Inhalation BID  . potassium chloride SA  20 mEq Oral Daily  . predniSONE  40 mg Oral Q breakfast   Continuous Infusions: . cefTRIAXone (ROCEPHIN)  IV 1 g (03/02/18 0133)     LOS: 0 days    Time spent: 35 minutes    Lahoma Crockerheresa C , MD FACP [facility] Pager [phone]  If 7PM-7AM, please  contact night-coverage [email or web address] Password [email or web address] 03/02/2018, 3:30 PM

## 2018-03-02 NOTE — Evaluation (Signed)
Occupational Therapy Evaluation Patient Details Name: [name] [name] MRN: [id number] DOB: [birth year] Today's Date: 03/02/2018    History of Present Illness Pt is a 67 y/o female who presents with SOB and decreasing tolerance for functional activity. X-ray revealed PNA. PMH significant for HTN, hiatal hernia, dizziness, depression, degenerative disorder of R eye, COPD, CHF, acute renal failure.    Clinical Impression   PTA, pt was living with her son and requiring some assistance for ADL and IADL participation. She currently is significantly limited by poor tolerance for activity. She fatigues easily with minimal activity and requires significant assistance for ADL participation at this time. Recommend SNF level rehabilitation to maximize return to PLOF and independence with ADL participation prior to returning home. Will continue to follow acutely.     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 Pt reporting she can see shadows and was able to point out objects ~8 feet in front of her in room. Difficulty seeing details Restrictions Weight Bearing Restrictions: No      Mobility Bed Mobility Overal bed mobility: Needs Assistance Bed Mobility: Supine to Sit;Sit to Supine     Supine to sit: Min guard     General bed mobility comments: VC's for sequencing and safety.   Transfers Overall transfer level: Needs assistance Equipment used: Rolling walker (2 wheeled);1 person hand held assist Transfers: Sit to/from [employer] Sit to Stand: Min assist;Mod assist Stand pivot transfers: Mod assist       General transfer comment: Min assist for power-up to full stand, mod assist to pivot.  Pt required mod  assist for balance support without RW. With BUE support on walker, less assist for support once standing.     Balance Overall balance assessment: Needs assistance Sitting-balance support: Feet supported;No upper extremity supported Sitting balance-Leahy Scale: Poor Sitting balance - Comments: posterior lean   Standing balance support: Bilateral upper extremity supported Standing balance-Leahy Scale: Poor Standing balance comment: relies on BUE support and assistance                           ADL either performed or assessed with clinical judgement   ADL Overall ADL's : Needs assistance/impaired Eating/Feeding: Set up;Sitting   Grooming: Set up;Sitting   Upper Body Bathing: Minimal assistance;Sitting   Lower Body Bathing: Maximal assistance;Sit to/from stand   Upper Body Dressing : Minimal assistance;Sitting   Lower Body Dressing: Maximal assistance;Sit to/from stand   Toilet Transfer: Moderate assistance;Stand-pivot Toilet Transfer Details (indicate cue type and reason): Holding onto bed rail Toileting- Clothing Manipulation and Hygiene: Maximal assistance;Sit to/from stand(RW) Toileting - Clothing Manipulation Details (indicate cue type and reason): pt in soaked bed on my arrival despite pure whick (it was out of place). Required max assist to complete pericare due to instability and need to hold ro RW.      Functional mobility during ADLs: Moderate assistance(handheld assist) General ADL Comments: Pt limited by weakness. On 1L supplemental O2 via [place].      Vision Baseline Vision/History: Legally blind Patient Visual Report: (Blind R eye, nearly blind L eye) Vision Assessment?: Vision impaired- to be further tested in functional context Additional Comments: Pt with significant visual  deficits at baseline.      Perception     Praxis      Pertinent Vitals/Pain Pain Assessment: No/denies pain     Hand Dominance Right   Extremity/Trunk Assessment Upper  Extremity Assessment Upper Extremity Assessment: Generalized weakness   Lower Extremity Assessment Lower Extremity Assessment: Generalized weakness   Cervical / Trunk Assessment Cervical / Trunk Assessment: Kyphotic;Other exceptions Cervical / Trunk Exceptions: Forward head posture with rounded shoulders   Communication Communication Communication: HOH   Cognition Arousal/Alertness: Awake/alert Behavior During Therapy: Anxious Overall Cognitive Status: Impaired/Different from baseline Area of Impairment: Attention;Following commands;Memory;Safety/judgement;Awareness;Problem solving                   Current Attention Level: Sustained Memory: Decreased short-term memory Following Commands: Follows one step commands consistently;Follows one step commands with increased time;Follows multi-step commands inconsistently Safety/Judgement: Decreased awareness of safety Awareness: Intellectual Problem Solving: Slow processing;Decreased initiation;Difficulty sequencing;Requires verbal cues General Comments: Pt slightly confused this session. She was initially asleep but able to awake and participate well.    General Comments  Pt keeping her belongings very close to her due to visual deficits.     Exercises     Shoulder Instructions      Home Living Family/patient expects to be discharged to:: Skilled nursing facility Living Arrangements: Children Available Help at Discharge: Family;Available PRN/intermittently Type of Home: Mobile home Home Access: Stairs to enter Entrance Stairs-Number of Steps: 5 Entrance Stairs-Rails: Right;Left;Can reach both Home Layout: One level     Bathroom Shower/Tub: Chief Strategy Officer: Handicapped height     Home Equipment: Environmental consultant - 2 wheels;Cane - single point;Walker - 4 wheels   Additional Comments: Dan Humphreys does not fit through hallways per pt. Her son lives with her but she reports that he is "not much help." Her other son  is also involved in her care.       Prior Functioning/Environment Level of Independence: Needs assistance  Gait / Transfers Assistance Needed: Uses wall mostly for support - has a walker but it is difficult to maneuver the walker around her mobile home ADL's / Homemaking Assistance Needed: Son provides set up for dressing but pt states she is able to dress herself. Son leaves her washrags in the bathroom and she sits on the commode to clean up. Has not been in the shower in years per pt. Son assists with grocery shopping.    Comments: Wears pull up briefs at home.         OT Problem List: Decreased strength;Decreased range of motion;Decreased activity tolerance;Impaired balance (sitting and/or standing);Decreased safety awareness;Decreased knowledge of use of DME or AE;Decreased knowledge of precautions;Pain      OT Treatment/Interventions: Self-care/ADL training;Therapeutic exercise;Energy conservation;DME and/or AE instruction;Therapeutic activities;Patient/family education;Balance training;Cognitive remediation/compensation;Visual/perceptual remediation/compensation    OT Goals(Current goals can be found in the care plan section) Acute Rehab OT Goals Patient Stated Goal: Be more independent OT Goal Formulation: With patient Time For Goal Achievement: 03/16/18 Potential to Achieve Goals: Good ADL Goals Pt Will Perform Lower Body Dressing: with modified independence;sit to/from stand Pt Will Transfer to Toilet: with modified independence;ambulating;regular height toilet;bedside commode Pt Will Perform Toileting - Clothing Manipulation and hygiene: with modified independence;sit to/from stand Additional ADL Goal #1: Pt will demonstrate improved activity tolerance for ADL participation to complete 10 minute task without rest break.  OT Frequency: Min 2X/week   Barriers to D/C: Inaccessible home environment;Decreased caregiver support          Co-evaluation  AM-PAC OT "6 Clicks" Daily Activity     Outcome Measure Help from another person eating meals?: None Help from another person taking care of personal grooming?: None Help from another person toileting, which includes using toliet, bedpan, or urinal?: A Lot Help from another person bathing (including washing, rinsing, drying)?: A Lot Help from another person to put on and taking off regular upper body clothing?: A Little Help from another person to put on and taking off regular lower body clothing?: A Lot 6 Click Score: 17   End of Session Equipment Utilized During Treatment: Rolling walker;Gait belt Nurse Communication: Mobility status  Activity Tolerance: Patient tolerated treatment well Patient left: in bed  OT Visit Diagnosis: Other abnormalities of gait and mobility (R26.89);Low vision, both eyes (H54.2)                Time: 0925-0959 OT Time [id number] (min): 34 min Charges:  OT General Charges $OT Visit: 1 Visit OT Evaluation $OT Eval Moderate Complexity: 1 Mod OT Treatments $Self Care/Home Management : 8-22 mins  Derrell Lollingharity Anne Aletha Allebach, OTR/L Acute Rehabilitation Services  Office [phone]   Shlonda Dolloff A Kaydince Towles 03/02/2018, 11:01 AM

## 2018-03-02 NOTE — ED Notes (Signed)
Report given to Adline MangoIvana, RN on 5W.

## 2018-03-03 DIAGNOSIS — Z8701 Personal history of pneumonia (recurrent): Secondary | ICD-10-CM | POA: Diagnosis not present

## 2018-03-03 DIAGNOSIS — J181 Lobar pneumonia, unspecified organism: Secondary | ICD-10-CM | POA: Diagnosis not present

## 2018-03-03 DIAGNOSIS — R2689 Other abnormalities of gait and mobility: Secondary | ICD-10-CM | POA: Diagnosis not present

## 2018-03-03 DIAGNOSIS — I130 Hypertensive heart and chronic kidney disease with heart failure and stage 1 through stage 4 chronic kidney disease, or unspecified chronic kidney disease: Secondary | ICD-10-CM | POA: Diagnosis present

## 2018-03-03 DIAGNOSIS — Z9851 Tubal ligation status: Secondary | ICD-10-CM | POA: Diagnosis not present

## 2018-03-03 DIAGNOSIS — M1711 Unilateral primary osteoarthritis, right knee: Secondary | ICD-10-CM | POA: Diagnosis present

## 2018-03-03 DIAGNOSIS — R1312 Dysphagia, oropharyngeal phase: Secondary | ICD-10-CM | POA: Diagnosis not present

## 2018-03-03 DIAGNOSIS — D649 Anemia, unspecified: Secondary | ICD-10-CM | POA: Diagnosis not present

## 2018-03-03 DIAGNOSIS — I110 Hypertensive heart disease with heart failure: Secondary | ICD-10-CM | POA: Diagnosis not present

## 2018-03-03 DIAGNOSIS — I5032 Chronic diastolic (congestive) heart failure: Secondary | ICD-10-CM | POA: Diagnosis present

## 2018-03-03 DIAGNOSIS — J9601 Acute respiratory failure with hypoxia: Secondary | ICD-10-CM | POA: Diagnosis present

## 2018-03-03 DIAGNOSIS — Z7982 Long term (current) use of aspirin: Secondary | ICD-10-CM | POA: Diagnosis not present

## 2018-03-03 DIAGNOSIS — Z741 Need for assistance with personal care: Secondary | ICD-10-CM | POA: Diagnosis not present

## 2018-03-03 DIAGNOSIS — K219 Gastro-esophageal reflux disease without esophagitis: Secondary | ICD-10-CM | POA: Diagnosis present

## 2018-03-03 DIAGNOSIS — F419 Anxiety disorder, unspecified: Secondary | ICD-10-CM | POA: Diagnosis present

## 2018-03-03 DIAGNOSIS — J441 Chronic obstructive pulmonary disease with (acute) exacerbation: Secondary | ICD-10-CM | POA: Diagnosis not present

## 2018-03-03 DIAGNOSIS — N182 Chronic kidney disease, stage 2 (mild): Secondary | ICD-10-CM | POA: Diagnosis present

## 2018-03-03 DIAGNOSIS — R41841 Cognitive communication deficit: Secondary | ICD-10-CM | POA: Diagnosis not present

## 2018-03-03 DIAGNOSIS — Z7401 Bed confinement status: Secondary | ICD-10-CM | POA: Diagnosis not present

## 2018-03-03 DIAGNOSIS — R0600 Dyspnea, unspecified: Secondary | ICD-10-CM | POA: Diagnosis not present

## 2018-03-03 DIAGNOSIS — M6281 Muscle weakness (generalized): Secondary | ICD-10-CM | POA: Diagnosis not present

## 2018-03-03 DIAGNOSIS — F329 Major depressive disorder, single episode, unspecified: Secondary | ICD-10-CM | POA: Diagnosis present

## 2018-03-03 DIAGNOSIS — J9621 Acute and chronic respiratory failure with hypoxia: Secondary | ICD-10-CM | POA: Diagnosis not present

## 2018-03-03 DIAGNOSIS — D696 Thrombocytopenia, unspecified: Secondary | ICD-10-CM | POA: Diagnosis present

## 2018-03-03 DIAGNOSIS — Z8249 Family history of ischemic heart disease and other diseases of the circulatory system: Secondary | ICD-10-CM | POA: Diagnosis not present

## 2018-03-03 DIAGNOSIS — Z823 Family history of stroke: Secondary | ICD-10-CM | POA: Diagnosis not present

## 2018-03-03 DIAGNOSIS — Z6828 Body mass index (BMI) 28.0-28.9, adult: Secondary | ICD-10-CM | POA: Diagnosis not present

## 2018-03-03 DIAGNOSIS — H5789 Other specified disorders of eye and adnexa: Secondary | ICD-10-CM | POA: Diagnosis present

## 2018-03-03 DIAGNOSIS — J440 Chronic obstructive pulmonary disease with acute lower respiratory infection: Secondary | ICD-10-CM | POA: Diagnosis present

## 2018-03-03 DIAGNOSIS — Z87891 Personal history of nicotine dependence: Secondary | ICD-10-CM | POA: Diagnosis not present

## 2018-03-03 DIAGNOSIS — J189 Pneumonia, unspecified organism: Secondary | ICD-10-CM | POA: Diagnosis not present

## 2018-03-03 DIAGNOSIS — E440 Moderate protein-calorie malnutrition: Secondary | ICD-10-CM | POA: Diagnosis present

## 2018-03-03 DIAGNOSIS — J449 Chronic obstructive pulmonary disease, unspecified: Secondary | ICD-10-CM | POA: Diagnosis not present

## 2018-03-03 DIAGNOSIS — Z9049 Acquired absence of other specified parts of digestive tract: Secondary | ICD-10-CM | POA: Diagnosis not present

## 2018-03-03 DIAGNOSIS — E43 Unspecified severe protein-calorie malnutrition: Secondary | ICD-10-CM | POA: Diagnosis present

## 2018-03-03 DIAGNOSIS — Z79891 Long term (current) use of opiate analgesic: Secondary | ICD-10-CM | POA: Diagnosis not present

## 2018-03-03 DIAGNOSIS — D631 Anemia in chronic kidney disease: Secondary | ICD-10-CM | POA: Diagnosis present

## 2018-03-03 DIAGNOSIS — M2550 Pain in unspecified joint: Secondary | ICD-10-CM | POA: Diagnosis not present

## 2018-03-03 MED ORDER — DM-GUAIFENESIN ER 30-600 MG PO TB12
1.0000 | ORAL_TABLET | Freq: Two times a day (BID) | ORAL | Status: DC
  Administered 2018-03-03 – 2018-03-06 (×7): 1 via ORAL
  Filled 2018-03-03 (×7): qty 1
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
## 2018-03-03 NOTE — Progress Notes (Signed)
Pt had an episode of coughing, with tick mucus and wheezing. PRN breathing treatment given, MD notified.

## 2018-03-03 NOTE — H&P (Signed)
History and Physical    [name] [name] UXL:[id number] DOB: 10/20/1950 DOA: 02/28/2018  PCP: [name] Fear, NP  Patient coming from: Home is being admitted from observation status to inpatient  I have personally briefly reviewed patient's old medical records in [facility]  Chief Complaint: Shortness of breath and profound malnutrition  ZDG:UYQI [name] a 67 y.o.femalewith a history of COPD, 1 diastolic CHF, anxiety. Patient seen for shortness of breath that is been worsening over the past week. She is only able to ambulate a few feet before becoming short of breath and needing to sit and rest. Her symptoms are worsening. No apparent palliating or provoking factors. She has a nonproductive cough with wheezing. She has been on her inhaler, which has not helped. Denies fevers, chills, nausea, vomiting, hematemesis or hemoptysis. Emergency Department Course: Received Solu-Medrol. Chest x-ray shows pneumonia. White count normal. No fever no fever. Patient is quite tachypneic and remains so after nebulizer treatment. Observation course: Remains very weak. Has no heat at home Seems food insecure and possibly even fearful.  PT and OT recommend skilled nursing facility placement.  Now awaiting placement.  Patient is now being admitted due to severe malnutrition and further treatment of her pneumonia.  Subjectively patient is feeling slightly better.  Review of Systems: As per HPI otherwise all other systems reviewed and  negative.   Past Medical History:  Diagnosis Date  . Acute renal failure (HCC)   . Anemia   . Angina   . Anxiety   . Arthritis    "right knee" (02/28/2018)  . CAP (community acquired pneumonia) 02/28/2018  . CHF (congestive heart failure) (HCC)   . COPD (chronic obstructive pulmonary disease) (HCC)   . Degenerative disorder of eye    Right eye  . Depression   . Dizziness   . Fluid retention   . GERD (gastroesophageal reflux disease)   . History of  hiatal hernia   . Hypertension   . Pneumonia    "get pneumonia 2-3 times q yr" (02/28/2018)  . Shortness of breath     Past Surgical History:  Procedure Laterality Date  . FRACTURE SURGERY    . LAPAROSCOPIC CHOLECYSTECTOMY    . PATELLA FRACTURE SURGERY Right   . TUBAL LIGATION      Social History   Social History Narrative  . Not on file     reports that she quit smoking about 17 years ago. She has a 3.00 pack-year smoking history. Her smokeless tobacco use includes snuff. She reports that she does not drink alcohol or use drugs.  Allergies  Allergen Reactions  . Codeine Other (See Comments)    Feel Numb.     Family History  Problem Relation Age of Onset  . Heart failure Mother   . Stroke Father     Prior to Admission medications   Medication Sig Start Date End Date Taking? Authorizing Provider  albuterol (PROVENTIL HFA;VENTOLIN HFA) 108 (90 BASE) MCG/ACT inhaler Inhale 2 puffs into the lungs every 6 (six) hours as needed. For shortness of breath   Yes [provider]  aspirin 81 MG tablet Take 81 mg by mouth daily.   Yes [provider]  calcium-vitamin D (OSCAL) 250-125 MG-UNIT per tablet Take 1 tablet by mouth daily. 11/07/11 02/28/18 Yes Larey Seat, MD  cholecalciferol (VITAMIN D) 1000 units tablet Take 1,000 Units by mouth daily.   Yes [provider]  clonazePAM (KLONOPIN) 1 MG tablet Take 1 mg by mouth 2 (two)  times daily.   Yes [provider]  Coenzyme Q10 (COQ10) 200 MG CAPS Take 1 capsule by mouth daily.   Yes [provider]  FLUoxetine (PROZAC) 20 MG capsule Take 20 mg by mouth daily.   Yes [provider]  furosemide (LASIX) 40 MG tablet Take 40 mg by mouth daily as needed for fluid.    Yes [provider]  Iron-FA-B Cmp-C-Biot-Probiotic (FUSION PLUS PO) Take 1 capsule by mouth daily.   Yes [provider]  Multiple Vitamin (MULTIVITAMIN WITH MINERALS) TABS tablet Take 1 tablet by  mouth daily.   Yes [provider]  potassium chloride SA (K-DUR,KLOR-CON) 20 MEQ tablet Take 20 mEq by mouth daily.    Yes [provider]  Fluticasone-Salmeterol (ADVAIR) 250-50 MCG/DOSE AEPB Inhale 1 puff into the lungs 2 (two) times daily. Patient not taking: Reported on 05/07/2017 11/07/11   Larey Seat, MD  lidocaine (LIDODERM) 5 % Place 1 patch onto the skin daily. Remove & Discard patch within 12 hours or as directed by MD Patient not taking: Reported on 02/28/2018 11/17/17   Anselm Pancoast, PA-C    Physical Exam:  Constitutional: NAD, calm, comfortable Vitals:   03/02/18 2147 03/03/18 0525 03/03/18 0614 03/03/18 1342  BP: (!) 109/55 (!) 123/104 (!) 95/53 (!) 109/57  Pulse: 72 (!) 59  72  Resp: 18 18  16   Temp: 98.2 F (36.8 C) 97.6 F (36.4 C)  98 F (36.7 C)  TempSrc: Oral Oral    SpO2: 97% 100%  99%  Weight:      Height:       Eyes: PERRL, lids and conjunctivae normal sunken eyes, ENMT: Mucous membranes are moist. Posterior pharynx clear of any exudate or lesions.Normal dentition.  Neck: normal, supple, no masses, no thyromegaly Respiratory: clear to auscultation bilaterally, no wheezing, no crackles. Normal respiratory effort. No accessory muscle use.  Cardiovascular: Regular rate and rhythm, no murmurs / rubs / gallops. No extremity edema. 2+ pedal pulses. No carotid bruits.  Abdomen: no tenderness, no masses palpated. No hepatosplenomegaly. Bowel sounds positive.  Musculoskeletal: no clubbing / cyanosis. No joint deformity upper and lower extremities. Good ROM, no contractures. Normal muscle tone.  Skin: no rashes, lesions, ulcers. No induration Neurologic: CN 2-12 grossly intact. Sensation intact, DTR normal. Strength 5/5 in all 4.  Psychiatric: Normal judgment and insight. Alert and oriented x 3. Normal mood.    Labs on Admission: I have personally reviewed following labs and imaging studies  CBC: Recent Labs  Lab 02/28/18 1344 03/01/18 0533  03/02/18 0847  WBC 6.3 3.6* 9.3  NEUTROABS 4.5  --  7.6  HGB 11.2* 9.1* 8.7*  HCT 37.4 29.5* 28.5*  MCV 104.8* 104.2* 105.6*  PLT 145* 116* 115*   Basic Metabolic Panel: Recent Labs  Lab 02/28/18 1344 03/01/18 0533 03/02/18 0847  NA 138 137 135  K 3.9 4.4 4.3  CL 104 106 105  CO2 28 27 25   GLUCOSE 85 219* 108*  BUN 13 18 21   CREATININE 0.95 1.00 0.94  CALCIUM 8.1* 7.8* 8.0*  MG  --   --  1.8   Cardiac Enzymes: Recent Labs  Lab 02/28/18 1344  TROPONINI <0.03   Urine analysis:    Component Value Date/Time   COLORURINE AMBER (A) 03/01/2018 2309   APPEARANCEUR TURBID (A) 03/01/2018 2309   LABSPEC 1.045 (H) 03/01/2018 2309   PHURINE 5.0 03/01/2018 2309   GLUCOSEU NEGATIVE 03/01/2018 2309   HGBUR NEGATIVE 03/01/2018 2309  BILIRUBINUR SMALL (A) 03/01/2018 2309   KETONESUR NEGATIVE 03/01/2018 2309   PROTEINUR 30 (A) 03/01/2018 2309   UROBILINOGEN 0.2 11/06/2011 0223   NITRITE NEGATIVE 03/01/2018 2309   LEUKOCYTESUR NEGATIVE 03/01/2018 2309   Prealbumen: 6.4 (very low)   Radiological Exams on Admission: No results found.   Assessment/Plan Principal Problem:   CAP (community acquired pneumonia) Active Problems:   Dyspnea   Severe protein-calorie malnutrition (HCC)   COPD with acute exacerbation (HCC)   CHF (congestive heart failure) (HCC)   Anxiety  1. Community-acquired pneumonia a. Admit to inpatient care due to continued dyspnea and tachypnea. Patientis unable to return homedue to safety concerns. Will contact [facility] for assistance b. Antibiotics:Ceftin and azithromycin c. Robitussin and Tessalon Perles d. Blood cultures drawn in the emergency department e. Sputum cultures f. CBCok  2. COPD with acute exacerbation a. Continue steroids and nebulizer treatments 3. Dyspnea a. Improved, although continues to be dyspneic with talking and exertion. Patient also has tachypnea 4. Anxiety a. Continue home regimen 5. Diastolic  CHF a. Compensated 6. Severe malnutrition:Has sunken eyes: Temporal wasting: And pre-albumin is markedly low.  Nutrition has been consulted  DVT prophylaxis:Lovenox Consultants:None Code Status:Full code Family Communication:None Disposition Plan:To return home following improvement of respiratory status   Lahoma Crockerheresa C Sheehan MD FACP [facility] Pager [phone]  If 7PM-7AM, please contact night-coverage [email or web address] Password [email or web address]  03/03/2018, 2:12 PM

## 2018-03-03 NOTE — Care Management Note (Addendum)
Case Management Note  Patient Details  Name: Jesse FallJudy Hanford MRN: [id number] Date of Birth: 1950/12/03  Subjective/Objective: Admitted with CAP, hx of COPD, 1 diastolic CHF, anxiety. Pt from home with son who works @ [employer]. 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 PTA no assistive devices used. States independent with ADL's .      [address]Jason Doner [address]) Signa KellKevin Fryberger (Son)     [phone]     PCP: Heide GuileLynn Lam  Action/Plan: Recommendations from PT: SNF. Pt agreeable to SNF placement ... CSW aware and following.  Expected Discharge Date:                  Expected Discharge Plan:  Skilled Nursing Facility  In-House Referral:  Clinical Social Work  Discharge planning Services  CM Consult  Post Acute Care Choice:    Choice offered to:  Patient  DME Arranged:    DME Agency:     HH Arranged:    HH Agency:     Status of Service:  In process, will continue to follow  If discussed at Long Length of Stay Meetings, dates discussed:    Additional Comments:  Epifanio LeschesCole, Sruthi Maurer Hudson, RN 03/03/2018, 2:45 PM

## 2018-03-04 DIAGNOSIS — J189 Pneumonia, unspecified organism: Principal | ICD-10-CM

## 2018-03-04 DIAGNOSIS — J9621 Acute and chronic respiratory failure with hypoxia: Secondary | ICD-10-CM

## 2018-03-04 MED ORDER — SODIUM CHLORIDE 0.9 % IV BOLUS: 500.0000 mL | Freq: Once | INTRAVENOUS | Status: DC

## 2018-03-04 NOTE — Progress Notes (Addendum)
PROGRESS NOTE  Jesse FallJudy Rantz [id number] DOB: 1950-09-30 DOA: 02/28/2018 PCP: Ronal FearLam, Lynn E, NP   LOS: 1 day   Brief Narrative / Interim history: 67 year old female with history of COPD, chronic diastolic CHF, anxiety, who was admitted to the hospital on 02/28/2018 with shortness of breath progressive over the last week.  She was found herself to be significantly more and more dyspneic and decided to come to the ED.  She was also complaining of nonproductive cough with wheezing, her home inhalers have not helped.  Subjective: -Feeling a little bit better however still coughing with every sentence and complains of significant shortness of breath when she tries to get up and move around.  Denies any chest pain, denies any palpitations  Assessment & Plan: Principal Problem:   CAP (community acquired pneumonia) Active Problems:   Dyspnea   Anxiety   COPD with acute exacerbation (HCC)   CHF (congestive heart failure) (HCC)   Severe protein-calorie malnutrition (HCC)   Principal Problem Community-acquired pneumonia with acute hypoxic respiratory failure -Continue antibiotics with ceftriaxone and azithromycin, improved some however still significantly dyspneic, wean off oxygen as tolerated  Additional Problems COPD exacerbation -Wheezing has improved, continue prednisone, nebulizers as needed, remains tachypneic and dyspneic.  Wean off oxygen as tolerated  Anxiety -Continue home regimen with clonazepam  Chronic diastolic CHF -She appears euvolemic  Anemia of chronic disease -Hemoglobin overall stable, slightly downtrending but will continue to monitor.  No bleeding from  Thrombocytopenia -Chronic, stable  Scheduled Meds: . aspirin EC  81 mg Oral Daily  . azithromycin  500 mg Oral Q24H  . clonazePAM  1 mg Oral BID  . dextromethorphan-guaiFENesin  1 tablet Oral BID  . enoxaparin (LOVENOX) injection  40 mg Subcutaneous Q24H  . feeding supplement (ENSURE ENLIVE)  237 mL Oral  BID BM  . FLUoxetine  20 mg Oral Daily  . mouth rinse  15 mL Mouth Rinse BID  . mometasone-formoterol  2 puff Inhalation BID  . potassium chloride SA  20 mEq Oral Daily  . predniSONE  40 mg Oral Q breakfast   Continuous Infusions: . cefTRIAXone (ROCEPHIN)  IV Stopped (03/03/18 2357)   PRN Meds:.albuterol  DVT prophylaxis: Lovenox Code Status: Full code Family Communication: No family at bedside Disposition Plan: SNF when improved  Consultants:   None  Procedures:   none  Antimicrobials:  Ceftrixone 12/13>>  Azithromycin 12/13 >>   Objective: Vitals:   03/03/18 1918 03/03/18 2204 03/04/18 0453 03/04/18 0840  BP:  (!) 104/51 108/64   Pulse:  73 68   Resp:      Temp:  98 F (36.7 C) 97.6 F (36.4 C)   TempSrc:  Oral Oral   SpO2: 98% 100% 100% 97%  Weight:      Height:        Intake/Output Summary (Last 24 hours) at 03/04/2018 1044 Last data filed at 03/04/2018 0500 Gross per 24 hour  Intake 540.06 ml  Output 500 ml  Net 40.06 ml   Filed Weights   02/28/18 1130  Weight: 77.1 kg    Examination:  Constitutional: Appears weak, tachypneic Eyes: PERRL, lids and conjunctivae normal ENMT: Mucous membranes are moist.  Neck: normal, supple Respiratory: Faint end expiratory wheezing, no crackles heard, slightly increased respiratory effort Cardiovascular: Regular rate and rhythm, no murmurs / rubs / gallops. No LE edema.  Abdomen: no tenderness. Bowel sounds positive.  Musculoskeletal: no clubbing / cyanosis Skin: no rashes Neurologic: CN 2-12 grossly intact. Strength 5/5 in all  4.  Psychiatric: Normal judgment and insight. Alert and oriented x 3. Normal mood.    Data Reviewed: I have independently reviewed following labs and imaging studies   CBC: Recent Labs  Lab 02/28/18 1344 03/01/18 0533 03/02/18 0847  WBC 6.3 3.6* 9.3  NEUTROABS 4.5  --  7.6  HGB 11.2* 9.1* 8.7*  HCT 37.4 29.5* 28.5*  MCV 104.8* 104.2* 105.6*  PLT 145* 116* 115*   Basic  Metabolic Panel: Recent Labs  Lab 02/28/18 1344 03/01/18 0533 03/02/18 0847  NA 138 137 135  K 3.9 4.4 4.3  CL 104 106 105  CO2 28 27 25   GLUCOSE 85 219* 108*  BUN 13 18 21   CREATININE 0.95 1.00 0.94  CALCIUM 8.1* 7.8* 8.0*  MG  --   --  1.8   GFR: Estimated Creatinine Clearance: 58 mL/min (by C-G formula based on SCr of 0.94 mg/dL). Liver Function Tests: No results for input(s): AST, ALT, ALKPHOS, BILITOT, PROT, ALBUMIN in the last 168 hours. No results for input(s): LIPASE, AMYLASE in the last 168 hours. No results for input(s): AMMONIA in the last 168 hours. Coagulation Profile: No results for input(s): INR, PROTIME in the last 168 hours. Cardiac Enzymes: Recent Labs  Lab 02/28/18 1344  TROPONINI <0.03   BNP (last 3 results) No results for input(s): PROBNP in the last 8760 hours. HbA1C: No results for input(s): HGBA1C in the last 72 hours. CBG: No results for input(s): GLUCAP in the last 168 hours. Lipid Profile: No results for input(s): CHOL, HDL, LDLCALC, TRIG, CHOLHDL, LDLDIRECT in the last 72 hours. Thyroid Function Tests: No results for input(s): TSH, T4TOTAL, FREET4, T3FREE, THYROIDAB in the last 72 hours. Anemia Panel: No results for input(s): VITAMINB12, FOLATE, FERRITIN, TIBC, IRON, RETICCTPCT in the last 72 hours. Urine analysis:    Component Value Date/Time   COLORURINE AMBER (A) 03/01/2018 2309   APPEARANCEUR TURBID (A) 03/01/2018 2309   LABSPEC 1.045 (H) 03/01/2018 2309   PHURINE 5.0 03/01/2018 2309   GLUCOSEU NEGATIVE 03/01/2018 2309   HGBUR NEGATIVE 03/01/2018 2309   BILIRUBINUR SMALL (A) 03/01/2018 2309   KETONESUR NEGATIVE 03/01/2018 2309   PROTEINUR 30 (A) 03/01/2018 2309   UROBILINOGEN 0.2 11/06/2011 0223   NITRITE NEGATIVE 03/01/2018 2309   LEUKOCYTESUR NEGATIVE 03/01/2018 2309   Sepsis Labs: Invalid input(s): PROCALCITONIN, LACTICIDVEN  Recent Results (from the past 240 hour(s))  Culture, blood (routine x 2) Call MD if unable to  obtain prior to antibiotics being given     Status: None (Preliminary result)   Collection Time: 02/28/18  6:45 PM  Result Value Ref Range Status   Specimen Description BLOOD RIGHT ANTECUBITAL  Final   Special Requests   Final    BOTTLES DRAWN AEROBIC ONLY Blood Culture adequate volume   Culture   Final    NO GROWTH 4 DAYS Performed at [facility], [address], [place], [place] [address]    Report Status PENDING  Incomplete  Culture, blood (routine x 2) Call MD if unable to obtain prior to antibiotics being given     Status: None (Preliminary result)   Collection Time: 02/28/18  7:03 PM  Result Value Ref Range Status   Specimen Description BLOOD RIGHT ANTECUBITAL  Final   Special Requests   Final    BOTTLES DRAWN AEROBIC ONLY Blood Culture adequate volume   Culture   Final    NO GROWTH 4 DAYS Performed at [facility], [address], [place], [place]  [address]    Report Status PENDING  Incomplete      Radiology Studies: No results found.   Pamella Pert, MD, PhD [facility] Pager [phone]  If 7PM-7AM, please contact night-coverage [email or web address] Password [facility] 03/04/2018, 10:44 AM

## 2018-03-04 NOTE — Progress Notes (Signed)
Physical Therapy Treatment Patient Details Name: [name] [name] MRN: [id number] DOB: 1950/11/11 Today's Date: 03/04/2018    History of Present Illness Pt is a 67 y/o female who presents with SOB and decreasing tolerance for functional activity. X-ray revealed PNA. PMH significant for HTN, hiatal hernia, dizziness, depression, degenerative disorder of R eye, COPD, CHF, acute renal failure.     PT Comments    Pt performed gait training and functional mobility with progression to gait in halls with close chair follow.  Pt required cues for RW safety and fatigues quickly.  Productive cough noted during session.  Pt continues to be appropriate for SNF placement to improve strength and function before returning home.  Plan next session for LE strengthening and progression of gait training to tolerance.    Follow Up Recommendations  SNF;Supervision/Assistance - 24 hour     Equipment Recommendations  None recommended by PT    Recommendations for Other Services       Precautions / Restrictions Precautions Precautions: Fall Precaution Comments: Pt reports she is blind in her R eye and partially blind in her L eye. Pt reporting she can see shadows and was able to point out objects ~8 feet in front of her in room. Difficulty seeing details Restrictions Weight Bearing Restrictions: No    Mobility  Bed Mobility Overal bed mobility: Needs Assistance Bed Mobility: Supine to Sit     Supine to sit: Min guard     General bed mobility comments: Pt in recliner on arrival.  Able to participate in trunk elevation from recliner back.  Min assistance to complete.  Transfers Overall transfer level: Needs assistance Equipment used: Rolling walker (2 wheeled) Transfers: Sit to/from Stand Sit to Stand: Min assist Stand pivot transfers: Min assist       General transfer comment: Min assistance to boost into standing.  Cues for hand placement to and from seated surface.  Pt required increased time  to locate arm rests for hand placement due to poor vision.    Ambulation/Gait Ambulation/Gait assistance: Min assist;+2 safety/equipment Gait Distance (Feet): 20 Feet Assistive device: Rolling walker (2 wheeled) Gait Pattern/deviations: Decreased stride length;Trunk flexed;Step-through pattern     General Gait Details: Pt with unsteady gait and poor navigation of obstacles due to poor vision.  Pt required cues for upper trunk control and assist to shift weight and right balance.     Stairs             Wheelchair Mobility    Modified Rankin (Stroke Patients Only)       Balance Overall balance assessment: Needs assistance Sitting-balance support: Feet supported;Single extremity supported Sitting balance-Leahy Scale: Fair       Standing balance-Leahy Scale: Poor Standing balance comment: relies on BUE support and assistance for brief stand pivot transfers, does not stand statically more than briefly secondary to anxiety and generalized weakness                            Cognition Arousal/Alertness: Awake/alert Behavior During Therapy: WFL for tasks assessed/performed;Anxious Overall Cognitive Status: Impaired/Different from baseline Area of Impairment: Attention;Following commands;Memory;Safety/judgement;Awareness;Problem solving                   Current Attention Level: Sustained Memory: Decreased short-term memory Following Commands: Follows one step commands consistently;Follows one step commands with increased time;Follows multi-step commands inconsistently Safety/Judgement: Decreased awareness of safety Awareness: Intellectual Problem Solving: Slow processing;Decreased initiation;Difficulty sequencing;Requires verbal cues  Exercises      General Comments        Pertinent Vitals/Pain Pain Assessment: No/denies pain    Home Living                      Prior Function            PT Goals (current goals can now be  found in the care plan section) Acute Rehab PT Goals Patient Stated Goal: Not stated Potential to Achieve Goals: Fair Progress towards PT goals: Progressing toward goals    Frequency    Min 2X/week      PT Plan Current plan remains appropriate    Co-evaluation              AM-PAC PT "6 Clicks" Mobility   Outcome Measure  Help needed turning from your back to your side while in a flat bed without using bedrails?: A Little Help needed moving from lying on your back to sitting on the side of a flat bed without using bedrails?: A Little Help needed moving to and from a bed to a chair (including a wheelchair)?: A Little Help needed standing up from a chair using your arms (e.g., wheelchair or bedside chair)?: A Little Help needed to walk in hospital room?: A Lot Help needed climbing 3-5 steps with a railing? : Total 6 Click Score: 15    End of Session Equipment Utilized During Treatment: Gait belt Activity Tolerance: Patient limited by fatigue Patient left: with call bell/phone within reach;with bed alarm set;in chair Nurse Communication: Mobility status PT Visit Diagnosis: History of falling (Z91.81);Muscle weakness (generalized) (M62.81);Difficulty in walking, not elsewhere classified (R26.2)     Time: 6213-0865 PT Time Calculation (min) (ACUTE ONLY): 27 min  Charges:  $Gait Training: 8-22 mins $Therapeutic Activity: 8-22 mins                     [name] [name], PTA Acute Rehabilitation Services Pager [phone] Office [phone]     [name] [name] Artis Delay 03/04/2018, 2:43 PM

## 2018-03-04 NOTE — [place] FL2 (Signed)
[place] MEDICAID FL2 LEVEL OF CARE SCREENING TOOL     IDENTIFICATION  Patient Name: [name] [name] Birthdate: [birth year] Sex: female Admission Date (Current Location): 02/28/2018  [facility] and [place] Number:  Producer, television/film/video and Address:  [facility], [address], [place], [place] [address]      Provider Number: [id number]  Attending Physician Name and Address:  Leatha Gilding, MD  Relative Name and Phone Number:  Barbara Cower, son, [phone]    Current Level of Care: Hospital Recommended Level of Care: Skilled Nursing Facility Prior Approval Number:    Date Approved/Denied:   PASRR Number: Pending  Discharge Plan: SNF    Current Diagnoses: Patient Active Problem List   Diagnosis Date Noted  . Severe protein-calorie malnutrition (HCC) 03/03/2018  . CAP (community acquired pneumonia) 02/28/2018  . Dyspnea 02/28/2018  . Anxiety 02/28/2018  . COPD with acute exacerbation (HCC) 02/28/2018  . CHF (congestive heart failure) (HCC)   . Generalized weakness 11/05/2011  . Anemia 11/05/2011  . COPD (chronic obstructive pulmonary disease) (HCC) 11/05/2011  . Dysphagia 08/20/2011  . Abnormal CT scan, chest 08/17/2011    Orientation RESPIRATION BLADDER Height & Weight     Self, Time, Situation, Place  O2(Nasal cannula 1L) Incontinent, External catheter Weight: 77.1 kg Height:  5\' 5"  (165.1 cm)  BEHAVIORAL SYMPTOMS/MOOD NEUROLOGICAL BOWEL NUTRITION STATUS  (None)   Continent Diet(Please see DC Summary)  AMBULATORY STATUS COMMUNICATION OF NEEDS Skin   Limited Assist Verbally Normal                       Personal Care Assistance Level of Assistance  Bathing, Feeding, Dressing Bathing Assistance: Limited assistance Feeding assistance: Limited assistance Dressing Assistance: Limited assistance     Functional Limitations Info  Sight, Speech, Hearing Sight Info: Impaired Hearing Info: Impaired Speech Info: Adequate    SPECIAL CARE  FACTORS FREQUENCY  PT (By licensed PT), OT (By licensed OT)     PT Frequency: 5x/week OT Frequency: 3x/week            Contractures Contractures Info: Not present    Additional Factors Info  Code Status, Allergies, Psychotropic Code Status Info: Full Allergies Info: Codeine Psychotropic Info: Prozac; Klonopin         Current Medications (03/04/2018):  This is the current hospital active medication list Current Facility-Administered Medications  Medication Dose Route Frequency Provider Last Rate Last Dose  . albuterol (PROVENTIL) (2.5 MG/3ML) 0.083% nebulizer solution 2.5 mg  2.5 mg Nebulization Q4H PRN Levie Heritage, DO   2.5 mg at 03/03/18 0609  . aspirin EC tablet 81 mg  81 mg Oral Daily Levie Heritage, DO   81 mg at 03/04/18 1000  . azithromycin (ZITHROMAX) tablet 500 mg  500 mg Oral Q24H Levie Heritage, DO   500 mg at 03/03/18 2354  . cefTRIAXone (ROCEPHIN) 1 g in sodium chloride 0.9 % 100 mL IVPB  1 g Intravenous Q24H Levie Heritage, DO   Stopped at 03/03/18 2357  . clonazePAM (KLONOPIN) tablet 1 mg  1 mg Oral BID Levie Heritage, DO   1 mg at 03/04/18 1000  . dextromethorphan-guaiFENesin (MUCINEX DM) 30-600 MG per 12 hr tablet 1 tablet  1 tablet Oral BID Lahoma Crocker, MD   1 tablet at 03/04/18 1000  . enoxaparin (LOVENOX) injection 40 mg  40 mg Subcutaneous Q24H Levie Heritage, DO   40 mg at 03/03/18 1729  .  feeding supplement (ENSURE ENLIVE) (ENSURE ENLIVE) liquid 237 mL  237 mL Oral BID BM Levie HeritageStinson, Jacob J, DO   237 mL at 03/04/18 1000  . FLUoxetine (PROZAC) capsule 20 mg  20 mg Oral Daily Levie HeritageStinson, Jacob J, DO   20 mg at 03/04/18 1000  . MEDLINE mouth rinse  15 mL Mouth Rinse BID Lahoma CrockerSheehan, Theresa C, MD   15 mL at 03/04/18 1004  . mometasone-formoterol (DULERA) 200-5 MCG/ACT inhaler 2 puff  2 puff Inhalation BID Levie HeritageStinson, Jacob J, DO   2 puff at 03/04/18 [id number]  . potassium chloride SA (K-DUR,KLOR-CON) CR tablet 20 mEq  20 mEq Oral Daily Levie HeritageStinson, Jacob J, DO    20 mEq at 03/04/18 1000  . predniSONE (DELTASONE) tablet 40 mg  40 mg Oral Q breakfast Levie HeritageStinson, Jacob J, DO   40 mg at 03/04/18 1000     Discharge Medications: Please see discharge summary for a list of discharge medications.  Relevant Imaging Results:  Relevant Lab Results:   Additional Information SSN: [id number]  Sabrena Gavitt S [place], [place]

## 2018-03-04 NOTE — Progress Notes (Signed)
Occupational Therapy Treatment Patient Details Name: [name] [name] MRN: [id number] DOB: [birth year] Today's Date: 03/04/2018    History of present illness Pt is a 67 y/o female who presents with SOB and decreasing tolerance for functional activity. X-ray revealed PNA. PMH significant for HTN, hiatal hernia, dizziness, depression, degenerative disorder of R eye, COPD, CHF, acute renal failure.    OT comments  Pt seen for OT ADL retraining session with focus on bed mobility, sit to stand transfers and SPT x2 from EOB to chair with +1 HHA (simulated toilet transfer as pt declined use of 3:1). Pt presents with generalized weakness and fatigues easily, should benefit from cont acute OT prior to d/c to SNF.   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 Pt reporting she can see shadows and was able to point out objects ~8 feet in front of her in room. Difficulty seeing details       Mobility Bed Mobility Overal bed mobility: Needs Assistance Bed Mobility: Supine to Sit     Supine to sit: Min guard     General bed mobility comments: VC's for sequencing and safety.   Transfers Overall transfer level: Needs assistance Equipment used: 1 person hand held assist Transfers: Sit to/from [employer] Sit to Stand: Min assist Stand pivot transfers: Min assist       General transfer comment: Min assist for power-up to full stand, min assist to pivot x2. Pt declined use of RW this session.    Balance Overall balance assessment: Needs assistance Sitting-balance support: Feet supported;Single extremity supported Sitting balance-Leahy Scale: Fair       Standing balance-Leahy Scale: Poor Standing balance  comment: relies on BUE support and assistance for brief stand pivot transfers, does not stand statically more than briefly secondary to anxiety and generalized weakness                           ADL either performed or assessed with clinical judgement   ADL Overall ADL's : Needs assistance/impaired Eating/Feeding: Set up;Sitting   Grooming: Set up;Sitting                   Toilet Transfer: Minimal assistance;Stand-pivot;BSC(Simulated bed to chair (pt declined toileting)) Toilet Transfer Details (indicate cue type and reason): +1 hand held assist, pt has decreased activity tolerance. Sit to stand for SPT x2 from EOB to recliner. Pt reports fatigue during second trial.           General ADL Comments: Pt seen for OT ADL retraining session with focus on bed mobility, sit to stand transfers and SPT x2 from EOB to chair with +1 HHA (simulated toilet transfer as pt declined use of 3:1). Pt presents with generalized weakness and fatigues easily, should benefit from cont acute OT prior to d/c to SNF.     Vision Baseline Vision/History: Legally blind Patient Visual Report: (Blind R Eye, nearly blind in L eye) Additional Comments: Pt with significant visual deficits at baseline noted.   Perception     Praxis      Cognition Arousal/Alertness: Awake/alert Behavior During Therapy: WFL for tasks assessed/performed;Anxious  Memory: Decreased short-term memory Following Commands: Follows one step commands consistently;Follows one step commands with increased time;Follows multi-step commands inconsistently Safety/Judgement: Decreased awareness of safety   Problem Solving: Slow processing;Decreased initiation;Difficulty sequencing;Requires verbal cues          Exercises     Shoulder Instructions       General Comments      Pertinent Vitals/ Pain       Pain Assessment: No/denies pain  Home Living                                           Prior Functioning/Environment              Frequency  Min 2X/week        Progress Toward Goals  OT Goals(current goals can now be found in the care plan section)  Progress towards OT goals: Progressing toward goals  Acute Rehab OT Goals Patient Stated Goal: Not stated  Plan Discharge plan remains appropriate    Co-evaluation                 AM-PAC OT "6 Clicks" Daily Activity     Outcome Measure   Help from another person eating meals?: None Help from another person taking care of personal grooming?: None Help from another person toileting, which includes using toliet, bedpan, or urinal?: A Lot Help from another person bathing (including washing, rinsing, drying)?: A Lot Help from another person to put on and taking off regular upper body clothing?: A Little Help from another person to put on and taking off regular lower body clothing?: A Lot 6 Click Score: 17    End of Session Equipment Utilized During Treatment: Gait belt;Oxygen  OT Visit Diagnosis: Other abnormalities of gait and mobility (R26.89);Low vision, both eyes (H54.2)   Activity Tolerance Patient tolerated treatment well   Patient Left in chair;with call bell/phone within reach;with chair alarm set;with nursing/sitter in room   Nurse Communication Mobility status;Other (comment)(Pt requested coffee)        Time: [id number] OT Time Calculation (min): 18 min  Charges: OT General Charges $OT Visit: 1 Visit OT Treatments $Self Care/Home Management : 8-22 mins    Barnhill, Amy Beth Dixon, OTR/L 03/04/2018, 11:31 AM

## 2018-03-05 LAB — BASIC METABOLIC PANEL
Anion gap: 5 (ref 5–15)
BUN: 25 mg/dL — ABNORMAL HIGH (ref 8–23)
CO2: 25 mmol/L (ref 22–32)
Calcium: 8.2 mg/dL — ABNORMAL LOW (ref 8.9–10.3)
Chloride: 103 mmol/L (ref 98–111)
Creatinine, Ser: 1.18 mg/dL — ABNORMAL HIGH (ref 0.44–1.00)
GFR calc Af Amer: 54 mL/min — ABNORMAL LOW (ref >60)
GFR calc non Af Amer: 47 mL/min — ABNORMAL LOW (ref >60)
Glucose, Bld: 125 mg/dL — ABNORMAL HIGH (ref 70–99)
Potassium: 4.8 mmol/L (ref 3.5–5.1)
SODIUM: 133 mmol/L — AB (ref 135–145)

## 2018-03-05 LAB — CBC
HCT: 28.7 % — ABNORMAL LOW (ref 36.0–46.0)
Hemoglobin: 8.9 g/dL — ABNORMAL LOW (ref 12.0–15.0)
MCH: 32.6 pg (ref 26.0–34.0)
MCHC: 31 g/dL (ref 30.0–36.0)
MCV: 105.1 fL — ABNORMAL HIGH (ref 80.0–100.0)
Platelets: 125 10*3/uL — ABNORMAL LOW (ref 150–400)
RBC: 2.73 MIL/uL — ABNORMAL LOW (ref 3.87–5.11)
RDW: 13.9 % (ref 11.5–15.5)
WBC: 5.4 10*3/uL (ref 4.0–10.5)
nRBC: 0 % (ref 0.0–0.2)

## 2018-03-05 LAB — CULTURE, BLOOD (ROUTINE X 2)
Culture: NO GROWTH
Culture: NO GROWTH
Special Requests: ADEQUATE
Special Requests: ADEQUATE

## 2018-03-05 MED ORDER — SODIUM POLYSTYRENE SULFONATE 15 GM/60ML PO SUSP
15.0000 g | Freq: Once | ORAL | Status: AC
  Administered 2018-03-05: 15 g via ORAL
  Filled 2018-03-05: qty 60

## 2018-03-05 NOTE — Progress Notes (Signed)
Nutrition Follow-up  DOCUMENTATION CODES:   Non-severe (moderate) malnutrition in context of social or environmental circumstances  INTERVENTION:   - Continue Ensure Enlive po BID, each supplement provides 350 kcal and 20 grams of protein (chocolate or strawberry flavors)  - Downgrade diet to Dysphagia 3 for ease of chewing  NUTRITION DIAGNOSIS:   Moderate Malnutrition related to social / environmental circumstances (food insecurity) as evidenced by mild muscle depletion, moderate muscle depletion, mild fat depletion, severe fat depletion, energy intake < 75% for > or equal to 3 months.  New diagnosis  GOAL:   Patient will meet greater than or equal to 90% of their needs  Progressing  MONITOR:   PO intake, Supplement acceptance, Skin, Labs, Weight trends  REASON FOR ASSESSMENT:   Consult Assessment of nutrition requirement/status  ASSESSMENT:   67 yo Female with PMH of CHF, COPD, anemia, anxiety, HTN, GERD; pt with cough and general malaise for several weeks; told by PCP to come to ED for concern for PNA.  Spoke with pt at bedside who reports good appetite during hospital admission. Pt states that PTA, her appetite was poor for 3-4 weeks and she was experiencing N/V after eating. Pt shares that this has now resolved.  Pt reports that she gets her food from her son. Pt states that she may eat just 1 meal a day or may eat 3 depending on what her son gives her. Pt typically eats breakfast which may include a biscuit from [employer]. If pt eats lunch, it may include toast with pimento cheese (pt reports that this is too difficult to chew at times) or tomato or chicken noodle soup. Dinner is similar to lunch. Pt states that she drinks 2 Ensure supplements daily.  Pt shares that she has no upper teeth which often makes chewing difficult. Pt states that she tried to eat her bread with jelly this morning but couldn't due to hard texture. Pt amenable to RD downgrading diet to  Dysphagia 3 for ease of chewing. Pt does report eating all of her eggs and chewing and spitting out her bacon this morning.  Pt shares that she weighed 363 lbs 3 years ago and has been steadily losing weight since that time. Pt reports her current weight as 168 lbs. Weight history in chart is limited and shows the same weight for the past 3 encounters. Suspect this weight was stated rather than measured.  RD completed NFPE as RD who completed initial assessment was unable to do so. Given findings from exam, pt now meets criteria for moderate malnutrition. Pt states, "I have malnutrition because I don't have enough to eat."  Given inconsistent weight history and lack of measured weights in chart, pt only meets criteria for moderate malnutrition. Suspect pt with severe malnutrition but unable to confirm at this time.  Meal Completion: 75-100% x last 5 meals  Medications reviewed and include: Ensure Enlive BID (pt accepting >75%), K-dur 20 mEq daily, IV antibiotics  Labs reviewed: sodium 133 (L), BUN 25 (H), creatinine 1.18 (H), hemoglobin 8.9 (L)  NUTRITION - FOCUSED PHYSICAL EXAM:    Most Recent Value  Orbital Region  Severe depletion  Upper Arm Region  Mild depletion  Thoracic and Lumbar Region  Mild depletion  Buccal Region  Mild depletion  Temple Region  Moderate depletion  Clavicle Bone Region  Moderate depletion  Clavicle and Acromion Bone Region  Moderate depletion  Scapular Bone Region  Moderate depletion  Dorsal Hand  Mild depletion  Patellar Region  Mild depletion  Anterior Thigh Region  Mild depletion  Posterior Calf Region  Moderate depletion  Edema (RD Assessment)  Mild [BUE, LLE]  Hair  Reviewed [brittle, dry, falling out easily per pt]  Eyes  Reviewed  Mouth  Reviewed [no upper teeth noted]  Skin  Reviewed [large patches of ecchymosis]  Nails  Reviewed       Diet Order:   Diet Order            DIET DYS 3 Room service appropriate? Yes; Fluid consistency: Thin   Diet effective now              EDUCATION NEEDS:   Education needs have been addressed  Skin:  Skin Assessment: Reviewed RN Assessment  Last BM:  12/16  Height:   Ht Readings from Last 1 Encounters:  02/28/18 5\' 5"  (1.651 m)    Weight:   Wt Readings from Last 1 Encounters:  02/28/18 77.1 kg    Ideal Body Weight:  56.8 kg  BMI:  Body mass index is 28.29 kg/m.  Estimated Nutritional Needs:   Kcal:  1800-2000  Protein:  85-100 gm  Fluid:  1.8-2.0 L    Earma ReadingKate Jablonski Deshan Hemmelgarn, MS, RD, LDN Inpatient Clinical Dietitian Pager: [phone] Weekend/After Hours: [phone]

## 2018-03-05 NOTE — Progress Notes (Signed)
CSW spoke with patient. She is in agreement to discharge to [facility] tomorrow for short term rehab. She again expressed her fear of returning to her house with her son. [facility]. Patient reported that her sister-in-law [place] so she can come see her.    Osborne Cascoadia Finlee Milo LCSW [phone]

## 2018-03-05 NOTE — Progress Notes (Signed)
Pasrr: [phone] Fredrich Birks   Areliz Rothman LCSW [phone]

## 2018-03-05 NOTE — Progress Notes (Addendum)
PROGRESS NOTE  [name] [name] ZOX:[id number] DOB: 02/22/1951 DOA: 02/28/2018 PCP: Ronal Fear, NP   LOS: 2 days   Brief Narrative / Interim history: 67 year old female with history of COPD, chronic diastolic CHF, anxiety, who was admitted to the hospital on 02/28/2018 with shortness of breath progressive over the last week.  She was found herself to be significantly more and more dyspneic and decided to come to the ED.  She was also complaining of nonproductive cough with wheezing, her home inhalers have not helped.  Subjective: -Reports she is feeling better today, not back at baseline, still some dyspnea, reports cough, nonproductive, denies any fever or chills .Marland Kitchen  Assessment & Plan: Principal Problem:   CAP (community acquired pneumonia) Active Problems:   Dyspnea   Anxiety   COPD with acute exacerbation (HCC)   CHF (congestive heart failure) (HCC)   Severe protein-calorie malnutrition (HCC)    Community-acquired pneumonia with acute hypoxic respiratory failure -Continue antibiotics with ceftriaxone and azithromycin, still with some dyspnea, reports it is improving, still requiring oxygen, wean as tolerated   COPD exacerbation -Wheezing has improved, continue prednisone, nebulizers as needed, dyspnea has improved wean off oxygen as tolerated  Anxiety -Continue home regimen with clonazepam  Chronic diastolic CHF -She appears euvolemic  Anemia of chronic disease -Hemoglobin overall stable, slightly downtrending but will continue to monitor.  No bleeding from  Thrombocytopenia -Chronic, stable  Moderate protein calorie malnutrition -Nutritionist consulted  Scheduled Meds: . aspirin EC  81 mg Oral Daily  . azithromycin  500 mg Oral Q24H  . clonazePAM  1 mg Oral BID  . dextromethorphan-guaiFENesin  1 tablet Oral BID  . enoxaparin (LOVENOX) injection  40 mg Subcutaneous Q24H  . feeding supplement (ENSURE ENLIVE)  237 mL Oral BID BM  . FLUoxetine  20 mg Oral Daily  .  mouth rinse  15 mL Mouth Rinse BID  . mometasone-formoterol  2 puff Inhalation BID  . potassium chloride SA  20 mEq Oral Daily  . predniSONE  40 mg Oral Q breakfast   Continuous Infusions: . cefTRIAXone (ROCEPHIN)  IV Stopped (03/05/18 0005)  . sodium chloride Stopped (03/05/18 0003)   PRN Meds:.albuterol  DVT prophylaxis: Lovenox Code Status: Full code Family Communication: No family at bedside Disposition Plan: SNF when improved  Consultants:   None  Procedures:   none  Antimicrobials:  Ceftrixone 12/13>>  Azithromycin 12/13 >>   Objective: Vitals:   03/04/18 2256 03/05/18 0450 03/05/18 0829 03/05/18 1327  BP: (!) 98/51 (!) 124/49  116/62  Pulse: 70 68  82  Resp:  19  17  Temp: 97.6 F (36.4 C) 97.6 F (36.4 C)  98.2 F (36.8 C)  TempSrc: Oral Oral  Oral  SpO2:  100% 99% 100%  Weight:      Height:        Intake/Output Summary (Last 24 hours) at 03/05/2018 1408 Last data filed at 03/05/2018 1341 Gross per 24 hour  Intake 580 ml  Output -  Net 580 ml   Filed Weights   02/28/18 1130  Weight: 77.1 kg    Examination:  Awake Alert, Oriented X 3, frail, weak, chronically ill-appearing, no new F.N deficits, Normal affect Symmetrical Chest wall movement, mildly diminished air entry, with end expiratory wheezing RRR,No Gallops,Rubs or new Murmurs, No Parasternal Heave +ve B.Sounds, Abd Soft, No tenderness, No rebound - guarding or rigidity. No Cyanosis, Clubbing or edema, No new Rash or bruise       Data Reviewed: I have  independently reviewed following labs and imaging studies   CBC: Recent Labs  Lab 02/28/18 1344 03/01/18 0533 03/02/18 0847 03/05/18 0336  WBC 6.3 3.6* 9.3 5.4  NEUTROABS 4.5  --  7.6  --   HGB 11.2* 9.1* 8.7* 8.9*  HCT 37.4 29.5* 28.5* 28.7*  MCV 104.8* 104.2* 105.6* 105.1*  PLT 145* 116* 115* 125*   Basic Metabolic Panel: Recent Labs  Lab 02/28/18 1344 03/01/18 0533 03/02/18 0847 03/05/18 0336  NA 138 137 135 133*    K 3.9 4.4 4.3 4.8  CL 104 106 105 103  CO2 28 27 25 25   GLUCOSE 85 219* 108* 125*  BUN 13 18 21  25*  CREATININE 0.95 1.00 0.94 1.18*  CALCIUM 8.1* 7.8* 8.0* 8.2*  MG  --   --  1.8  --    GFR: Estimated Creatinine Clearance: 46.2 mL/min (A) (by C-G formula based on SCr of 1.18 mg/dL (H)). Liver Function Tests: No results for input(s): AST, ALT, ALKPHOS, BILITOT, PROT, ALBUMIN in the last 168 hours. No results for input(s): LIPASE, AMYLASE in the last 168 hours. No results for input(s): AMMONIA in the last 168 hours. Coagulation Profile: No results for input(s): INR, PROTIME in the last 168 hours. Cardiac Enzymes: Recent Labs  Lab 02/28/18 1344  TROPONINI <0.03   BNP (last 3 results) No results for input(s): PROBNP in the last 8760 hours. HbA1C: No results for input(s): HGBA1C in the last 72 hours. CBG: No results for input(s): GLUCAP in the last 168 hours. Lipid Profile: No results for input(s): CHOL, HDL, LDLCALC, TRIG, CHOLHDL, LDLDIRECT in the last 72 hours. Thyroid Function Tests: No results for input(s): TSH, T4TOTAL, FREET4, T3FREE, THYROIDAB in the last 72 hours. Anemia Panel: No results for input(s): VITAMINB12, FOLATE, FERRITIN, TIBC, IRON, RETICCTPCT in the last 72 hours. Urine analysis:    Component Value Date/Time   COLORURINE AMBER (A) 03/01/2018 2309   APPEARANCEUR TURBID (A) 03/01/2018 2309   LABSPEC 1.045 (H) 03/01/2018 2309   PHURINE 5.0 03/01/2018 2309   GLUCOSEU NEGATIVE 03/01/2018 2309   HGBUR NEGATIVE 03/01/2018 2309   BILIRUBINUR SMALL (A) 03/01/2018 2309   KETONESUR NEGATIVE 03/01/2018 2309   PROTEINUR 30 (A) 03/01/2018 2309   UROBILINOGEN 0.2 11/06/2011 0223   NITRITE NEGATIVE 03/01/2018 2309   LEUKOCYTESUR NEGATIVE 03/01/2018 2309   Sepsis Labs: Invalid input(s): PROCALCITONIN, LACTICIDVEN  Recent Results (from the past 240 hour(s))  Culture, blood (routine x 2) Call MD if unable to obtain prior to antibiotics being given     Status:  None   Collection Time: 02/28/18  6:45 PM  Result Value Ref Range Status   Specimen Description BLOOD RIGHT ANTECUBITAL  Final   Special Requests   Final    BOTTLES DRAWN AEROBIC ONLY Blood Culture adequate volume   Culture   Final    NO GROWTH 5 DAYS Performed at [facility], [address] [place], [place] [address]    Report Status 03/05/2018 FINAL  Final  Culture, blood (routine x 2) Call MD if unable to obtain prior to antibiotics being given     Status: None   Collection Time: 02/28/18  7:03 PM  Result Value Ref Range Status   Specimen Description BLOOD RIGHT ANTECUBITAL  Final   Special Requests   Final    BOTTLES DRAWN AEROBIC ONLY Blood Culture adequate volume   Culture   Final    NO GROWTH 5 DAYS Performed at [facility], [address]  [place], [place] [address]    Report Status 03/05/2018 FINAL  Final      Radiology Studies: No results found.   Huey Bienenstock MD [facility] Pager [phone]  If 7PM-7AM, please contact night-coverage [email or web address] Password [email or web address] 03/05/2018, 2:08 PM

## 2018-03-05 NOTE — Progress Notes (Signed)
Patient's sister-in-law, Talbert ForestShirley, contacted CSW back and requested [facility]. If they do not have availability, she will go with [facility].  Osborne Cascoadia Sativa Gelles LCSW [phone]

## 2018-03-06 DIAGNOSIS — D7589 Other specified diseases of blood and blood-forming organs: Secondary | ICD-10-CM | POA: Diagnosis not present

## 2018-03-06 DIAGNOSIS — I5033 Acute on chronic diastolic (congestive) heart failure: Secondary | ICD-10-CM | POA: Diagnosis not present

## 2018-03-06 DIAGNOSIS — Z741 Need for assistance with personal care: Secondary | ICD-10-CM | POA: Diagnosis not present

## 2018-03-06 DIAGNOSIS — R0600 Dyspnea, unspecified: Secondary | ICD-10-CM | POA: Diagnosis not present

## 2018-03-06 DIAGNOSIS — J449 Chronic obstructive pulmonary disease, unspecified: Secondary | ICD-10-CM | POA: Diagnosis not present

## 2018-03-06 DIAGNOSIS — R2689 Other abnormalities of gait and mobility: Secondary | ICD-10-CM | POA: Diagnosis not present

## 2018-03-06 DIAGNOSIS — J441 Chronic obstructive pulmonary disease with (acute) exacerbation: Secondary | ICD-10-CM | POA: Diagnosis not present

## 2018-03-06 DIAGNOSIS — J189 Pneumonia, unspecified organism: Secondary | ICD-10-CM | POA: Diagnosis not present

## 2018-03-06 DIAGNOSIS — E440 Moderate protein-calorie malnutrition: Secondary | ICD-10-CM

## 2018-03-06 DIAGNOSIS — R609 Edema, unspecified: Secondary | ICD-10-CM | POA: Diagnosis not present

## 2018-03-06 DIAGNOSIS — Z7401 Bed confinement status: Secondary | ICD-10-CM | POA: Diagnosis not present

## 2018-03-06 DIAGNOSIS — E43 Unspecified severe protein-calorie malnutrition: Secondary | ICD-10-CM | POA: Diagnosis not present

## 2018-03-06 DIAGNOSIS — K5900 Constipation, unspecified: Secondary | ICD-10-CM | POA: Diagnosis not present

## 2018-03-06 DIAGNOSIS — D649 Anemia, unspecified: Secondary | ICD-10-CM | POA: Diagnosis not present

## 2018-03-06 DIAGNOSIS — D72829 Elevated white blood cell count, unspecified: Secondary | ICD-10-CM | POA: Diagnosis not present

## 2018-03-06 DIAGNOSIS — K219 Gastro-esophageal reflux disease without esophagitis: Secondary | ICD-10-CM | POA: Diagnosis not present

## 2018-03-06 DIAGNOSIS — I110 Hypertensive heart disease with heart failure: Secondary | ICD-10-CM | POA: Diagnosis not present

## 2018-03-06 DIAGNOSIS — R1312 Dysphagia, oropharyngeal phase: Secondary | ICD-10-CM | POA: Diagnosis not present

## 2018-03-06 DIAGNOSIS — J181 Lobar pneumonia, unspecified organism: Secondary | ICD-10-CM | POA: Diagnosis not present

## 2018-03-06 DIAGNOSIS — R41841 Cognitive communication deficit: Secondary | ICD-10-CM | POA: Diagnosis not present

## 2018-03-06 DIAGNOSIS — M6281 Muscle weakness (generalized): Secondary | ICD-10-CM | POA: Diagnosis not present

## 2018-03-06 DIAGNOSIS — N179 Acute kidney failure, unspecified: Secondary | ICD-10-CM | POA: Diagnosis not present

## 2018-03-06 DIAGNOSIS — M2550 Pain in unspecified joint: Secondary | ICD-10-CM | POA: Diagnosis not present

## 2018-03-06 MED ORDER — IPRATROPIUM-ALBUTEROL 0.5-2.5 (3) MG/3ML IN SOLN: 3.0000 mL | Freq: Four times a day (QID) | RESPIRATORY_TRACT | Status: AC

## 2018-03-06 MED ORDER — ALBUTEROL SULFATE (2.5 MG/3ML) 0.083% IN NEBU: 2.5000 mg | INHALATION_SOLUTION | RESPIRATORY_TRACT | 12 refills | Status: AC | PRN

## 2018-03-06 MED ORDER — CLONAZEPAM 1 MG PO TABS: 1.0000 mg | ORAL_TABLET | Freq: Two times a day (BID) | ORAL | 0 refills | Status: AC

## 2018-03-06 MED ORDER — CEFPODOXIME PROXETIL 200 MG PO TABS: 200.0000 mg | ORAL_TABLET | Freq: Two times a day (BID) | ORAL | Status: AC

## 2018-03-06 MED ORDER — IPRATROPIUM-ALBUTEROL 0.5-2.5 (3) MG/3ML IN SOLN: 3.0000 mL | Freq: Four times a day (QID) | RESPIRATORY_TRACT | Status: DC

## 2018-03-06 MED ORDER — ENSURE ENLIVE PO LIQD: 237.0000 mL | Freq: Two times a day (BID) | ORAL | 12 refills | Status: AC

## 2018-03-06 MED ORDER — PANTOPRAZOLE SODIUM 40 MG PO TBEC: 40.0000 mg | DELAYED_RELEASE_TABLET | Freq: Every day | ORAL | Status: AC

## 2018-03-06 MED ORDER — PREDNISONE 10 MG PO TABS: ORAL_TABLET | ORAL | 0 refills | Status: AC

## 2018-03-06 MED ORDER — AZITHROMYCIN 500 MG PO TABS: 500.0000 mg | ORAL_TABLET | ORAL | 0 refills | Status: AC

## 2018-03-06 NOTE — Discharge Instructions (Signed)
Follow with Primary MD Shayne AlkenLam, Tawny AsalLynn E, NP or SNF physician  Get CBC, CMP, 2 view Chest X ray checked  by Primary MD next visit.    Activity: As tolerated with Full fall precautions use walker/cane & assistance as needed   Disposition SNF   Diet: ysphagia 3 with thin liquid heart Healthy  , with feeding assistance and aspiration precautions.  For Heart failure patients - Check your Weight same time everyday, if you gain over 2 pounds, or you develop in leg swelling, experience more shortness of breath or chest pain, call your Primary MD immediately. Follow Cardiac Low Salt Diet and 1.5 lit/day fluid restriction.   On your next visit with your primary care physician please Get Medicines reviewed and adjusted.   Please request your Prim.MD to go over all Hospital Tests and Procedure/Radiological results at the follow up, please get all Hospital records sent to your Prim MD by signing hospital release before you go home.   If you experience worsening of your admission symptoms, develop shortness of breath, life threatening emergency, suicidal or homicidal thoughts you must seek medical attention immediately by calling 911 or calling your MD immediately  if symptoms less severe.  You Must read complete instructions/literature along with all the possible adverse reactions/side effects for all the Medicines you take and that have been prescribed to you. Take any new Medicines after you have completely understood and accpet all the possible adverse reactions/side effects.   Do not drive, operating heavy machinery, perform activities at heights, swimming or participation in water activities or provide baby sitting services if your were admitted for syncope or siezures until you have seen by Primary MD or a Neurologist and advised to do so again.  Do not drive when taking Pain medications.    Do not take more than prescribed Pain, Sleep and Anxiety Medications  Special Instructions: If you have  smoked or chewed Tobacco  in the last 2 yrs please stop smoking, stop any regular Alcohol  and or any Recreational drug use.  Wear Seat belts while driving.   Please note  You were cared for by a hospitalist during your hospital stay. If you have any questions about your discharge medications or the care you received while you were in the hospital after you are discharged, you can call the unit and asked to speak with the hospitalist on call if the hospitalist that took care of you is not available. Once you are discharged, your primary care physician will handle any further medical issues. Please note that NO REFILLS for any discharge medications will be authorized once you are discharged, as it is imperative that you return to your primary care physician (or establish a relationship with a primary care physician if you do not have one) for your aftercare needs so that they can reassess your need for medications and monitor your lab values.

## 2018-03-06 NOTE — Care Management Important Message (Signed)
Important Message  Patient Details  Name: [name] MRN: [id number] Date of Birth: 1951/01/25   Medicare Important Message Given:  Yes    [name] [name] 03/06/2018, 3:36 PM

## 2018-03-06 NOTE — Progress Notes (Signed)
Patient will DC to: [facility] Anticipated DC date: 03/06/18 Family notified: Son at bedside Transport by: [facility] 1pm   Per MD patient [facility]. RN, patient, patient's family, and facility notified of DC. Discharge Summary and FL2 sent to facility. RN to call report prior to discharge [phone]). DC packet on chart. Ambulance transport requested for patient.   CSW will sign off for now as social work intervention is no longer needed. Please consult us again if new needs arise.  Cristobal GoldmannNadia Lorana Maffeo, LCSW Clinical Social Worker [phone]

## 2018-03-06 NOTE — Progress Notes (Signed)
Nsg Discharge Note  Admit Date:  02/28/2018 Discharge date: 03/06/2018   [name] to be D/C'd Skilled nursing facility per MD order.  AVS completed. Discharge Medication: Allergies as of 03/06/2018      Reactions   Codeine Other (See Comments)   Feel Numb.       Medication List    STOP taking these medications   albuterol 108 (90 Base) MCG/ACT inhaler Commonly known as:  PROVENTIL HFA;VENTOLIN HFA Replaced by:  albuterol (2.5 MG/3ML) 0.083% nebulizer solution     TAKE these medications   albuterol (2.5 MG/3ML) 0.083% nebulizer solution Commonly known as:  PROVENTIL Take 3 mLs (2.5 mg total) by nebulization every 4 (four) hours as needed for wheezing or shortness of breath. Replaces:  albuterol 108 (90 Base) MCG/ACT inhaler   aspirin 81 MG tablet Take 81 mg by mouth daily.   azithromycin 500 MG tablet Commonly known as:  ZITHROMAX Take 1 tablet (500 mg total) by mouth daily for 2 days. Take for 2 days then stop Start taking on:  March 07, 2018   calcium-vitamin D 250-125 MG-UNIT tablet Commonly known as:  OSCAL Take 1 tablet by mouth daily.   cefpodoxime 200 MG tablet Commonly known as:  VANTIN Take 1 tablet (200 mg total) by mouth 2 (two) times daily for 2 days. Please take for 2 days then stop   cholecalciferol 1000 units tablet Commonly known as:  VITAMIN D Take 1,000 Units by mouth daily.   clonazePAM 1 MG tablet Commonly known as:  KLONOPIN Take 1 tablet (1 mg total) by mouth 2 (two) times daily.   CoQ10 200 MG Caps Take 1 capsule by mouth daily.   feeding supplement (ENSURE ENLIVE) Liqd Take 237 mLs by mouth 2 (two) times daily between meals.   FLUoxetine 20 MG capsule Commonly known as:  PROZAC Take 20 mg by mouth daily.   Fluticasone-Salmeterol 250-50 MCG/DOSE Aepb Commonly known as:  ADVAIR Inhale 1 puff into the lungs 2 (two) times daily.   furosemide 40 MG tablet Commonly known as:  LASIX Take 40 mg by mouth daily as needed for  fluid.   FUSION PLUS PO Take 1 capsule by mouth daily.   ipratropium-albuterol 0.5-2.5 (3) MG/3ML Soln Commonly known as:  DUONEB Take 3 mLs by nebulization every 6 (six) hours.   lidocaine 5 % Commonly known as:  LIDODERM Place 1 patch onto the skin daily. Remove & Discard patch within 12 hours or as directed by MD   multivitamin with minerals Tabs tablet Take 1 tablet by mouth daily.   pantoprazole 40 MG tablet Commonly known as:  PROTONIX Take 1 tablet (40 mg total) by mouth daily.   potassium chloride SA 20 MEQ tablet Commonly known as:  K-DUR,KLOR-CON Take 20 mEq by mouth daily.   predniSONE 10 MG tablet Commonly known as:  DELTASONE 40 mg oral daily x2 days, then 30 mg oral daily x2 days, then 20 mg oral daily x2 days, then 10 mg oral daily x2 days, then stop       Discharge Assessment: Vitals:   03/06/18 0530 03/06/18 0818  BP: (!) 113/56   Pulse: 67   Resp: 18   Temp: 97.7 F (36.5 C)   SpO2: 100% 99%   Skin clean, dry and intact without evidence of skin break down, no evidence of skin tears noted. IV catheter discontinued intact. Site without signs and symptoms of complications - no redness or edema noted at insertion site, patient denies c/o  pain - only slight tenderness at site.  Dressing with slight pressure applied.  D/c Instructions-Education: Discharge instructions given to patient/family with verbalized understanding. D/c education completed with patient/family including follow up instructions, medication list, d/c activities limitations if indicated, with other d/c instructions as indicated by MD - patient able to verbalize understanding, all questions fully answered. Patient instructed to return to ED, call 911, or call MD for any changes in condition.    [name] N Skii Cleland, RN 03/06/2018 2:22 PM

## 2018-03-06 NOTE — Clinical Social Work Placement (Signed)
   CLINICAL SOCIAL WORK PLACEMENT  NOTE  Date:  03/06/2018  Patient Details  Name: [name] FallJudy [name] MRN: [id number] Date of Birth: May 02, 1950  Clinical Social Work is seeking post-discharge placement for this patient at the Skilled  Nursing Facility level of care (*CSW will initial, date and re-position this form in  chart as items are completed):  Yes   Patient/family provided with [facility] Clinical Social Work Department's list of facilities offering this level of care within the geographic area requested by the patient (or if unable, by the patient's family).  Yes   Patient/family informed of their freedom to choose among providers that offer the needed level of care, that participate in Medicare, Medicaid or managed care program needed by the patient, have an available bed and are willing to accept the patient.  Yes   Patient/family informed of [facility]'s ownership interest in [facility] and [facility], as well as of the fact that they are under no obligation to receive care at these facilities.  PASRR submitted to EDS on 03/04/18     PASRR number received on 03/04/18     Existing PASRR number confirmed on       FL2 transmitted to all facilities in geographic area requested by pt/family on 03/05/18     FL2 transmitted to all facilities within larger geographic area on       Patient informed that his/her managed care company has contracts with or will negotiate with certain facilities, including the following:        Yes   Patient/family informed of bed offers received.  Patient [facility] bed at [facility]     Physician recommends and patient chooses bed at      Patient to be transferred to [facility] on 03/06/18.  Patient to be transferred to facility by [facility]     Patient family notified on 03/06/18 of transfer.  Name of family member notified:  [name]     PHYSICIAN       Additional Comment:     _______________________________________________ Mearl LatinNadia S Jaci Desanto, LCSW 03/06/2018, 12:01 PM

## 2018-03-06 NOTE — Discharge Summary (Signed)
[name] [name], is a 67 y.o. female  DOB 03/31/1950  MRN [id number].  Admission date:  02/28/2018  Admitting Physician  Lahoma Crocker, MD  Discharge Date:  03/06/2018   Primary MD  Ronal Fear, NP  Recommendations for primary care physician for things to follow:  - please check CBC, BMP in 3 days. - please repeat two-view chest x-ray in 3 to 4 weeks -Wean oxygen as tolerated  Admission Diagnosis  Community acquired pneumonia of left lung, unspecified part of lung [J18.9]   Discharge Diagnosis  Community acquired pneumonia of left lung, unspecified part of lung [J18.9]    Principal Problem:   CAP (community acquired pneumonia) Active Problems:   Dyspnea   Anxiety   COPD with acute exacerbation (HCC)   CHF (congestive heart failure) (HCC)   Severe protein-calorie malnutrition (HCC)   Malnutrition of moderate degree      Past Medical History:  Diagnosis Date  . Acute renal failure (HCC)   . Anemia   . Angina   . Anxiety   . Arthritis    "right knee" (02/28/2018)  . CAP (community acquired pneumonia) 02/28/2018  . CHF (congestive heart failure) (HCC)   . COPD (chronic obstructive pulmonary disease) (HCC)   . Degenerative disorder of eye    Right eye  . Depression   . Dizziness   . Fluid retention   . GERD (gastroesophageal reflux disease)   . History of hiatal hernia   . Hypertension   . Pneumonia    "get pneumonia 2-3 times q yr" (02/28/2018)  . Shortness of breath     Past Surgical History:  Procedure Laterality Date  . FRACTURE SURGERY    . LAPAROSCOPIC CHOLECYSTECTOMY    . PATELLA FRACTURE SURGERY Right   . TUBAL LIGATION         History of present illness and  Hospital Course:     Kindly see H&P for history of present illness and admission details, please review complete Labs, Consult reports and Test reports for all details in brief  HPI  from the history and  physical done on the day of admission 02/28/2018  WUJ:WJXB [name] [name] a 67 y.o.femalewith a history of COPD, 1 diastolic CHF, anxiety. Patient seen for shortness of breath that is been worsening over the past week. She is only able to ambulate a few feet before becoming short of breath and needing to sit and rest. Her symptoms are worsening. No apparent palliating or provoking factors. She has a nonproductive cough with wheezing. She has been on her inhaler, which has not helped. Denies fevers, chills, nausea, vomiting, hematemesis or hemoptysis. Emergency Department Course: Received Solu-Medrol. Chest x-ray shows pneumonia. White count normal. No fever no fever. Patient is quite tachypneic and remains so after nebulizer treatment. Observation course: Remains very weak. Has no heat at home Seems food insecure and possibly even fearful.PT and OT recommend skilled nursing facility placement. Now awaiting placement.  Patient is now being admitted due to severe malnutrition and further treatment of  her pneumonia.  Subjectively patient is feeling slightly better. Hospital Course   [age] year old female with history of COPD, chronic diastolic CHF, anxiety, who was admitted to the hospital on 02/28/2018 with shortness of breath progressive over the last week.  She was found herself to be significantly more and more dyspneic and decided to come to the ED.  She was also complaining of nonproductive cough with wheezing, her home inhalers have not helped.   Community-acquired pneumonia with acute hypoxic respiratory failure -Treated with antibiotics with ceftriaxone and azithromycin during hospital stay, since has improved, afebrile, initial another 2 days of oral antibiotic azithromycin and Vantin as an outpatient for total of 7 days treatment. -Still requiring oxygen at time of discharge, wean as tolerated   COPD exacerbation -Wheezing has improved, continue with nebs on taper on  discharge, nebulizers as needed, dyspnea has improved ,wean off oxygen as tolerated  Anxiety -Continue home regimen with clonazepam  Chronic diastolic CHF -She appears euvolemic, continue with Lasix on as-needed basis  Anemia of chronic disease -Hemoglobin overall stable, slightly downtrending but will continue to monitor.  No bleeding from  Thrombocytopenia -Chronic, stable  Moderate protein calorie malnutrition -Nutritionist consulted, continue with supplements on discharge  Discharge Condition:  Stable   Follow UP      Discharge Instructions  and  Discharge Medications    Discharge Instructions    Discharge instructions   Complete by:  As directed    Follow with Primary MD Ronal FearLam, Lynn E, NP or SNF physician  Get CBC, CMP, 2 view Chest X ray checked  by Primary MD next visit.    Activity: As tolerated with Full fall precautions use walker/cane & assistance as needed   Disposition SNF   Diet: ysphagia 3 with thin liquid heart Healthy  , with feeding assistance and aspiration precautions.  For Heart failure patients - Check your Weight same time everyday, if you gain over 2 pounds, or you develop in leg swelling, experience more shortness of breath or chest pain, call your Primary MD immediately. Follow Cardiac Low Salt Diet and 1.5 lit/day fluid restriction.   On your next visit with your primary care physician please Get Medicines reviewed and adjusted.   Please request your Prim.MD to go over all Hospital Tests and Procedure/Radiological results at the follow up, please get all Hospital records sent to your Prim MD by signing hospital release before you go home.   If you experience worsening of your admission symptoms, develop shortness of breath, life threatening emergency, suicidal or homicidal thoughts you must seek medical attention immediately by calling 911 or calling your MD immediately  if symptoms less severe.  You Must read complete  instructions/literature along with all the possible adverse reactions/side effects for all the Medicines you take and that have been prescribed to you. Take any new Medicines after you have completely understood and accpet all the possible adverse reactions/side effects.   Do not drive, operating heavy machinery, perform activities at heights, swimming or participation in water activities or provide baby sitting services if your were admitted for syncope or siezures until you have seen by Primary MD or a Neurologist and advised to do so again.  Do not drive when taking Pain medications.    Do not take more than prescribed Pain, Sleep and Anxiety Medications  Special Instructions: If you have smoked or chewed Tobacco  in the last 2 yrs please stop smoking, stop any regular Alcohol  and or any Recreational drug use.  Wear Seat belts while driving.   Please note  You were cared for by a hospitalist during your hospital stay. If you have any questions about your discharge medications or the care you received while you were in the hospital after you are discharged, you can call the unit and asked to speak with the hospitalist on call if the hospitalist that took care of you is not available. Once you are discharged, your primary care physician will handle any further medical issues. Please note that NO REFILLS for any discharge medications will be authorized once you are discharged, as it is imperative that you return to your primary care physician (or establish a relationship with a primary care physician if you do not have one) for your aftercare needs so that they can reassess your need for medications and monitor your lab values.   Increase activity slowly   Complete by:  As directed      Allergies as of 03/06/2018      Reactions   Codeine Other (See Comments)   Feel Numb.       Medication List    STOP taking these medications   albuterol 108 (90 Base) MCG/ACT inhaler Commonly known as:   PROVENTIL HFA;VENTOLIN HFA Replaced by:  albuterol (2.5 MG/3ML) 0.083% nebulizer solution     TAKE these medications   albuterol (2.5 MG/3ML) 0.083% nebulizer solution Commonly known as:  PROVENTIL Take 3 mLs (2.5 mg total) by nebulization every 4 (four) hours as needed for wheezing or shortness of breath. Replaces:  albuterol 108 (90 Base) MCG/ACT inhaler   aspirin 81 MG tablet Take 81 mg by mouth daily.   azithromycin 500 MG tablet Commonly known as:  ZITHROMAX Take 1 tablet (500 mg total) by mouth daily for 2 days. Take for 2 days then stop Start taking on:  March 07, 2018   calcium-vitamin D 250-125 MG-UNIT tablet Commonly known as:  OSCAL Take 1 tablet by mouth daily.   cefpodoxime 200 MG tablet Commonly known as:  VANTIN Take 1 tablet (200 mg total) by mouth 2 (two) times daily for 2 days. Please take for 2 days then stop   cholecalciferol 1000 units tablet Commonly known as:  VITAMIN D Take 1,000 Units by mouth daily.   clonazePAM 1 MG tablet Commonly known as:  KLONOPIN Take 1 tablet (1 mg total) by mouth 2 (two) times daily.   CoQ10 200 MG Caps Take 1 capsule by mouth daily.   feeding supplement (ENSURE ENLIVE) Liqd Take 237 mLs by mouth 2 (two) times daily between meals.   FLUoxetine 20 MG capsule Commonly known as:  PROZAC Take 20 mg by mouth daily.   Fluticasone-Salmeterol 250-50 MCG/DOSE Aepb Commonly known as:  ADVAIR Inhale 1 puff into the lungs 2 (two) times daily.   furosemide 40 MG tablet Commonly known as:  LASIX Take 40 mg by mouth daily as needed for fluid.   FUSION PLUS PO Take 1 capsule by mouth daily.   ipratropium-albuterol 0.5-2.5 (3) MG/3ML Soln Commonly known as:  DUONEB Take 3 mLs by nebulization every 6 (six) hours.   lidocaine 5 % Commonly known as:  LIDODERM Place 1 patch onto the skin daily. Remove & Discard patch within 12 hours or as directed by MD   multivitamin with minerals Tabs tablet Take 1 tablet by mouth  daily.   pantoprazole 40 MG tablet Commonly known as:  PROTONIX Take 1 tablet (40 mg total) by mouth daily.   potassium chloride SA 20  MEQ tablet Commonly known as:  K-DUR,KLOR-CON Take 20 mEq by mouth daily.   predniSONE 10 MG tablet Commonly known as:  DELTASONE 40 mg oral daily x2 days, then 30 mg oral daily x2 days, then 20 mg oral daily x2 days, then 10 mg oral daily x2 days, then stop         Diet and Activity recommendation: See Discharge Instructions above   Consults obtained -  None   Major procedures and Radiology Reports - PLEASE review detailed and final reports for all details, in brief -    Dg Chest 2 View  Result Date: 02/28/2018 CLINICAL DATA:  Cough, pneumonia, shortness of breath for 6 weeks, congestion, weakness, nausea and vomiting after eating, history CHF, COPD, hypertension EXAM: CHEST - 2 VIEW COMPARISON:  11/17/2017 FINDINGS: Minimal enlargement of cardiac silhouette. Atherosclerotic calcifications aorta. Diffuse chronic interstitial lung disease changes progressive since 2016. Question superimposed LEFT upper lobe infiltrate versus worsening of chronic interstitial disease. Pleural thickening versus small effusion lower RIGHT chest. No pneumothorax or acute osseous findings. IMPRESSION: Chronic interstitial lung disease changes/fibrosis with question superimposed acute LEFT upper lobe infiltrate. Electronically Signed   By: Ulyses Southward M.D.   On: 02/28/2018 12:59    Micro Results     Recent Results (from the past 240 hour(s))  Culture, blood (routine x 2) Call MD if unable to obtain prior to antibiotics being given     Status: None   Collection Time: 02/28/18  6:45 PM  Result Value Ref Range Status   Specimen Description BLOOD RIGHT ANTECUBITAL  Final   Special Requests   Final    BOTTLES DRAWN AEROBIC ONLY Blood Culture adequate volume   Culture   Final    NO GROWTH 5 DAYS Performed at [facility], [address], [place], [place]  [address]    Report Status 03/05/2018 FINAL  Final  Culture, blood (routine x 2) Call MD if unable to obtain prior to antibiotics being given     Status: None   Collection Time: 02/28/18  7:03 PM  Result Value Ref Range Status   Specimen Description BLOOD RIGHT ANTECUBITAL  Final   Special Requests   Final    BOTTLES DRAWN AEROBIC ONLY Blood Culture adequate volume   Culture   Final    NO GROWTH 5 DAYS Performed at [facility], [address] [place], [place] [address]    Report Status 03/05/2018 FINAL  Final       Today   Subjective:   [name] [name] today has no headache,no chest or  abdominal pain, feels much better today.   Objective:   Blood pressure (!) 113/56, pulse 67, temperature 97.7 F (36.5 C), temperature source Oral, resp. rate 18, height 5\' 5"  (1.651 m), weight 77.1 kg, SpO2 99 %.   Intake/Output Summary (Last 24 hours) at 03/06/2018 1037 Last data filed at 03/06/2018 0532 Gross per 24 hour  Intake 240 ml  Output 500 ml  Net -260 ml    Exam  Awake Alert, Oriented X 3, frail, weak, chronically ill-appearing, no new F.N deficits, Normal affect Symmetrical Chest wall movement, fair air entry bilaterally, no wheezing  RRR,No Gallops,Rubs or new Murmurs, No Parasternal Heave +ve B.Sounds, Abd Soft, No tenderness, No rebound - guarding or rigidity. No Cyanosis, Clubbing or edema, No new Rash or bruise     Data Review   CBC w Diff:  Lab Results  Component Value Date   WBC 5.4 03/05/2018  HGB 8.9 (L) 03/05/2018   HCT 28.7 (L) 03/05/2018   PLT 125 (L) 03/05/2018   LYMPHOPCT 12 03/02/2018   MONOPCT 6 03/02/2018   EOSPCT 0 03/02/2018   BASOPCT 0 03/02/2018    CMP:  Lab Results  Component Value Date   NA 133 (L) 03/05/2018   K 4.8 03/05/2018   CL 103 03/05/2018   CO2 25 03/05/2018   BUN 25 (H) 03/05/2018   CREATININE 1.18 (H) 03/05/2018   PROT 6.7 08/17/2011   ALBUMIN 2.6 (L) 08/17/2011   BILITOT 0.4 08/17/2011   ALKPHOS 80 08/17/2011    AST 24 08/17/2011   ALT 18 08/17/2011  .   Total Time in preparing paper work, data evaluation and todays exam - 30 minutes  Huey Bienenstock M.D on 03/06/2018 at 10:37 AM  [facility]   Office  [phone]

## 2018-03-06 NOTE — Consult Note (Signed)
            [facility]  03/06/2018  [name] 23-Dec-1950 [id number]   Went to seepatient at the bedside to identify possible discharge needs but she wasalreadydischargedper staff. Patient went to skilled nursing facility per therapy recommendation Phineas Semen([facility]).   Per MD note,patient was admitted for worsening shortness of breath, cough and wheezing but home inhalers have not helped. (community acquired pneumonia of left lung, COPD exacerbation, moderate protein calorie malnutrition- RD was consulted)  Patient has discharge instruction to follow up with primary care provider after discharge.  Primary care provider's office is listed as providing transition of care (TOC) follow-up.    For additional questions please contact:  Karin GoldenLorraine A. Kaliq Lege, BSN, RN-BC [facility] PRIMARY CARE Navigator Cell: [phone]

## 2018-03-10 DIAGNOSIS — R0600 Dyspnea, unspecified: Secondary | ICD-10-CM | POA: Diagnosis not present

## 2018-03-10 DIAGNOSIS — R609 Edema, unspecified: Secondary | ICD-10-CM | POA: Diagnosis not present

## 2018-03-10 DIAGNOSIS — J189 Pneumonia, unspecified organism: Secondary | ICD-10-CM | POA: Diagnosis not present

## 2018-03-10 DIAGNOSIS — J449 Chronic obstructive pulmonary disease, unspecified: Secondary | ICD-10-CM | POA: Diagnosis not present

## 2018-03-13 DIAGNOSIS — K5900 Constipation, unspecified: Secondary | ICD-10-CM | POA: Diagnosis not present

## 2018-03-13 DIAGNOSIS — N179 Acute kidney failure, unspecified: Secondary | ICD-10-CM | POA: Diagnosis not present

## 2018-03-13 DIAGNOSIS — D72829 Elevated white blood cell count, unspecified: Secondary | ICD-10-CM | POA: Diagnosis not present

## 2018-03-13 DIAGNOSIS — I5033 Acute on chronic diastolic (congestive) heart failure: Secondary | ICD-10-CM | POA: Diagnosis not present

## 2018-03-14 DIAGNOSIS — R0600 Dyspnea, unspecified: Secondary | ICD-10-CM | POA: Diagnosis not present

## 2018-03-14 DIAGNOSIS — J449 Chronic obstructive pulmonary disease, unspecified: Secondary | ICD-10-CM | POA: Diagnosis not present

## 2018-03-14 DIAGNOSIS — J189 Pneumonia, unspecified organism: Secondary | ICD-10-CM | POA: Diagnosis not present

## 2018-03-14 DIAGNOSIS — D72829 Elevated white blood cell count, unspecified: Secondary | ICD-10-CM | POA: Diagnosis not present

## 2018-03-17 DIAGNOSIS — R0600 Dyspnea, unspecified: Secondary | ICD-10-CM | POA: Diagnosis not present

## 2018-03-17 DIAGNOSIS — I5033 Acute on chronic diastolic (congestive) heart failure: Secondary | ICD-10-CM | POA: Diagnosis not present

## 2018-03-17 DIAGNOSIS — D72829 Elevated white blood cell count, unspecified: Secondary | ICD-10-CM | POA: Diagnosis not present

## 2018-03-17 DIAGNOSIS — J189 Pneumonia, unspecified organism: Secondary | ICD-10-CM | POA: Diagnosis not present

## 2018-03-18 DIAGNOSIS — R609 Edema, unspecified: Secondary | ICD-10-CM | POA: Diagnosis not present

## 2018-03-18 DIAGNOSIS — D72829 Elevated white blood cell count, unspecified: Secondary | ICD-10-CM | POA: Diagnosis not present

## 2018-03-18 DIAGNOSIS — N179 Acute kidney failure, unspecified: Secondary | ICD-10-CM | POA: Diagnosis not present

## 2018-03-18 DIAGNOSIS — D7589 Other specified diseases of blood and blood-forming organs: Secondary | ICD-10-CM | POA: Diagnosis not present

## 2018-03-25 ENCOUNTER — Other Ambulatory Visit: Payer: Self-pay | Admitting: *Deleted

## 2018-03-25 NOTE — Patient Outreach (Signed)
[facility]) Care Management  03/25/2018  [name] [name] 23-Nov-1950 [id number]   Facility site visit to [facility] to discuss patient's progress and plan to transition to home.  Met with [place], [place] and  discharge planner for the facility.   [place] states patient would benefit from [facility] services. [place] stated patient has 2 sons but only one has been active in her care while at the facility. [place] stated it would be best to discuss [facility] CM services with paitent's involved son. [place] agreed to give the [facility] packet of information to patient's son Lennette Bihari. [place] stated patient is improving and is getting close to being ready for discharge.  Placed a call to Lennette Bihari to discuss [employer] [employer].  Unsuccessful outreach but able to leave a voice mail.  Will attempt to outreach son again in the next couple of days.  If unable to reach so will place a [facility] CM referral at the request of facility discharge planner/SW [place].   Plan to also email [place] unsuccessful attempt to reach son.   Rutherford Limerick RN, BSN [place] Acute Care Coordinator [phone]) Business Mobile [phone]) Toll free office

## 2018-03-26 DIAGNOSIS — F329 Major depressive disorder, single episode, unspecified: Secondary | ICD-10-CM | POA: Diagnosis not present

## 2018-03-26 DIAGNOSIS — Z79899 Other long term (current) drug therapy: Secondary | ICD-10-CM | POA: Diagnosis not present

## 2018-03-26 DIAGNOSIS — F419 Anxiety disorder, unspecified: Secondary | ICD-10-CM | POA: Diagnosis not present

## 2018-03-27 DIAGNOSIS — H547 Unspecified visual loss: Secondary | ICD-10-CM | POA: Diagnosis not present

## 2018-03-28 DIAGNOSIS — H2513 Age-related nuclear cataract, bilateral: Secondary | ICD-10-CM | POA: Diagnosis not present

## 2018-03-28 DIAGNOSIS — H25043 Posterior subcapsular polar age-related cataract, bilateral: Secondary | ICD-10-CM | POA: Diagnosis not present

## 2018-03-28 DIAGNOSIS — H3540 Unspecified peripheral retinal degeneration: Secondary | ICD-10-CM | POA: Diagnosis not present

## 2018-03-31 DIAGNOSIS — J189 Pneumonia, unspecified organism: Secondary | ICD-10-CM | POA: Diagnosis not present

## 2018-03-31 DIAGNOSIS — I5033 Acute on chronic diastolic (congestive) heart failure: Secondary | ICD-10-CM | POA: Diagnosis not present

## 2018-03-31 DIAGNOSIS — R0600 Dyspnea, unspecified: Secondary | ICD-10-CM | POA: Diagnosis not present

## 2018-03-31 DIAGNOSIS — J449 Chronic obstructive pulmonary disease, unspecified: Secondary | ICD-10-CM | POA: Diagnosis not present

## 2018-04-01 DIAGNOSIS — J189 Pneumonia, unspecified organism: Secondary | ICD-10-CM | POA: Diagnosis not present

## 2018-04-01 DIAGNOSIS — J449 Chronic obstructive pulmonary disease, unspecified: Secondary | ICD-10-CM | POA: Diagnosis not present

## 2018-04-01 DIAGNOSIS — I5033 Acute on chronic diastolic (congestive) heart failure: Secondary | ICD-10-CM | POA: Diagnosis not present

## 2018-04-01 DIAGNOSIS — R0600 Dyspnea, unspecified: Secondary | ICD-10-CM | POA: Diagnosis not present

## 2018-04-02 ENCOUNTER — Other Ambulatory Visit: Payer: Self-pay | Admitting: *Deleted

## 2018-04-02 NOTE — Patient Outreach (Signed)
[facility]) Care Management  04/02/2018  [name] [name] [birth year] [id number]   Outreach x2 to patient's son [name] [name] unsuccessful. Called 03/31/18 and left vm. Called 04/02/18 and left vm.   Email sent to discharge planner Brittani at [facility] to make her aware of unsuccessful outreach attempts.   Plan to see patient at next facility site visit if not discharged.  Costella Hatcher RN, BSN [facility]  Post Acute Care Coordinator [phone]) Business Mobile [phone]) Toll free office

## 2018-04-03 ENCOUNTER — Encounter (HOSPITAL_COMMUNITY): Payer: Self-pay | Admitting: Emergency Medicine

## 2018-04-03 ENCOUNTER — Emergency Department (HOSPITAL_COMMUNITY): Payer: Medicare HMO

## 2018-04-03 ENCOUNTER — Emergency Department (HOSPITAL_COMMUNITY)
Admission: EM | Admit: 2018-04-03 | Discharge: 2018-04-03 | Disposition: A | Discharge status: Home / Self Care | Payer: Medicare HMO | Attending: Emergency Medicine | Admitting: Emergency Medicine

## 2018-04-03 DIAGNOSIS — Z87891 Personal history of nicotine dependence: Secondary | ICD-10-CM | POA: Insufficient documentation

## 2018-04-03 DIAGNOSIS — R0600 Dyspnea, unspecified: Secondary | ICD-10-CM | POA: Diagnosis not present

## 2018-04-03 DIAGNOSIS — J189 Pneumonia, unspecified organism: Secondary | ICD-10-CM | POA: Insufficient documentation

## 2018-04-03 DIAGNOSIS — J449 Chronic obstructive pulmonary disease, unspecified: Secondary | ICD-10-CM | POA: Diagnosis not present

## 2018-04-03 DIAGNOSIS — I509 Heart failure, unspecified: Secondary | ICD-10-CM | POA: Diagnosis not present

## 2018-04-03 DIAGNOSIS — Z79899 Other long term (current) drug therapy: Secondary | ICD-10-CM | POA: Diagnosis not present

## 2018-04-03 DIAGNOSIS — I5033 Acute on chronic diastolic (congestive) heart failure: Secondary | ICD-10-CM | POA: Diagnosis not present

## 2018-04-03 DIAGNOSIS — Z7982 Long term (current) use of aspirin: Secondary | ICD-10-CM | POA: Insufficient documentation

## 2018-04-03 DIAGNOSIS — R0602 Shortness of breath: Secondary | ICD-10-CM | POA: Diagnosis not present

## 2018-04-03 DIAGNOSIS — Z09 Encounter for follow-up examination after completed treatment for conditions other than malignant neoplasm: Secondary | ICD-10-CM | POA: Diagnosis not present

## 2018-04-03 DIAGNOSIS — I110 Hypertensive heart disease with heart failure: Secondary | ICD-10-CM | POA: Diagnosis not present

## 2018-04-03 DIAGNOSIS — I959 Hypotension, unspecified: Secondary | ICD-10-CM | POA: Diagnosis not present

## 2018-04-03 LAB — BASIC METABOLIC PANEL
Anion gap: 7 (ref 5–15)
BUN: 13 mg/dL (ref 8–23)
CO2: 27 mmol/L (ref 22–32)
Calcium: 8.3 mg/dL — ABNORMAL LOW (ref 8.9–10.3)
Chloride: 103 mmol/L (ref 98–111)
Creatinine, Ser: 1.17 mg/dL — ABNORMAL HIGH (ref 0.44–1.00)
GFR calc Af Amer: 55 mL/min — ABNORMAL LOW (ref >60)
GFR calc non Af Amer: 48 mL/min — ABNORMAL LOW (ref >60)
Glucose, Bld: 96 mg/dL (ref 70–99)
POTASSIUM: 4.3 mmol/L (ref 3.5–5.1)
Sodium: 137 mmol/L (ref 135–145)

## 2018-04-03 LAB — CBC
HCT: 30.3 % — ABNORMAL LOW (ref 36.0–46.0)
HEMOGLOBIN: 9.2 g/dL — AB (ref 12.0–15.0)
MCH: 32.4 pg (ref 26.0–34.0)
MCHC: 30.4 g/dL (ref 30.0–36.0)
MCV: 106.7 fL — ABNORMAL HIGH (ref 80.0–100.0)
Platelets: 238 10*3/uL (ref 150–400)
RBC: 2.84 MIL/uL — ABNORMAL LOW (ref 3.87–5.11)
RDW: 14.2 % (ref 11.5–15.5)
WBC: 4.6 10*3/uL (ref 4.0–10.5)
nRBC: 0 % (ref 0.0–0.2)

## 2018-04-03 MED ORDER — SODIUM CHLORIDE 0.9 % IV SOLN
1.0000 g | Freq: Once | INTRAVENOUS | Status: AC
  Administered 2018-04-03: 1 g via INTRAVENOUS
  Filled 2018-04-03: qty 10

## 2018-04-03 MED ORDER — CEFDINIR 300 MG PO CAPS: 300.0000 mg | ORAL_CAPSULE | Freq: Two times a day (BID) | ORAL | 0 refills | Status: AC

## 2018-04-03 MED ORDER — SODIUM CHLORIDE 0.9 % IV SOLN
500.0000 mg | Freq: Once | INTRAVENOUS | Status: AC
  Administered 2018-04-03: 500 mg via INTRAVENOUS

## 2018-04-03 MED ORDER — IPRATROPIUM BROMIDE 0.02 % IN SOLN
0.5000 mg | Freq: Once | RESPIRATORY_TRACT | Status: AC
  Administered 2018-04-03: 0.5 mg via RESPIRATORY_TRACT
  Filled 2018-04-03: qty 2.5

## 2018-04-03 MED ORDER — AZITHROMYCIN 250 MG PO TABS: 250.0000 mg | ORAL_TABLET | Freq: Every day | ORAL | 0 refills | Status: AC

## 2018-04-03 MED ORDER — ALBUTEROL SULFATE (2.5 MG/3ML) 0.083% IN NEBU
5.0000 mg | INHALATION_SOLUTION | Freq: Once | RESPIRATORY_TRACT | Status: AC
  Administered 2018-04-03: 5 mg via RESPIRATORY_TRACT
  Filled 2018-04-03: qty 6

## 2018-04-03 NOTE — ED Triage Notes (Signed)
Pt arrives from [facility] where she has been for 6 days- ems reports pt has chronic COPD/CHF and asthma- pt is also being treated for pneumonia.  Pt was sent to ED for sob- pt received breathing treatment and has no complaints or sob.

## 2018-04-03 NOTE — ED Provider Notes (Signed)
[facility] EMERGENCY DEPARTMENT Provider Note   CSN: [id number] Arrival date & time: 04/03/18  1359     History   Chief Complaint Chief Complaint  Patient presents with  . Shortness of Breath    HPI Tykisha Courtemanche is a 68 y.o. female.  Patient with hx copd, home o2 4 liters, chf, presents from ecf. Pt recently inpatient with pna, friend states had cxr at [facility] which showed pna still there. Symptoms/chr copd, mod-sev, persistent, without abrupt worsening.  Pt denies cough or sore throat. No chest pain or discomfort. Pt indicates she feels breathing is consistent with baseline.  Pt notes chronic bil leg edema, no acute change or worsening. Denies fever or chills. Compliant w home meds.   The history is provided by the patient, a friend and the EMS personnel.  Shortness of Breath  Associated symptoms: no abdominal pain, no chest pain, no cough, no fever, no headaches, no neck pain, no rash, no sore throat and no vomiting     Past Medical History:  Diagnosis Date  . Acute renal failure (HCC)   . Anemia   . Angina   . Anxiety   . Arthritis    "right knee" (02/28/2018)  . CAP (community acquired pneumonia) 02/28/2018  . CHF (congestive heart failure) (HCC)   . COPD (chronic obstructive pulmonary disease) (HCC)   . Degenerative disorder of eye    Right eye  . Depression   . Dizziness   . Fluid retention   . GERD (gastroesophageal reflux disease)   . History of hiatal hernia   . Hypertension   . Pneumonia    "get pneumonia 2-3 times q yr" (02/28/2018)  . Shortness of breath     Patient Active Problem List   Diagnosis Date Noted  . Malnutrition of moderate degree 03/06/2018  . Severe protein-calorie malnutrition (HCC) 03/03/2018  . CAP (community acquired pneumonia) 02/28/2018  . Dyspnea 02/28/2018  . Anxiety 02/28/2018  . COPD with acute exacerbation (HCC) 02/28/2018  . CHF (congestive heart failure) (HCC)   . Generalized weakness 11/05/2011  .  Anemia 11/05/2011  . COPD (chronic obstructive pulmonary disease) (HCC) 11/05/2011  . Dysphagia 08/20/2011  . Abnormal CT scan, chest 08/17/2011    Past Surgical History:  Procedure Laterality Date  . FRACTURE SURGERY    . LAPAROSCOPIC CHOLECYSTECTOMY    . PATELLA FRACTURE SURGERY Right   . TUBAL LIGATION       OB History   No obstetric history on file.      Home Medications    Prior to Admission medications   Medication Sig Start Date End Date Taking? Authorizing Provider  albuterol (PROVENTIL) (2.5 MG/3ML) 0.083% nebulizer solution Take 3 mLs (2.5 mg total) by nebulization every 4 (four) hours as needed for wheezing or shortness of breath. 03/06/18   Elgergawy, Leana Roe, MD  aspirin 81 MG tablet Take 81 mg by mouth daily.    [provider]  calcium-vitamin D (OSCAL) 250-125 MG-UNIT per tablet Take 1 tablet by mouth daily. 11/07/11 02/28/18  Larey Seat, MD  cholecalciferol (VITAMIN D) 1000 units tablet Take 1,000 Units by mouth daily.    [provider]  clonazePAM (KLONOPIN) 1 MG tablet Take 1 tablet (1 mg total) by mouth 2 (two) times daily. 03/06/18   Elgergawy, Leana Roe, MD  Coenzyme Q10 (COQ10) 200 MG CAPS Take 1 capsule by mouth daily.    [provider]  feeding supplement, ENSURE ENLIVE, (ENSURE ENLIVE) LIQD Take  237 mLs by mouth 2 (two) times daily between meals. 03/06/18   Elgergawy, Leana Roeawood S, MD  FLUoxetine (PROZAC) 20 MG capsule Take 20 mg by mouth daily.    [provider]  Fluticasone-Salmeterol (ADVAIR) 250-50 MCG/DOSE AEPB Inhale 1 puff into the lungs 2 (two) times daily. Patient not taking: Reported on 05/07/2017 11/07/11   Larey SeatKesty, Jenna M, MD  furosemide (LASIX) 40 MG tablet Take 40 mg by mouth daily as needed for fluid.     [provider]  ipratropium-albuterol (DUONEB) 0.5-2.5 (3) MG/3ML SOLN Take 3 mLs by nebulization every 6 (six) hours. 03/06/18   Elgergawy, Leana Roeawood S, MD  Iron-FA-B Cmp-C-Biot-Probiotic (FUSION  PLUS PO) Take 1 capsule by mouth daily.    [provider]  lidocaine (LIDODERM) 5 % Place 1 patch onto the skin daily. Remove & Discard patch within 12 hours or as directed by MD Patient not taking: Reported on 02/28/2018 11/17/17   Anselm PancoastJoy, Shawn C, PA-C  Multiple Vitamin (MULTIVITAMIN WITH MINERALS) TABS tablet Take 1 tablet by mouth daily.    [provider]  pantoprazole (PROTONIX) 40 MG tablet Take 1 tablet (40 mg total) by mouth daily. 03/06/18   Elgergawy, Leana Roeawood S, MD  potassium chloride SA (K-DUR,KLOR-CON) 20 MEQ tablet Take 20 mEq by mouth daily.     [provider]  predniSONE (DELTASONE) 10 MG tablet 40 mg oral daily x2 days, then 30 mg oral daily x2 days, then 20 mg oral daily x2 days, then 10 mg oral daily x2 days, then stop 03/06/18   Elgergawy, Leana Roeawood S, MD    Family History Family History  Problem Relation Age of Onset  . Heart failure Mother   . Stroke Father     Social History Social History   Tobacco Use  . Smoking status: Former Smoker    Packs/day: 0.10    Years: 30.00    Pack years: 3.00    Last attempt to quit: 2002    Years since quitting: 18.0  . Smokeless tobacco: Current User    Types: Snuff  Substance Use Topics  . Alcohol use: Never    Frequency: Never  . Drug use: Never     Allergies   Codeine   Review of Systems Review of Systems  Constitutional: Negative for fever.  HENT: Negative for sore throat.   Eyes: Negative for redness.  Respiratory: Positive for shortness of breath. Negative for cough.   Cardiovascular: Positive for leg swelling. Negative for chest pain.  Gastrointestinal: Negative for abdominal pain and vomiting.  Endocrine: Negative for polyuria.  Genitourinary: Negative for flank pain.  Musculoskeletal: Negative for back pain and neck pain.  Skin: Negative for rash.  Neurological: Negative for headaches.  Hematological: Does not bruise/bleed easily.  Psychiatric/Behavioral: Negative for confusion.       Physical Exam Updated Vital Signs BP (!) 101/50   Pulse 80   Temp 98 F (36.7 C) (Oral)   Resp 17   SpO2 100%   Physical Exam Vitals signs and nursing note reviewed.  Constitutional:      Appearance: Normal appearance. She is well-developed.  HENT:     Head: Atraumatic.     Nose: Nose normal.     Mouth/Throat:     Mouth: Mucous membranes are moist.  Eyes:     General: No scleral icterus.    Conjunctiva/sclera: Conjunctivae normal.     Pupils: Pupils are equal, round, and reactive to light.  Neck:     Musculoskeletal: Normal range  of motion and neck supple. No neck rigidity or muscular tenderness.     Trachea: No tracheal deviation.  Cardiovascular:     Rate and Rhythm: Normal rate and regular rhythm.     Pulses: Normal pulses.     Heart sounds: Normal heart sounds. No murmur. No friction rub. No gallop.   Pulmonary:     Effort: Pulmonary effort is normal. No respiratory distress.     Breath sounds: Normal breath sounds.  Abdominal:     General: Bowel sounds are normal. There is no distension.     Palpations: Abdomen is soft.     Tenderness: There is no abdominal tenderness. There is no guarding.  Genitourinary:    Comments: No cva tenderness.  Musculoskeletal:     Right lower leg: Edema present.     Left lower leg: Edema present.  Skin:    General: Skin is warm and dry.     Findings: No rash.  Neurological:     Mental Status: She is alert.     Comments: Alert, speech normal.   Psychiatric:        Mood and Affect: Mood normal.      ED Treatments / Results  Labs (all labs ordered are listed, but only abnormal results are displayed) Results for orders placed or performed during the hospital encounter of 04/03/18  Basic metabolic panel  Result Value Ref Range   Sodium 137 135 - 145 mmol/L   Potassium 4.3 3.5 - 5.1 mmol/L   Chloride 103 98 - 111 mmol/L   CO2 27 22 - 32 mmol/L   Glucose, Bld 96 70 - 99 mg/dL   BUN 13 8 - 23 mg/dL   Creatinine, Ser  1.611.17 (H) 0.44 - 1.00 mg/dL   Calcium 8.3 (L) 8.9 - 10.3 mg/dL   GFR calc non Af Amer 48 (L) >60 mL/min   GFR calc Af Amer 55 (L) >60 mL/min   Anion gap 7 5 - 15  CBC  Result Value Ref Range   WBC 4.6 4.0 - 10.5 K/uL   RBC 2.84 (L) 3.87 - 5.11 MIL/uL   Hemoglobin 9.2 (L) 12.0 - 15.0 g/dL   HCT 09.630.3 (L) 04.536.0 - 40.946.0 %   MCV 106.7 (H) 80.0 - 100.0 fL   MCH 32.4 26.0 - 34.0 pg   MCHC 30.4 30.0 - 36.0 g/dL   RDW 81.114.2 91.411.5 - 78.215.5 %   Platelets 238 150 - 400 K/uL   nRBC 0.0 0.0 - 0.2 %   Dg Chest 2 View  Result Date: 04/03/2018 CLINICAL DATA:  Chronic shortness of breath which acutely worsened earlier today but was relieved with a breathing treatment. EXAM: CHEST - 2 VIEW COMPARISON:  02/28/2018 and earlier. FINDINGS: AP ERECT and LATERAL images were obtained. Cardiac silhouette mildly enlarged, unchanged. Thoracic aorta atherosclerotic, unchanged. Prominent central pulmonary arteries, unchanged. Baseline chronic interstitial fibrosis with superimposed patchy opacities throughout both lungs, stable in the LEFT UPPER LOBE and RIGHT LOWER LOBE since the examination 1 month ago but worse in the RIGHT UPPER LOBE and LEFT LOWER LOBE. Small BILATERAL pleural effusions, unchanged, including fluid in the LEFT major fissure. IMPRESSION: Baseline chronic interstitial fibrosis with superimposed pneumonia (favored over airspace pulmonary edema) throughout both lungs. The opacities are stable in the LEFT UPPER LOBE and RIGHT LOWER LOBE since the examination 1 month ago but worse in the RIGHT UPPER LOBE and LEFT LOWER LOBE and LEFT LOWER LOBE. Electronically Signed   By: Kayren Eaveshomas  Lawrence M.D.  On: 04/03/2018 16:14    EKG EKG Interpretation  Date/Time:  Thursday April 03 2018 14:38:25 EST Ventricular Rate:  78 PR Interval:    QRS Duration: 95 QT Interval:  377 QTC Calculation: 430 R Axis:   67 Text Interpretation:  Sinus rhythm Atrial premature complex Probable left atrial enlargement Confirmed by Geoffery Lyons ([id number]) on 04/03/2018 2:42:12 PM   Radiology Dg Chest 2 View  Result Date: 04/03/2018 CLINICAL DATA:  Chronic shortness of breath which acutely worsened earlier today but was relieved with a breathing treatment. EXAM: CHEST - 2 VIEW COMPARISON:  02/28/2018 and earlier. FINDINGS: AP ERECT and LATERAL images were obtained. Cardiac silhouette mildly enlarged, unchanged. Thoracic aorta atherosclerotic, unchanged. Prominent central pulmonary arteries, unchanged. Baseline chronic interstitial fibrosis with superimposed patchy opacities throughout both lungs, stable in the LEFT UPPER LOBE and RIGHT LOWER LOBE since the examination 1 month ago but worse in the RIGHT UPPER LOBE and LEFT LOWER LOBE. Small BILATERAL pleural effusions, unchanged, including fluid in the LEFT major fissure. IMPRESSION: Baseline chronic interstitial fibrosis with superimposed pneumonia (favored over airspace pulmonary edema) throughout both lungs. The opacities are stable in the LEFT UPPER LOBE and RIGHT LOWER LOBE since the examination 1 month ago but worse in the RIGHT UPPER LOBE and LEFT LOWER LOBE and LEFT LOWER LOBE. Electronically Signed   By: Hulan Saas M.D.   On: 04/03/2018 16:14    Procedures Procedures (including critical care time)  Medications Ordered in ED Medications - No data to display   Initial Impression / Assessment and Plan / ED Course  I have reviewed the triage vital signs and the nursing notes.  Pertinent labs & imaging results that were available during my care of the patient were reviewed by me and considered in my medical decision making (see chart for details).  Iv ns. Labs. Cxr.  Reviewed nursing notes and prior charts for additional history.   Recheck sl wheezing, albuterol and atrovent neb.  cxr reviewed - infiltrates/pna noted. Rocephin iv, zithromax iv.   Labs reviewed  - wbc normal, chem normal.   Although cxr/infiltrate appears increased from prior, pt/fam indicate her  breathing appears c/w baseline. Pt denies increased cough. Denies sore throat or runny nose. Denies fever/chills.   As pt feels breathing at baseline, afebrile, labs c/w baseline, sats 99-100% - pt currently appears stable for d/c. Will give abx rx, and rec close pcp f/u.     Final Clinical Impressions(s) / ED Diagnoses   Final diagnoses:  None    ED Discharge Orders    None       Cathren Laine, MD 04/03/18 1710

## 2018-04-03 NOTE — ED Notes (Signed)
Pt back from X-ray.  

## 2018-04-03 NOTE — Discharge Instructions (Signed)
It was our pleasure to provide your ER care today - we hope that you feel better.  Take antibiotics as prescribed.  Follow up with primary care doctor in the next 1-2 days for recheck.  Return to ER if right away if worse, new symptoms, high fevers, increased difficulty breathing, other concern.

## 2018-04-03 NOTE — ED Notes (Signed)
Patient verbalizes understanding of discharge instructions. Opportunity for questioning and answers were provided. Armband removed by staff, pt discharged from ED with PTAR.  

## 2018-04-07 DIAGNOSIS — J449 Chronic obstructive pulmonary disease, unspecified: Secondary | ICD-10-CM | POA: Diagnosis not present

## 2018-04-07 DIAGNOSIS — I5033 Acute on chronic diastolic (congestive) heart failure: Secondary | ICD-10-CM | POA: Diagnosis not present

## 2018-04-07 DIAGNOSIS — J189 Pneumonia, unspecified organism: Secondary | ICD-10-CM | POA: Diagnosis not present

## 2018-04-07 DIAGNOSIS — R0600 Dyspnea, unspecified: Secondary | ICD-10-CM | POA: Diagnosis not present

## 2018-04-08 ENCOUNTER — Other Ambulatory Visit: Payer: Self-pay | Admitting: *Deleted

## 2018-04-08 DIAGNOSIS — F419 Anxiety disorder, unspecified: Secondary | ICD-10-CM | POA: Diagnosis not present

## 2018-04-08 DIAGNOSIS — F329 Major depressive disorder, single episode, unspecified: Secondary | ICD-10-CM | POA: Diagnosis not present

## 2018-04-08 NOTE — Patient Outreach (Signed)
[facility]) Care Management  04/08/2018  [name] [name] 02/03/1951 [id number]   Subjective: Telephone call to patient's home  / mobile number, spoke with female answering phone, states [name] [name] not at home, left HIPAA compliant message for [name] [name], and requested call back.    Objective: Per KPN (Knowledge Performance Now, point of care tool) and chart review, patient hospitalized 02/28/18-03/06/18 for Community acquired pneumonia of left lung, patient discharged from hospital to [facility].   Patient had ED visit on 04/03/2018 for shortness of breath.  Patient discharged from [facility] on 04/04/2018.  Patient also has a history of hypertension, COPD, congestive heart failure, malnutrition, and home oxygen at 4 liters.      Assessment: Received [employer] Transition of care referral on 04/08/2018.  Transition of care follow up pending patient contact.       Plan: RNCM will send unsuccessful outreach  letter, [facility] pamphlet, will call patient for 2nd telephone outreach attempt within 4 business days,  transition of care follow up, and proceed with case closure, within 10 business days if no return call.       Kayvon Mo H. Gardiner Barefoot, BSN, CCM [facility] Care Management [facility] Telephonic CM Phone: [phone] Fax: [phone]

## 2018-04-10 ENCOUNTER — Other Ambulatory Visit: Payer: Self-pay | Admitting: *Deleted

## 2018-04-10 NOTE — Patient Outreach (Signed)
[facility]) Care Management  04/10/2018  Eliah Zhao 11-13-1950 [id number]   Subjective: Telephone call to patient's home  / mobile number, spoke with female answering phone, states Aquetzalli Dub not at home, left HIPAA compliant message for Ovee Pendleton, and requested call back.    Objective: Per KPN (Knowledge Performance Now, point of care tool) and chart review, patient hospitalized 02/28/18-03/06/18 for Community acquired pneumonia of left lung, patient discharged from hospital to [facility] and  [facility].   Patient had ED visit on 04/03/2018 for shortness of breath.  Patient discharged from [place] on 04/04/2018.  Patient also has a history of hypertension, COPD, congestive heart failure, malnutrition, and home oxygen at 4 liters.      Assessment: Received [employer] Transition of care referral on 04/08/2018.  Transition of care follow up pending patient contact.       Plan: RNCM has sent unsuccessful outreach  letter, [facility] pamphlet, will call patient for 3rd telephone outreach attempt within 4 business days,  transition of care follow up, and proceed with case closure, within 10 business days if no return call.      Branch Pacitti H. Gardiner Barefoot, BSN, CCM [facility] Care Management [facility] Telephonic CM Phone: [phone] Fax: [phone]

## 2018-04-11 ENCOUNTER — Other Ambulatory Visit: Payer: Self-pay | Admitting: *Deleted

## 2018-04-11 ENCOUNTER — Encounter (HOSPITAL_COMMUNITY): Payer: Self-pay

## 2018-04-11 ENCOUNTER — Other Ambulatory Visit: Payer: Self-pay

## 2018-04-11 ENCOUNTER — Emergency Department (HOSPITAL_COMMUNITY): Payer: Medicare HMO

## 2018-04-11 ENCOUNTER — Inpatient Hospital Stay (HOSPITAL_COMMUNITY)
Admission: EM | Admit: 2018-04-11 | Discharge: 2018-04-19 | DRG: 207 | Disposition: E | Payer: Medicare HMO | Source: Skilled Nursing Facility | Attending: Pulmonary Disease | Admitting: Pulmonary Disease

## 2018-04-11 DIAGNOSIS — Z515 Encounter for palliative care: Secondary | ICD-10-CM | POA: Diagnosis not present

## 2018-04-11 DIAGNOSIS — Z823 Family history of stroke: Secondary | ICD-10-CM | POA: Diagnosis not present

## 2018-04-11 DIAGNOSIS — L8990 Pressure ulcer of unspecified site, unspecified stage: Secondary | ICD-10-CM

## 2018-04-11 DIAGNOSIS — J90 Pleural effusion, not elsewhere classified: Secondary | ICD-10-CM | POA: Diagnosis not present

## 2018-04-11 DIAGNOSIS — Z9981 Dependence on supplemental oxygen: Secondary | ICD-10-CM | POA: Diagnosis not present

## 2018-04-11 DIAGNOSIS — Z8249 Family history of ischemic heart disease and other diseases of the circulatory system: Secondary | ICD-10-CM | POA: Diagnosis not present

## 2018-04-11 DIAGNOSIS — Y95 Nosocomial condition: Secondary | ICD-10-CM | POA: Diagnosis present

## 2018-04-11 DIAGNOSIS — K219 Gastro-esophageal reflux disease without esophagitis: Secondary | ICD-10-CM | POA: Diagnosis not present

## 2018-04-11 DIAGNOSIS — I272 Pulmonary hypertension, unspecified: Secondary | ICD-10-CM | POA: Diagnosis present

## 2018-04-11 DIAGNOSIS — J440 Chronic obstructive pulmonary disease with acute lower respiratory infection: Secondary | ICD-10-CM | POA: Diagnosis not present

## 2018-04-11 DIAGNOSIS — E872 Acidosis: Secondary | ICD-10-CM | POA: Diagnosis not present

## 2018-04-11 DIAGNOSIS — R000 Tachycardia, unspecified: Secondary | ICD-10-CM | POA: Diagnosis not present

## 2018-04-11 DIAGNOSIS — J9690 Respiratory failure, unspecified, unspecified whether with hypoxia or hypercapnia: Secondary | ICD-10-CM | POA: Diagnosis not present

## 2018-04-11 DIAGNOSIS — J9622 Acute and chronic respiratory failure with hypercapnia: Secondary | ICD-10-CM | POA: Diagnosis not present

## 2018-04-11 DIAGNOSIS — F329 Major depressive disorder, single episode, unspecified: Secondary | ICD-10-CM | POA: Diagnosis not present

## 2018-04-11 DIAGNOSIS — N179 Acute kidney failure, unspecified: Secondary | ICD-10-CM | POA: Diagnosis present

## 2018-04-11 DIAGNOSIS — R4182 Altered mental status, unspecified: Secondary | ICD-10-CM | POA: Diagnosis not present

## 2018-04-11 DIAGNOSIS — Z4682 Encounter for fitting and adjustment of non-vascular catheter: Secondary | ICD-10-CM | POA: Diagnosis not present

## 2018-04-11 DIAGNOSIS — I5033 Acute on chronic diastolic (congestive) heart failure: Secondary | ICD-10-CM | POA: Diagnosis not present

## 2018-04-11 DIAGNOSIS — R0682 Tachypnea, not elsewhere classified: Secondary | ICD-10-CM | POA: Diagnosis not present

## 2018-04-11 DIAGNOSIS — Z8701 Personal history of pneumonia (recurrent): Secondary | ICD-10-CM

## 2018-04-11 DIAGNOSIS — H548 Legal blindness, as defined in USA: Secondary | ICD-10-CM | POA: Diagnosis present

## 2018-04-11 DIAGNOSIS — R0902 Hypoxemia: Secondary | ICD-10-CM | POA: Diagnosis not present

## 2018-04-11 DIAGNOSIS — Z66 Do not resuscitate: Secondary | ICD-10-CM | POA: Diagnosis present

## 2018-04-11 DIAGNOSIS — R34 Anuria and oliguria: Secondary | ICD-10-CM | POA: Diagnosis not present

## 2018-04-11 DIAGNOSIS — J80 Acute respiratory distress syndrome: Secondary | ICD-10-CM | POA: Diagnosis not present

## 2018-04-11 DIAGNOSIS — I110 Hypertensive heart disease with heart failure: Secondary | ICD-10-CM | POA: Diagnosis present

## 2018-04-11 DIAGNOSIS — I361 Nonrheumatic tricuspid (valve) insufficiency: Secondary | ICD-10-CM | POA: Diagnosis not present

## 2018-04-11 DIAGNOSIS — F419 Anxiety disorder, unspecified: Secondary | ICD-10-CM | POA: Diagnosis present

## 2018-04-11 DIAGNOSIS — R918 Other nonspecific abnormal finding of lung field: Secondary | ICD-10-CM | POA: Diagnosis not present

## 2018-04-11 DIAGNOSIS — R0602 Shortness of breath: Secondary | ICD-10-CM | POA: Diagnosis not present

## 2018-04-11 DIAGNOSIS — J9621 Acute and chronic respiratory failure with hypoxia: Secondary | ICD-10-CM | POA: Diagnosis present

## 2018-04-11 DIAGNOSIS — E440 Moderate protein-calorie malnutrition: Secondary | ICD-10-CM | POA: Diagnosis not present

## 2018-04-11 DIAGNOSIS — J849 Interstitial pulmonary disease, unspecified: Secondary | ICD-10-CM | POA: Diagnosis not present

## 2018-04-11 DIAGNOSIS — Z87891 Personal history of nicotine dependence: Secondary | ICD-10-CM | POA: Diagnosis not present

## 2018-04-11 DIAGNOSIS — J189 Pneumonia, unspecified organism: Secondary | ICD-10-CM | POA: Diagnosis not present

## 2018-04-11 DIAGNOSIS — R6521 Severe sepsis with septic shock: Secondary | ICD-10-CM | POA: Diagnosis not present

## 2018-04-11 DIAGNOSIS — A419 Sepsis, unspecified organism: Secondary | ICD-10-CM | POA: Diagnosis not present

## 2018-04-11 DIAGNOSIS — I5030 Unspecified diastolic (congestive) heart failure: Secondary | ICD-10-CM | POA: Diagnosis not present

## 2018-04-11 DIAGNOSIS — G9340 Encephalopathy, unspecified: Secondary | ICD-10-CM | POA: Diagnosis not present

## 2018-04-11 DIAGNOSIS — I959 Hypotension, unspecified: Secondary | ICD-10-CM | POA: Diagnosis not present

## 2018-04-11 DIAGNOSIS — D539 Nutritional anemia, unspecified: Secondary | ICD-10-CM | POA: Diagnosis present

## 2018-04-11 DIAGNOSIS — Z79899 Other long term (current) drug therapy: Secondary | ICD-10-CM

## 2018-04-11 DIAGNOSIS — J9601 Acute respiratory failure with hypoxia: Secondary | ICD-10-CM

## 2018-04-11 DIAGNOSIS — Z720 Tobacco use: Secondary | ICD-10-CM

## 2018-04-11 DIAGNOSIS — I5031 Acute diastolic (congestive) heart failure: Secondary | ICD-10-CM | POA: Diagnosis not present

## 2018-04-11 DIAGNOSIS — I509 Heart failure, unspecified: Secondary | ICD-10-CM

## 2018-04-11 DIAGNOSIS — I340 Nonrheumatic mitral (valve) insufficiency: Secondary | ICD-10-CM | POA: Diagnosis not present

## 2018-04-11 LAB — RESPIRATORY PANEL BY PCR
Adenovirus: NOT DETECTED
Bordetella pertussis: NOT DETECTED
Chlamydophila pneumoniae: NOT DETECTED
Coronavirus 229E: NOT DETECTED
Coronavirus HKU1: NOT DETECTED
Coronavirus NL63: NOT DETECTED
Coronavirus OC43: NOT DETECTED
Influenza A: NOT DETECTED
Influenza B: NOT DETECTED
Metapneumovirus: NOT DETECTED
Mycoplasma pneumoniae: NOT DETECTED
Parainfluenza Virus 1: NOT DETECTED
Parainfluenza Virus 2: NOT DETECTED
Parainfluenza Virus 3: NOT DETECTED
Parainfluenza Virus 4: NOT DETECTED
RHINOVIRUS / ENTEROVIRUS - RVPPCR: NOT DETECTED
Respiratory Syncytial Virus: NOT DETECTED

## 2018-04-11 LAB — POCT I-STAT EG7
Acid-Base Excess: 4 mmol/L — ABNORMAL HIGH (ref 0.0–2.0)
BICARBONATE: 28.2 mmol/L — AB (ref 20.0–28.0)
Calcium, Ion: 1.18 mmol/L (ref 1.15–1.40)
HCT: 28 % — ABNORMAL LOW (ref 36.0–46.0)
Hemoglobin: 9.5 g/dL — ABNORMAL LOW (ref 12.0–15.0)
O2 Saturation: 100 %
Potassium: 4.1 mmol/L (ref 3.5–5.1)
Sodium: 135 mmol/L (ref 135–145)
TCO2: 29 mmol/L (ref 22–32)
pCO2, Ven: 40.5 mmHg — ABNORMAL LOW (ref 44.0–60.0)
pH, Ven: 7.45 — ABNORMAL HIGH (ref 7.250–7.430)
pO2, Ven: 201 mmHg — ABNORMAL HIGH (ref 32.0–45.0)

## 2018-04-11 LAB — COMPREHENSIVE METABOLIC PANEL
ALT: 15 U/L (ref 0–44)
AST: 33 U/L (ref 15–41)
Albumin: 2.1 g/dL — ABNORMAL LOW (ref 3.5–5.0)
Alkaline Phosphatase: 57 U/L (ref 38–126)
Anion gap: 7 (ref 5–15)
BUN: 17 mg/dL (ref 8–23)
CO2: 26 mmol/L (ref 22–32)
Calcium: 8.5 mg/dL — ABNORMAL LOW (ref 8.9–10.3)
Chloride: 100 mmol/L (ref 98–111)
Creatinine, Ser: 1.14 mg/dL — ABNORMAL HIGH (ref 0.44–1.00)
GFR calc Af Amer: 57 mL/min — ABNORMAL LOW (ref >60)
GFR calc non Af Amer: 49 mL/min — ABNORMAL LOW (ref >60)
Glucose, Bld: 101 mg/dL — ABNORMAL HIGH (ref 70–99)
Potassium: 4.2 mmol/L (ref 3.5–5.1)
Sodium: 133 mmol/L — ABNORMAL LOW (ref 135–145)
Total Bilirubin: 0.7 mg/dL (ref 0.3–1.2)
Total Protein: 6.5 g/dL (ref 6.5–8.1)

## 2018-04-11 LAB — URINALYSIS, ROUTINE W REFLEX MICROSCOPIC
Bilirubin Urine: NEGATIVE
Glucose, UA: NEGATIVE mg/dL
Hgb urine dipstick: NEGATIVE
Ketones, ur: NEGATIVE mg/dL
Leukocytes, UA: NEGATIVE
Nitrite: NEGATIVE
Protein, ur: NEGATIVE mg/dL
Specific Gravity, Urine: 1.025 (ref 1.005–1.030)
pH: 5 (ref 5.0–8.0)

## 2018-04-11 LAB — MRSA PCR SCREENING: MRSA by PCR: NEGATIVE

## 2018-04-11 LAB — I-STAT TROPONIN, ED: Troponin i, poc: 0 ng/mL (ref 0.00–0.08)

## 2018-04-11 LAB — PROCALCITONIN: Procalcitonin: 0.35 ng/mL

## 2018-04-11 LAB — LACTIC ACID, PLASMA
Lactic Acid, Venous: 1.3 mmol/L (ref 0.5–1.9)
Lactic Acid, Venous: 1.4 mmol/L (ref 0.5–1.9)

## 2018-04-11 LAB — BRAIN NATRIURETIC PEPTIDE: B Natriuretic Peptide: 512.3 pg/mL — ABNORMAL HIGH (ref 0.0–100.0)

## 2018-04-11 MED ORDER — ACETAMINOPHEN 325 MG PO TABS: 650.0000 mg | ORAL_TABLET | Freq: Four times a day (QID) | ORAL | Status: DC | PRN

## 2018-04-11 MED ORDER — SODIUM CHLORIDE 0.9 % IV SOLN
2.0000 g | Freq: Once | INTRAVENOUS | Status: AC
  Administered 2018-04-11: 2 g via INTRAVENOUS
  Filled 2018-04-11: qty 2

## 2018-04-11 MED ORDER — ENOXAPARIN SODIUM 40 MG/0.4ML ~~LOC~~ SOLN
40.0000 mg | SUBCUTANEOUS | Status: DC
  Administered 2018-04-11 – 2018-04-17 (×7): 40 mg via SUBCUTANEOUS
  Filled 2018-04-11 (×8): qty 0.4

## 2018-04-11 MED ORDER — VANCOMYCIN HCL IN DEXTROSE 1-5 GM/200ML-% IV SOLN: 1000.0000 mg | Freq: Once | INTRAVENOUS | Status: DC

## 2018-04-11 MED ORDER — SODIUM CHLORIDE 0.9 % IV SOLN
1.0000 g | Freq: Three times a day (TID) | INTRAVENOUS | Status: DC
  Administered 2018-04-11 – 2018-04-12 (×2): 1 g via INTRAVENOUS
  Filled 2018-04-11 (×3): qty 1

## 2018-04-11 MED ORDER — VANCOMYCIN HCL IN DEXTROSE 750-5 MG/150ML-% IV SOLN: 750.0000 mg | INTRAVENOUS | Status: DC

## 2018-04-11 MED ORDER — FLUOXETINE HCL 10 MG PO CAPS
30.0000 mg | ORAL_CAPSULE | Freq: Every day | ORAL | Status: DC
  Administered 2018-04-12: 30 mg via ORAL
  Filled 2018-04-11 (×3): qty 3

## 2018-04-11 MED ORDER — CLONAZEPAM 1 MG PO TABS
1.0000 mg | ORAL_TABLET | Freq: Two times a day (BID) | ORAL | Status: DC
  Administered 2018-04-11 – 2018-04-13 (×3): 1 mg via ORAL
  Filled 2018-04-11 (×3): qty 1

## 2018-04-11 MED ORDER — ONDANSETRON HCL 4 MG/2ML IJ SOLN: 4.0000 mg | Freq: Four times a day (QID) | INTRAMUSCULAR | Status: DC | PRN

## 2018-04-11 MED ORDER — ENSURE ENLIVE PO LIQD
237.0000 mL | Freq: Two times a day (BID) | ORAL | Status: DC
  Administered 2018-04-11 – 2018-04-12 (×3): 237 mL via ORAL

## 2018-04-11 MED ORDER — POLYETHYLENE GLYCOL 3350 17 G PO PACK
17.0000 g | PACK | Freq: Every evening | ORAL | Status: DC
  Administered 2018-04-11 – 2018-04-12 (×2): 17 g via ORAL
  Filled 2018-04-11 (×3): qty 1

## 2018-04-11 MED ORDER — SENNOSIDES-DOCUSATE SODIUM 8.6-50 MG PO TABS
1.0000 | ORAL_TABLET | Freq: Two times a day (BID) | ORAL | Status: DC
  Filled 2018-04-11 (×2): qty 1

## 2018-04-11 MED ORDER — ASPIRIN 81 MG PO TABS: 81.0000 mg | ORAL_TABLET | Freq: Every day | ORAL | Status: DC

## 2018-04-11 MED ORDER — SODIUM CHLORIDE 0.9 % IV SOLN
1000.0000 mL | INTRAVENOUS | Status: DC
  Administered 2018-04-11: 1000 mL via INTRAVENOUS

## 2018-04-11 MED ORDER — PANTOPRAZOLE SODIUM 40 MG PO TBEC
40.0000 mg | DELAYED_RELEASE_TABLET | Freq: Every day | ORAL | Status: DC
  Administered 2018-04-12: 40 mg via ORAL
  Filled 2018-04-11: qty 1

## 2018-04-11 MED ORDER — MOMETASONE FURO-FORMOTEROL FUM 200-5 MCG/ACT IN AERO
2.0000 | INHALATION_SPRAY | Freq: Two times a day (BID) | RESPIRATORY_TRACT | Status: DC
  Administered 2018-04-11 – 2018-04-12 (×3): 2 via RESPIRATORY_TRACT
  Filled 2018-04-11: qty 8.8

## 2018-04-11 MED ORDER — ALBUTEROL SULFATE (2.5 MG/3ML) 0.083% IN NEBU
2.5000 mg | INHALATION_SOLUTION | Freq: Four times a day (QID) | RESPIRATORY_TRACT | Status: DC | PRN
  Administered 2018-04-12: 2.5 mg via RESPIRATORY_TRACT
  Filled 2018-04-11: qty 3

## 2018-04-11 MED ORDER — VANCOMYCIN HCL 10 G IV SOLR
1500.0000 mg | Freq: Once | INTRAVENOUS | Status: AC
  Administered 2018-04-11: 1500 mg via INTRAVENOUS
  Filled 2018-04-11: qty 1500

## 2018-04-11 MED ORDER — ASPIRIN 81 MG PO CHEW
81.0000 mg | CHEWABLE_TABLET | Freq: Every day | ORAL | Status: DC
  Administered 2018-04-12: 81 mg via ORAL
  Filled 2018-04-11: qty 1

## 2018-04-11 MED ORDER — GUAIFENESIN ER 600 MG PO TB12
600.0000 mg | ORAL_TABLET | Freq: Two times a day (BID) | ORAL | Status: DC
  Administered 2018-04-11 – 2018-04-12 (×2): 600 mg via ORAL
  Filled 2018-04-11 (×3): qty 1

## 2018-04-11 MED ORDER — ACETAMINOPHEN 650 MG RE SUPP: 650.0000 mg | Freq: Four times a day (QID) | RECTAL | Status: DC | PRN

## 2018-04-11 MED ORDER — ONDANSETRON HCL 4 MG PO TABS: 4.0000 mg | ORAL_TABLET | Freq: Four times a day (QID) | ORAL | Status: DC | PRN

## 2018-04-11 MED ORDER — POTASSIUM CHLORIDE CRYS ER 20 MEQ PO TBCR
20.0000 meq | EXTENDED_RELEASE_TABLET | ORAL | Status: DC
  Administered 2018-04-15 – 2018-04-17 (×2): 20 meq via ORAL
  Filled 2018-04-11 (×2): qty 1

## 2018-04-11 MED ORDER — SENNOSIDES-DOCUSATE SODIUM 8.6-50 MG PO TABS
1.0000 | ORAL_TABLET | Freq: Two times a day (BID) | ORAL | Status: DC
  Administered 2018-04-11 – 2018-04-13 (×2): 1 via ORAL
  Filled 2018-04-11 (×3): qty 1

## 2018-04-11 MED ORDER — FLUOXETINE HCL 10 MG PO CAPS: 10.0000 mg | ORAL_CAPSULE | Freq: Every day | ORAL | Status: DC

## 2018-04-11 NOTE — ED Notes (Signed)
Attempted report 

## 2018-04-11 NOTE — H&P (Signed)
Date: 03/28/2018               Patient Name:  [name] FallJudy [name] MRN: [id number]  DOB: 1950/03/30 Age / Sex: 68 y.o., female   PCP: Ronal FearLam, Lynn E, NP         Medical Service: Internal Medicine Teaching Service         Attending Physician: Dr. Sandre Kittyaines, Elwin MochaAlexander N, MD    First Contact: Dr. Karilyn Cotaehman Pager: [phone]  Second Contact: Dr. Caron PresumeHelberg Pager: [phone]       After Hours (After 5p/  First Contact Pager: [phone]  weekends / holidays): Second Contact Pager: [phone]   Chief Complaint: Shortness of breath   History of Present Illness: Ms. [name] [name] is a 68 yo female with COPD, diastolic heart failure, GERD, depression and anxiety presenting for worsening shortness of breath. History was obtained via the patient but she appears altered and she seems of have some cognitive impairment.   Per patient, yesterday she experienced acute onset SOB and nonproductive cough. She reports a high fever at home but otherwise is not sure if she has had one. Reports being told in the past that she has COPD but no other lungs diseases. She tells us that she does not wear oxygen at home but per chart review it appears that she is on home oxygen. Reports not taking any medications aside from some inhalers. Reports that she lives with her son and her husband died in June 2019. She is typically able to do all ADLs independently. States that she quit smoking several years ago.   Per [facility], at baseline she is alert and oriented x4 and able to make intelligent choices. She is legally blind but can see shadows. At her baseline she is able to perform her ADL's until recently when on exertion her oxygen saturations were dropping below 80% with 2L supplemental oxygen so she was increased to 4L. She normally saturates above 90% on 2L. Today, her RN found her with rapid, shallow breathing, a fever of 102, tachycardia 115 and O2 saturation of 53%. Her oxygen was increased from 2L to 4L and was given a nebulizer  treatment. O2 saturation went up to 64% and then 96%. She was seen in the ED on 1/16 with similar symptoms and was started on Levaquin. Prior to that, in December she was diagnosed with pneumonia and treated with rocephin and azithromycin. She also recently complained that her vision in her left eye was going completely black and then light. She was evaluated by an opthalmologist but not found to have any problems. She has been having issues with her son who is her current health POA and she is working to have a family friend become her new healthcare POA.   ED course: On arrival, she was tachypneic and hypotensive. She was hypoxic on arrival and placed on 6L supplemental oxygen and saturating at 96%. Lactic acid was 1.3. CMP was unremarkable. WBC 13.4. Chest xray showed diffuse hazy and patchy opacity in both lungs, worsened compared to prior exam.   Meds:  Current Meds  Medication Sig  . aspirin 81 MG tablet Take 81 mg by mouth daily.  . clonazePAM (KLONOPIN) 1 MG tablet Take 1 tablet (1 mg total) by mouth 2 (two) times daily.  . Coenzyme Q10 (COQ10) 200 MG CAPS Take 200 mg by mouth daily.   . feeding supplement, ENSURE ENLIVE, (ENSURE ENLIVE) LIQD Take 237 mLs by mouth 2 (two) times daily between meals.  .Marland Kitchen  FLUoxetine (PROZAC) 10 MG capsule Take 10 mg by mouth daily. Take with 20 mg for a total of 30 mgn  . FLUoxetine (PROZAC) 20 MG capsule Take 20 mg by mouth daily. Give with 10 mg to have a total of 30 mg  . Fluticasone-Salmeterol (ADVAIR) 250-50 MCG/DOSE AEPB Inhale 1 puff into the lungs 2 (two) times daily.  Marland Kitchen guaiFENesin (MUCINEX) 600 MG 12 hr tablet Take 600 mg by mouth 2 (two) times daily.  . Iron-FA-B Cmp-C-Biot-Probiotic (FUSION PLUS PO) Take 1 capsule by mouth daily.  Marland Kitchen levofloxacin (LEVAQUIN) 750 MG tablet Take 750 mg by mouth every evening.   . lidocaine (LIDODERM) 5 % Place 1 patch onto the skin daily. Remove & Discard patch within 12 hours or as directed by MD  . pantoprazole  (PROTONIX) 40 MG tablet Take 1 tablet (40 mg total) by mouth daily.  . polyethylene glycol (MIRALAX / GLYCOLAX) packet Take 17 g by mouth every evening.  . potassium chloride SA (K-DUR,KLOR-CON) 20 MEQ tablet Take 20 mEq by mouth every other day.   . predniSONE (DELTASONE) 10 MG tablet 40 mg oral daily x2 days, then 30 mg oral daily x2 days, then 20 mg oral daily x2 days, then 10 mg oral daily x2 days, then stop (Patient taking differently: Take 20 mg by mouth daily. )  . sennosides-docusate sodium (SENOKOT-S) 8.6-50 MG tablet Take 1 tablet by mouth 2 (two) times daily.     Allergies: Allergies as of 05-05-18 - Review Complete 05/05/18  Allergen Reaction Noted  . Codeine Other (See Comments) 09/27/2014   Past Medical History:  Diagnosis Date  . Acute renal failure (HCC)   . Anemia   . Angina   . Anxiety   . Arthritis    "right knee" (02/28/2018)  . CAP (community acquired pneumonia) 02/28/2018  . CHF (congestive heart failure) (HCC)   . COPD (chronic obstructive pulmonary disease) (HCC)   . Degenerative disorder of eye    Right eye  . Depression   . Dizziness   . Fluid retention   . GERD (gastroesophageal reflux disease)   . History of hiatal hernia   . Hypertension   . Pneumonia    "get pneumonia 2-3 times q yr" (02/28/2018)  . Shortness of breath     Family History:  Family History  Problem Relation Age of Onset  . Heart failure Mother   . Stroke Father    Social History:  Patient reported she lives with her son, however she lives at a facility. She also reports she is able to perform most of her ADL's. Denies supplemental oxygen use however per notes from her facility she is on 4L. Has a history of tobacco use, started smoking at the age of 75 and quit in 2006.  Social History   Socioeconomic History  . Marital status: Widowed    Spouse name: Not on file  . Number of children: Not on file  . Years of education: Not on file  . Highest education level: Not on  file  Occupational History  . Not on file  Social Needs  . Financial resource strain: Not on file  . Food insecurity:    Worry: Not on file    Inability: Not on file  . Transportation needs:    Medical: Not on file    Non-medical: Not on file  Tobacco Use  . Smoking status: Former Smoker    Packs/day: 0.10    Years: 30.00    Pack  years: 3.00    Last attempt to quit: 2002    Years since quitting: 18.0  . Smokeless tobacco: Current User    Types: Snuff  Substance and Sexual Activity  . Alcohol use: Never    Frequency: Never  . Drug use: Never  . Sexual activity: Not on file  Lifestyle  . Physical activity:    Days per week: Not on file    Minutes per session: Not on file  . Stress: Not on file  Relationships  . Social connections:    Talks on phone: Not on file    Gets together: Not on file    Attends religious service: Not on file    Active member of club or organization: Not on file    Attends meetings of clubs or organizations: Not on file    Relationship status: Not on file  . Intimate partner violence:    Fear of current or ex partner: Not on file    Emotionally abused: Not on file    Physically abused: Not on file    Forced sexual activity: Not on file  Other Topics Concern  . Not on file  Social History Narrative  . Not on file    Review of Systems: A complete ROS was negative except as per HPI.   Physical Exam: Blood pressure 102/60, pulse 77, temperature 98 F (36.7 C), temperature source Oral, resp. rate (!) 21, height 5\' 5"  (1.651 m), weight 88.5 kg, SpO2 100 %.  Physical Exam  Constitutional: She is well-developed, well-nourished, and in no distress.  HENT:  Patient only has teeth on the bottom portion of her mouth  Cardiovascular: Normal rate, regular rhythm and normal heart sounds.  No murmur heard. Pulmonary/Chest: Effort normal. No respiratory distress. She has no wheezes.  Diffuse inspiratory crackles, no wheezing  Abdominal: Soft. Bowel  sounds are normal. She exhibits no distension. There is no abdominal tenderness.  Musculoskeletal:        General: Edema present.     Comments: Non-pitting edema in bilateral LE  Neurological:  Alert, not oriented to time  Skin: Skin is warm and dry.  Psychiatric: Mood normal.    EKG: personally reviewed my interpretation is sinus rhythm  CXR: personally reviewed my interpretation is diffuse and hazy, question of pulmonary edema, no focal opacity appreciated   Assessment & Plan by Problem: Active Problems:   Acute on chronic respiratory failure with hypoxia [facility](HCC)  Ms. [name] [name] is a 68 yo female with COPD, diastolic heart failure, GERD, depression and anxiety presenting for worsening shortness of breath. She was found to be hypoxic but saturating well on 4L supplemental oxygen.   Acute on chronic respiratory failure with hypoxia: Patient has a history of COPD, no spirometry records. She is on 2L supplemental oxygen at baseline. She has been diagnosed and treated for pneumonia 3 times in the past month. CXR shows diffuse and hazy patchy ?opacity in both lungs, worsened from priors. 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 There is concern for an underlying autoimmune disease and possible ILD. There is also a possibility this is worsening heart failure. - Continue cefepime and vancomycin for PNA - Will consider a high resolution CT scan  - Follow up BNP and Procalcitonin - Follow up respiratory panel  - Spirometry  - Continue supplemental oxygen - Continuous pulse ox - Goal SpO2 > 90% -  Continue Dulera bid, albuterol nebulizer as needed  - Mucinex 600 mg bid  - Cardiac monitoring   Diastolic HF: Last echo was in 1552, LV EF 55-60%, normal systolic function, no regional wall abnormalities, mild AV and MV regurgitation, pulmonary artery pressures mildly increased and grade 1 diastolic  dysfunction - Will follow up BNP and possibly repeat an echo to evaluate for worsening HF  GERD:Continue pantoprazole 40 mg daily  Depression: Continue fluoxetine 30 mg daily, Anxiety: Continue clonazepam 1 mg bid  Moderate protein-calorie malnutrition: Continue feeding supplements   Diet: DYS 3  DVT prophylaxis: Lovenox Full code   Dispo: Admit patient to Inpatient with expected length of stay greater than 2 midnights.  SignedJaci Standard, DO 05-07-18, 2:49 PM  Pager: [phone]

## 2018-04-11 NOTE — ED Provider Notes (Signed)
[facility] EMERGENCY DEPARTMENT Provider Note   CSN: [id number] Arrival date & time: 04/12/2018  1041     History   Chief Complaint Chief Complaint  Patient presents with  . Respiratory Distress  . Code Sepsis    HPI [name] [name] is a 68 y.o. female with history of hypertension, COPD, CHF who presents from [facility] rehabilitation with shortness of breath, fever, oxygen requirement.  Patient was seen by nursing staff after the patient reported that she could not breathe.  Her oxygen saturation was 54%.  She was given a breathing treatment.  Patient was seen by provider and she was found to be febrile at 102.  Unsure if patient received any Tylenol, however her temp is decreased.  Patient was brought in by EMS on nonrebreather.  Patient reports she has been coughing up phlegm.  She denies any chest pain, abdominal pain, nausea, vomiting, urinary symptoms.  She has been given Levaquin for the past few days.  According to paperwork, it seems patient requires 2 L of oxygen at this time.  Patient reports she had not required oxygen prior, except for many years ago.  HPI  Past Medical History:  Diagnosis Date  . Acute renal failure (HCC)   . Anemia   . Angina   . Anxiety   . Arthritis    "right knee" (02/28/2018)  . CAP (community acquired pneumonia) 02/28/2018  . CHF (congestive heart failure) (HCC)   . COPD (chronic obstructive pulmonary disease) (HCC)   . Degenerative disorder of eye    Right eye  . Depression   . Dizziness   . Fluid retention   . GERD (gastroesophageal reflux disease)   . History of hiatal hernia   . Hypertension   . Pneumonia    "get pneumonia 2-3 times q yr" (02/28/2018)  . Shortness of breath     Patient Active Problem List   Diagnosis Date Noted  . Malnutrition of moderate degree 03/06/2018  . Severe protein-calorie malnutrition (HCC) 03/03/2018  . CAP (community acquired pneumonia) 02/28/2018  . Dyspnea 02/28/2018  . Anxiety  02/28/2018  . COPD with acute exacerbation (HCC) 02/28/2018  . CHF (congestive heart failure) (HCC)   . Generalized weakness 11/05/2011  . Anemia 11/05/2011  . COPD (chronic obstructive pulmonary disease) (HCC) 11/05/2011  . Dysphagia 08/20/2011  . Abnormal CT scan, chest 08/17/2011    Past Surgical History:  Procedure Laterality Date  . FRACTURE SURGERY    . LAPAROSCOPIC CHOLECYSTECTOMY    . PATELLA FRACTURE SURGERY Right   . TUBAL LIGATION       OB History   No obstetric history on file.      Home Medications    Prior to Admission medications   Medication Sig Start Date End Date Taking? Authorizing Provider  aspirin 81 MG tablet Take 81 mg by mouth daily.   Yes [provider]  clonazePAM (KLONOPIN) 1 MG tablet Take 1 tablet (1 mg total) by mouth 2 (two) times daily. 03/06/18  Yes Elgergawy, Leana Roe, MD  Coenzyme Q10 (COQ10) 200 MG CAPS Take 200 mg by mouth daily.    Yes [provider]  feeding supplement, ENSURE ENLIVE, (ENSURE ENLIVE) LIQD Take 237 mLs by mouth 2 (two) times daily between meals. 03/06/18  Yes Elgergawy, Leana Roe, MD  FLUoxetine (PROZAC) 10 MG capsule Take 10 mg by mouth daily. Take with 20 mg for a total of 30 mgn 04/09/18  Yes [provider]  FLUoxetine (PROZAC) 20  MG capsule Take 20 mg by mouth daily. Give with 10 mg to have a total of 30 mg   Yes [provider]  Fluticasone-Salmeterol (ADVAIR) 250-50 MCG/DOSE AEPB Inhale 1 puff into the lungs 2 (two) times daily. 11/07/11  Yes Larey SeatKesty, Jenna M, MD  guaiFENesin (MUCINEX) 600 MG 12 hr tablet Take 600 mg by mouth 2 (two) times daily.   Yes [provider]  Iron-FA-B Cmp-C-Biot-Probiotic (FUSION PLUS PO) Take 1 capsule by mouth daily.   Yes [provider]  levofloxacin (LEVAQUIN) 750 MG tablet Take 750 mg by mouth every evening.  04/07/18  Yes [provider]  lidocaine (LIDODERM) 5 % Place 1 patch onto the skin daily. Remove & Discard patch  within 12 hours or as directed by MD 11/17/17  Yes Joy, Shawn C, PA-C  pantoprazole (PROTONIX) 40 MG tablet Take 1 tablet (40 mg total) by mouth daily. 03/06/18  Yes Elgergawy, Leana Roeawood S, MD  polyethylene glycol (MIRALAX / GLYCOLAX) packet Take 17 g by mouth every evening.   Yes [provider]  potassium chloride SA (K-DUR,KLOR-CON) 20 MEQ tablet Take 20 mEq by mouth every other day.    Yes [provider]  predniSONE (DELTASONE) 10 MG tablet 40 mg oral daily x2 days, then 30 mg oral daily x2 days, then 20 mg oral daily x2 days, then 10 mg oral daily x2 days, then stop Patient taking differently: Take 20 mg by mouth daily.  03/06/18  Yes Elgergawy, Leana Roeawood S, MD  sennosides-docusate sodium (SENOKOT-S) 8.6-50 MG tablet Take 1 tablet by mouth 2 (two) times daily.   Yes [provider]  albuterol (PROVENTIL) (2.5 MG/3ML) 0.083% nebulizer solution Take 3 mLs (2.5 mg total) by nebulization every 4 (four) hours as needed for wheezing or shortness of breath. Patient not taking: Reported on 01/20/19 03/06/18   Elgergawy, Leana Roeawood S, MD  calcium-vitamin D (OSCAL) 250-125 MG-UNIT per tablet Take 1 tablet by mouth daily. 11/07/11 02/28/18  Larey SeatKesty, Jenna M, MD  cefdinir (OMNICEF) 300 MG capsule Take 1 capsule (300 mg total) by mouth 2 (two) times daily. Patient not taking: Reported on 01/20/19 04/03/18   Cathren LaineSteinl, Kevin, MD  ipratropium-albuterol (DUONEB) 0.5-2.5 (3) MG/3ML SOLN Take 3 mLs by nebulization every 6 (six) hours. Patient not taking: Reported on 01/20/19 03/06/18   Elgergawy, Leana Roeawood S, MD    Family History Family History  Problem Relation Age of Onset  . Heart failure Mother   . Stroke Father     Social History Social History   Tobacco Use  . Smoking status: Former Smoker    Packs/day: 0.10    Years: 30.00    Pack years: 3.00    Last attempt to quit: 2002    Years since quitting: 18.0  . Smokeless tobacco: Current User    Types: Snuff  Substance Use Topics  .  Alcohol use: Never    Frequency: Never  . Drug use: Never     Allergies   Codeine   Review of Systems Review of Systems  Constitutional: Positive for fatigue and fever. Negative for chills.  HENT: Negative for facial swelling and sore throat.   Respiratory: Positive for cough and shortness of breath.   Cardiovascular: Negative for chest pain.  Gastrointestinal: Negative for abdominal pain, nausea and vomiting.  Genitourinary: Negative for dysuria.  Musculoskeletal: Negative for back pain.  Skin: Negative for rash and wound.  Neurological: Negative for headaches.  Psychiatric/Behavioral: The patient is not nervous/anxious.  Physical Exam Updated Vital Signs BP 103/60   Pulse 82   Temp 98 F (36.7 C) (Oral)   Resp 17   Ht 5\' 5"  (1.651 m)   Wt 88.5 kg   SpO2 96%   BMI 32.45 kg/m   Physical Exam Vitals signs and nursing note reviewed.  Constitutional:      General: She is not in acute distress.    Appearance: She is well-developed. She is not diaphoretic.  HENT:     Head: Normocephalic and atraumatic.     Mouth/Throat:     Pharynx: No oropharyngeal exudate.  Eyes:     General: No scleral icterus.       Right eye: No discharge.        Left eye: No discharge.     Conjunctiva/sclera: Conjunctivae normal.     Pupils: Pupils are equal, round, and reactive to light.  Neck:     Musculoskeletal: Normal range of motion and neck supple.     Thyroid: No thyromegaly.  Cardiovascular:     Rate and Rhythm: Normal rate and regular rhythm.     Heart sounds: Normal heart sounds. No murmur. No friction rub. No gallop.   Pulmonary:     Effort: Pulmonary effort is normal. No respiratory distress.     Breath sounds: No stridor. Decreased breath sounds and rales (L>R) present. No wheezing.     Comments: 68% on RA, increased to 96% on 6L Abdominal:     General: Bowel sounds are normal. There is no distension.     Palpations: Abdomen is soft.     Tenderness: There is no  abdominal tenderness. There is no guarding or rebound.  Musculoskeletal:     Comments: 1+ bilaterally lower extremity edema  Lymphadenopathy:     Cervical: No cervical adenopathy.  Skin:    General: Skin is warm and dry.     Coloration: Skin is not pale.     Findings: No rash.  Neurological:     Mental Status: She is alert.     Coordination: Coordination normal.      ED Treatments / Results  Labs (all labs ordered are listed, but only abnormal results are displayed) Labs Reviewed  COMPREHENSIVE METABOLIC PANEL - Abnormal; Notable for the following components:      Result Value   Sodium 133 (*)    Glucose, Bld 101 (*)    Creatinine, Ser 1.14 (*)    Calcium 8.5 (*)    Albumin 2.1 (*)    GFR calc non Af Amer 49 (*)    GFR calc Af Amer 57 (*)    All other components within normal limits  CBC WITH DIFFERENTIAL/PLATELET - Abnormal; Notable for the following components:   WBC 13.4 (*)    RBC 2.90 (*)    Hemoglobin 9.3 (*)    HCT 32.9 (*)    MCV 113.4 (*)    MCHC 28.3 (*)    Neutro Abs 12.2 (*)    Abs Immature Granulocytes 0.10 (*)    All other components within normal limits  POCT I-STAT EG7 - Abnormal; Notable for the following components:   pH, Ven 7.450 (*)    pCO2, Ven 40.5 (*)    pO2, Ven 201.0 (*)    Bicarbonate 28.2 (*)    Acid-Base Excess 4.0 (*)    HCT 28.0 (*)    Hemoglobin 9.5 (*)    All other components within normal limits  CULTURE, BLOOD (ROUTINE X 2)  CULTURE, BLOOD (  ROUTINE X 2)  LACTIC ACID, PLASMA  LACTIC ACID, PLASMA  URINALYSIS, ROUTINE W REFLEX MICROSCOPIC  I-STAT TROPONIN, ED  I-STAT VENOUS BLOOD GAS, ED    EKG EKG Interpretation  Date/Time:  Friday April 11 2018 11:11:28 EST Ventricular Rate:  101 PR Interval:    QRS Duration: 94 QT Interval:  344 QTC Calculation: 446 R Axis:   82 Text Interpretation:  Sinus tachycardia Borderline right axis deviation No significant change since last tracing Confirmed by Gwyneth Sprout ([id number])  on 03/22/2018 11:20:33 AM Also confirmed by Gwyneth Sprout ([id number]), editor Josephine Igo [phone])  on 03/25/2018 12:30:03 PM   Radiology Dg Chest Port 1 View  Result Date: 04/08/2018 CLINICAL DATA:  Recent diagnosis of pneumonia with shortness of breath. EXAM: PORTABLE CHEST 1 VIEW COMPARISON:  April 03, 2018 FINDINGS: The heart size and mediastinal contours are stable. Diffuse hazy opacities identified throughout bilateral lungs, worsened compared prior exam. There are small bilateral pleural effusions. The visualized skeletal structures are unremarkable. IMPRESSION: Diffuse hazy and patchy opacity identified in both lungs worsened compared to prior exam. Electronically Signed   By: Sherian Rein M.D.   On: 04/10/2018 11:18    Procedures Procedures (including critical care time)  Medications Ordered in ED Medications  0.9 %  sodium chloride infusion (1,000 mLs Intravenous New Bag/Given 03/24/2018 1153)  vancomycin (VANCOCIN) 1,500 mg in sodium chloride 0.9 % 500 mL IVPB (1,500 mg Intravenous New Bag/Given 04/04/2018 1318)  vancomycin (VANCOCIN) IVPB 750 mg/150 ml premix (has no administration in time range)  ceFEPIme (MAXIPIME) 1 g in sodium chloride 0.9 % 100 mL IVPB (has no administration in time range)  ceFEPIme (MAXIPIME) 2 g in sodium chloride 0.9 % 100 mL IVPB (0 g Intravenous Stopped 04/07/2018 1223)     Initial Impression / Assessment and Plan / ED Course  I have reviewed the triage vital signs and the nursing notes.  Pertinent labs & imaging results that were available during my care of the patient were reviewed by me and considered in my medical decision making (see chart for details).     Patient presenting with HCAP Worsening on Levaquin.  Patient found to be hypoxic at her rehab facility today despite 2 L of oxygen.  Patient arrived on nonrebreather, however was able to transition to 6 L nasal cannula with comfort.  Chest x-ray shows diffuse hazy and patchy opacity identified  in both lungs worsened compared to prior exam on 04/03/2018.  Leukocytosis at 13.4.  Stable chronic anemia at 9.3.  Lactic is negative.  Code sepsis called on initial examination.  Considering lactic negative and CHF history of fluid overload, fluid has been gentle.  HCAP antibiotics vancomycin and cefepime, initiated.  VBG shows venous pH at 7.45, PCO2 40.5, PO2 201, bicarb 28.2.  I discussed patient case with internal medicine teaching service who accepts patient for admission.  Appreciate their assistance with the patient.  Patient also evaluated by my attending, Dr. Anitra Lauth, who guided the patient's management and agrees with plan.  Final Clinical Impressions(s) / ED Diagnoses   Final diagnoses:  HCAP (healthcare-associated pneumonia)    ED Discharge Orders    None       Emi Holes, PA-C 04/10/2018 1340    Gwyneth Sprout, MD 04/16/18 1709

## 2018-04-11 NOTE — Patient Outreach (Signed)
[facility]) Care Management  04/18/2018  [name] [name] 12-28-1950 [id number]   Subjective:Case discussed with Post Acute Care Coordinator  [name] [name]) at [facility] Care Management.   Per [facility] notification patient admitted to hospital today.      Objective:Per KPN (Knowledge Performance Now, point of care tool) and chart review,patient hospitalized 02/28/18-03/06/18 forCommunity acquired pneumonia of left lung, patient discharged from hospital to [facility]. Patient had ED visit on 04/03/2018 for shortness of breath. Patient discharged from [place] on 04/04/2018. Patient also has a history of hypertension, COPD, congestive heart failure, malnutrition, and home oxygen at 4 liters.     Assessment: Received [employer] Transition of care referral on 04/08/2018. Transition of care follow up pending hospital discharge.       Plan:RNCM will call patient for  telephone outreach attempt, transition of care follow up, within 3 business days of hospital discharge notification.       [name] [name] H. Gardiner Barefoot, BSN, CCM [facility] Care Management [facility] Telephonic CM Phone: [phone] Fax: [phone]

## 2018-04-11 NOTE — Progress Notes (Signed)
Pharmacy Antibiotic Note  [name] [name] is a 68 y.o. female admitted on 03/29/2018 with pneumonia. Pharmacy has been consulted for vancomycin and cefepime dosing. Pt is afebrile but WBC is elevated at 13.4. SCr is 1.14 and lactic acid is WNL.   Plan: Vancomycin 1500mg  IV x 1 then 750mg  IV Q24H Cefepime 2gm IV x 1 then 1gm IV Q8H F/u renal fxn, C&S, clinical status and peak/trough at SS  Height: 5\' 5"  (165.1 cm) Weight: 195 lb (88.5 kg) IBW/kg (Calculated) : 57  Temp (24hrs), Avg:98 F (36.7 C), Min:98 F (36.7 C), Max:98 F (36.7 C)  No results for input(s): WBC, CREATININE, LATICACIDVEN, VANCOTROUGH, VANCOPEAK, VANCORANDOM, GENTTROUGH, GENTPEAK, GENTRANDOM, TOBRATROUGH, TOBRAPEAK, TOBRARND, AMIKACINPEAK, AMIKACINTROU, AMIKACIN in the last 168 hours.  Estimated Creatinine Clearance: 49.9 mL/min (A) (by C-G formula based on SCr of 1.17 mg/dL (H)).    Allergies  Allergen Reactions  . Codeine Other (See Comments)    Feel Numb.     Antimicrobials this admission: Vanc 1/24>> Cefepime 1/24>>  Dose adjustments this admission: N/A  Microbiology results: Pending  Thank you for allowing pharmacy to be a part of this patient's care.  Bellany Elbaum, Drake Leach 03/29/2018 11:10 AM

## 2018-04-11 NOTE — ED Triage Notes (Signed)
Pt arrives to ED from [facility] with complaints of respiratory distress since three days ago. EMS reports pt ws 68% on 2L when fire got there. Pt 67% on room air upon arrival, remains alert and oriented. Pt diagnosed with pneumonia 2 days ago and is still short of breath. Pt placed in position of comfort with bed locked and lowered, call bell in reach.

## 2018-04-12 ENCOUNTER — Inpatient Hospital Stay (HOSPITAL_COMMUNITY): Payer: Medicare HMO

## 2018-04-12 DIAGNOSIS — L8990 Pressure ulcer of unspecified site, unspecified stage: Secondary | ICD-10-CM

## 2018-04-12 DIAGNOSIS — K219 Gastro-esophageal reflux disease without esophagitis: Secondary | ICD-10-CM

## 2018-04-12 DIAGNOSIS — I361 Nonrheumatic tricuspid (valve) insufficiency: Secondary | ICD-10-CM

## 2018-04-12 DIAGNOSIS — I340 Nonrheumatic mitral (valve) insufficiency: Secondary | ICD-10-CM

## 2018-04-12 DIAGNOSIS — I5030 Unspecified diastolic (congestive) heart failure: Secondary | ICD-10-CM

## 2018-04-12 DIAGNOSIS — F419 Anxiety disorder, unspecified: Secondary | ICD-10-CM

## 2018-04-12 DIAGNOSIS — Z87891 Personal history of nicotine dependence: Secondary | ICD-10-CM

## 2018-04-12 LAB — BASIC METABOLIC PANEL
Anion gap: 8 (ref 5–15)
BUN: 21 mg/dL (ref 8–23)
CO2: 25 mmol/L (ref 22–32)
CREATININE: 1.26 mg/dL — AB (ref 0.44–1.00)
Calcium: 8.3 mg/dL — ABNORMAL LOW (ref 8.9–10.3)
Chloride: 101 mmol/L (ref 98–111)
GFR calc Af Amer: 50 mL/min — ABNORMAL LOW (ref >60)
GFR calc non Af Amer: 43 mL/min — ABNORMAL LOW (ref >60)
Glucose, Bld: 110 mg/dL — ABNORMAL HIGH (ref 70–99)
Potassium: 4.5 mmol/L (ref 3.5–5.1)
Sodium: 134 mmol/L — ABNORMAL LOW (ref 135–145)

## 2018-04-12 LAB — CBC WITH DIFFERENTIAL/PLATELET
Abs Immature Granulocytes: 0.1 10*3/uL — ABNORMAL HIGH (ref 0.00–0.07)
Basophils Absolute: 0 10*3/uL (ref 0.0–0.1)
Basophils Relative: 0 %
Eosinophils Absolute: 0 10*3/uL (ref 0.0–0.5)
Eosinophils Relative: 0 %
HCT: 32.9 % — ABNORMAL LOW (ref 36.0–46.0)
Hemoglobin: 9.3 g/dL — ABNORMAL LOW (ref 12.0–15.0)
Lymphocytes Relative: 5 %
Lymphs Abs: 0.7 10*3/uL (ref 0.7–4.0)
MCH: 32.1 pg (ref 26.0–34.0)
MCHC: 28.3 g/dL — ABNORMAL LOW (ref 30.0–36.0)
MCV: 113.4 fL — ABNORMAL HIGH (ref 80.0–100.0)
Monocytes Absolute: 0.4 10*3/uL (ref 0.1–1.0)
Monocytes Relative: 3 %
Myelocytes: 1 %
Neutro Abs: 12.2 10*3/uL — ABNORMAL HIGH (ref 1.7–7.7)
Neutrophils Relative %: 91 %
Platelets: 209 10*3/uL (ref 150–400)
RBC: 2.9 MIL/uL — ABNORMAL LOW (ref 3.87–5.11)
RDW: 14.3 % (ref 11.5–15.5)
WBC: 13.4 10*3/uL — ABNORMAL HIGH (ref 4.0–10.5)
nRBC: 0 % (ref 0.0–0.2)
nRBC: 0 /100 WBC

## 2018-04-12 LAB — CBC
HCT: 29.5 % — ABNORMAL LOW (ref 36.0–46.0)
Hemoglobin: 9 g/dL — ABNORMAL LOW (ref 12.0–15.0)
MCH: 31.8 pg (ref 26.0–34.0)
MCHC: 30.5 g/dL (ref 30.0–36.0)
MCV: 104.2 fL — ABNORMAL HIGH (ref 80.0–100.0)
Platelets: 158 10*3/uL (ref 150–400)
RBC: 2.83 MIL/uL — ABNORMAL LOW (ref 3.87–5.11)
RDW: 14 % (ref 11.5–15.5)
WBC: 12 10*3/uL — ABNORMAL HIGH (ref 4.0–10.5)
nRBC: 0 % (ref 0.0–0.2)

## 2018-04-12 LAB — BLOOD GAS, ARTERIAL
Acid-base deficit: 0.3 mmol/L (ref 0.0–2.0)
Bicarbonate: 25.9 mmol/L (ref 20.0–28.0)
Drawn by: 54717
FIO2: 100
O2 Saturation: 97 %
PH ART: 7.261 — AB (ref 7.350–7.450)
Patient temperature: 99.1
pCO2 arterial: 59.9 mmHg — ABNORMAL HIGH (ref 32.0–48.0)
pO2, Arterial: 104 mmHg (ref 83.0–108.0)

## 2018-04-12 LAB — ECHOCARDIOGRAM COMPLETE
Height: 65 in
Weight: 3120 oz

## 2018-04-12 MED ORDER — SODIUM CHLORIDE 0.9 % IV SOLN
500.0000 mg | Freq: Once | INTRAVENOUS | Status: AC
  Administered 2018-04-12: 500 mg via INTRAVENOUS
  Filled 2018-04-12: qty 4

## 2018-04-12 NOTE — Progress Notes (Addendum)
  Date: 04/12/2018  Patient name: Milove Pembleton  Medical record number: [id number]  Date of birth: [birth year]   I have seen and evaluated [name] and discussed their care with the Residency Team. Briefly, Ms. Wheeler is a 68 year old woman with PMH of COPD, diastolic heart failure, anxiety who is presenting for SOB.  She developed acute SOB and cough at home; associated symptoms include increased need for oxygen and fever.  She was further tachycardic.  She has been seen twice in the past 2 months for pneumonia and completed 2 course of antibiotics.  CXR at this time appears to have diffuse infiltrates.  Interestingly, she was screened for autoimmune disease a few years ago and had some significantly positive findings including ANA, Anti Ro and Anti La.   PMHx, Fam Hx, and/or Soc Hx : + tobacco use history, quit in 2006.  Given length of smoking, she would likely qualify for lung cancer screening.   Vitals:   04/12/18 1800 04/12/18 1855  BP:    Pulse: (!) 105 (!) 106  Resp: (!) 28 (!) 23  Temp:    SpO2: 95% 91%   General: Staring in to space, asking for water, answers some questions Eyes: Anicteric sclerae HENT: Wearing ventimask CV: Tachycardic, regular, no murmur Pulm: Surprisingly clear, no crackles or rales, no wheezing (apparently had diffuse crackles on admission) Abd: Soft, +BS, NT, ND Ext: Non pitting edema noted bilaterally  Skin is warm and dry, no apparent rash on exposed skin  CXR: Diffuse haziness, worsened since last CXR  EKG: Sinus tachycardia  Assessment and Plan: I have seen and evaluated the patient as outlined above. I agree with the formulated Assessment and Plan as detailed in the residents' note, with the following changes:   1. Acute on chronic respiratory failure with hypoxia - DDx includes worsening pneumonia (not sensitive to levaquin), atypical infection, ILD, heart failure with pulmonary edema - Agree with plan to get TTE and work up with  respiratory panel, spirometry.  - Would get CT scan of the chest, possible ILD given CXR finding and frequent pneumonia - Telemetry - Continue dulera and albuterol as needed - WBC is elevated, but she is on a steroid taper.  I would opt to stop antibiotics at this time until we have more information.  If fever, would pan culture and restart Abx  Other issues per Dr. Patty Sermons note.  I would not target a 100% oxygen saturation in this woman.  If she does have worsened COPD, would not want to decrease her respiratory drive.    Inez Catalina, MD 1/25/20208:00 PM

## 2018-04-12 NOTE — Significant Event (Signed)
Rapid Response Event Note  Overview: Respiratory - "Second Set of Eyes"  Initial Focused Assessment: Called by RN about patient having ongoing tachypnea and requiring oxygen at 15L NRB. Per RN, patient has been on the NRB since around 0600 this morning. RN is concerned about the patient's work of breathing and overall respiratory state. I asked the RN to page the primary service because I was with another patient in distress.  When I arrived, patient was moderate distress and had taken her NRB off and the oxygen saturations were in the mid 70s, RR was in the mid 20s, + use of accessory muscles. Patient is very drowsy, does follow simple commands. Lung sounds diminished throughout. Saturations improved after a few minutes to 89-90% on 15L NRB.   I paged the IMTS MD and then came back to the bedside.   Interventions: -- STAT ABG  -- BIPAP  Plan of Care: -- Monitor respiratory status -- Monitor neurologic status -- Wean oxygen as needed.  -- I called for an update at 2235 - per RN, patient was more awake, respiratory status has improved   Event Summary:    at    Call Time 2026 Arrival Time 2100 End Time 2135  [name] [name] R

## 2018-04-12 NOTE — Progress Notes (Signed)
Pt's sats are 97% at this time.  FIO2 decreased from 60% to 45% in order to maintain pt's sats 89-92% per physician's notes.

## 2018-04-12 NOTE — Progress Notes (Signed)
Spo2 87% on 45% FIO2.  Increased O2 to 50%, Spo2 is not 89-90%.

## 2018-04-12 NOTE — Progress Notes (Signed)
Pt is awake and agitated, pulling at BIPAP mask.  Mask readjusted for pt comfort.  Will cont to monitor.

## 2018-04-12 NOTE — Progress Notes (Signed)
Pt was was SOB and requested breathing treatment. RN paged respiratory for PRN breathing treatment. Pt had a coughing spell and O2 saturations dropped into the 60s and HR in the 120s. RN immediately placed pt on non-rebreather and notified respiratory of change in pt status. RN notified IMTS night coverage about pt status and instructed to page back after respiratory therapist has seen pt.  RN paged IMTS after pt received PRN breathing treatment and told doctor that the pt lungs sounded wet and crackly. RN reported pt O2 saturations in the low 90s on the non-rebreather. No new additional orders given at this time. Will continue to monitor pt.

## 2018-04-12 NOTE — Progress Notes (Signed)
Pt placed on BIPAP for increased wob and increasing lethargy.  ABG collected and sent to lab.  Rapid Response Nurse and Physician's on unit to evaluate pt.  Tolerating BIPAP well at the moment.  Will cont to monitor and assess.

## 2018-04-12 NOTE — Progress Notes (Signed)
  Echocardiogram 2D Echocardiogram has been performed.  Roosvelt Maser F 04/12/2018, 2:56 PM

## 2018-04-12 NOTE — Progress Notes (Signed)
   Subjective: This morning the patient had a coughing spell and O2 saturations dropped into the 60's and HR in the 120's. She was placed on a non-rebreather and received a breathing treatment. Per respiratory team her lungs sounded wet and crackly. She was seen this morning on the NRB saturating above 100%. Her O2 was decreased to about 8L. She states she feels okay but her mouth is dry.  Objective:  Vital signs in last 24 hours: Vitals:   04/19/18 2234 04/12/18 0300 04/12/18 0600 04/12/18 0605  BP: (!) 92/59 (!) 107/59  106/81  Pulse: 72 66 (!) 149 (!) 41  Resp: (!) 22 20 (!) 26 (!) 29  Temp: 98 F (36.7 C) 97.9 F (36.6 C)  (!) 97.4 F (36.3 C)  TempSrc: Oral Oral  Oral  SpO2:  98% 91% (!) 88%  Weight:      Height:       Gen: seen sitting in bed, working harder to breath, on NRB Lungs: ctab, no wheezing or crackles CV: RRR, no murmurs Ext: extensive bruising on LE, mild non-pitting edema   Assessment/Plan:  Active Problems:   Acute on chronic respiratory failure with hypoxia (HCC)   Pressure injury of skin  Ms. [name] [name] is a 68 yo female with COPD, diastolic heart failure, GERD, depression and anxiety presenting for worsening shortness of breath. She was found to be hypoxic but saturating well on 4L supplemental oxygen.   Acute on chronic respiratory failure with hypoxia - Patient's respiratory status worsened overnight, requiring non-re breather - Saturating above 100% on 10L, decreased to 6L and she continued saturating well - Will transition her from NRB back to [place] - Lung exam was ctab, unclear whether she has a true dx of COPD vs ILD vs worsening HF   - Respiratory panel negative - Disontinue cefepime and vancomycin, will follow fever curve - Follow up high resolution CT scan  - Follow up Echocardiogram - Follow up Spirometry  - Continuous pulse ox - Goal SpO2 88-92% - Continue Dulera bid, albuterol nebulizer as needed  - Mucinex 600 mg bid  - Cardiac  monitoring   Diastolic HF - BNP 512 - Follow up echo    Dispo: Anticipated discharge pending clinical improvement.   Jaci Standard, DO 04/12/2018, 6:41 AM Pager: [phone]

## 2018-04-12 NOTE — Progress Notes (Addendum)
Night team progress note:  We visited and evaluated patient at bedside after got paged by RN about patient is tachypneic and sluggish. [name] [name] mentions that she feels better.   Objective: Vital signs: She was saturating between 92 to 94% with 14% O2 through Non-rebreather Mask. RR:19, is afebrile.  General: She looks tired with some increased work of breathing, and some accessory muscle use. She does remain oriented x 3. And mentioned that she feels better. Lung exam: Has diffuse fine crackle on exam. Moderate respiratory distress.  *She was seen again about 20 minutes following initial exam in conjunction with rapid response team. She had pulled off her non-rebreather and desaturated to the 70s  Assessment and plan:  68 y/o F with PMHx. of COPD, diastolic HF, anxiey, and Hx of smoking, who presented with acute on chronic respiratory failure with hypoxia likely 2/2 Interstitial lung disease.  Moderate respiratory distress. Is oriented x 3. Considering Hx of COPD, the goal is keeping her O2 sat between 89-92% to avoid decreasing respiratory drive. Working diagnosis is ILD and she does seem to be worsening based on prior notes and discussion with care team. Will treat with half dose steroid pulse and check ABG and BiPAP as patient is likely tiring given accessory muscle use, likely acidodic given she is more sluggish per staff, and requiring 14-15L to maintain saturations. - Maintain O2 sats 88-92% - ABG - BiPAP - Methylprednisolone 500mg  IV  Addendum: ABG confirmed Respiratory Acidosis, will continue on BiPAP ABG    Component Value Date/Time   PHART 7.261 (L) 04/12/2018 2114   PCO2ART 59.9 (H) 04/12/2018 2114   PO2ART 104 04/12/2018 2114   HCO3 25.9 04/12/2018 2114   TCO2 29 04/13/2018 1131   ACIDBASEDEF 0.3 04/12/2018 2114   O2SAT 97.0 04/12/2018 2114

## 2018-04-13 ENCOUNTER — Encounter (HOSPITAL_COMMUNITY): Payer: Self-pay | Admitting: Acute Care

## 2018-04-13 ENCOUNTER — Inpatient Hospital Stay (HOSPITAL_COMMUNITY): Payer: Medicare HMO

## 2018-04-13 DIAGNOSIS — J9621 Acute and chronic respiratory failure with hypoxia: Secondary | ICD-10-CM

## 2018-04-13 DIAGNOSIS — E872 Acidosis: Secondary | ICD-10-CM

## 2018-04-13 DIAGNOSIS — F329 Major depressive disorder, single episode, unspecified: Secondary | ICD-10-CM

## 2018-04-13 DIAGNOSIS — J189 Pneumonia, unspecified organism: Secondary | ICD-10-CM

## 2018-04-13 LAB — POCT I-STAT 7, (LYTES, BLD GAS, ICA,H+H)
Acid-base deficit: 3 mmol/L — ABNORMAL HIGH (ref 0.0–2.0)
Bicarbonate: 24.4 mmol/L (ref 20.0–28.0)
Calcium, Ion: 1.27 mmol/L (ref 1.15–1.40)
HCT: 25 % — ABNORMAL LOW (ref 36.0–46.0)
Hemoglobin: 8.5 g/dL — ABNORMAL LOW (ref 12.0–15.0)
O2 Saturation: 93 %
Potassium: 4.7 mmol/L (ref 3.5–5.1)
Sodium: 140 mmol/L (ref 135–145)
TCO2: 26 mmol/L (ref 22–32)
pCO2 arterial: 52.9 mmHg — ABNORMAL HIGH (ref 32.0–48.0)
pH, Arterial: 7.272 — ABNORMAL LOW (ref 7.350–7.450)
pO2, Arterial: 77 mmHg — ABNORMAL LOW (ref 83.0–108.0)

## 2018-04-13 LAB — BLOOD GAS, ARTERIAL
ACID-BASE DEFICIT: 0.6 mmol/L (ref 0.0–2.0)
Bicarbonate: 25.1 mmol/L (ref 20.0–28.0)
Drawn by: 546661
O2 Content: 8 L/min
O2 Saturation: 87.8 %
PH ART: 7.292 — AB (ref 7.350–7.450)
Patient temperature: 98.6
pCO2 arterial: 53.6 mmHg — ABNORMAL HIGH (ref 32.0–48.0)
pO2, Arterial: 58.8 mmHg — ABNORMAL LOW (ref 83.0–108.0)

## 2018-04-13 LAB — BASIC METABOLIC PANEL
Anion gap: 6 (ref 5–15)
BUN: 25 mg/dL — ABNORMAL HIGH (ref 8–23)
CO2: 25 mmol/L (ref 22–32)
Calcium: 8.4 mg/dL — ABNORMAL LOW (ref 8.9–10.3)
Chloride: 106 mmol/L (ref 98–111)
Creatinine, Ser: 1.19 mg/dL — ABNORMAL HIGH (ref 0.44–1.00)
GFR calc Af Amer: 54 mL/min — ABNORMAL LOW (ref >60)
GFR calc non Af Amer: 47 mL/min — ABNORMAL LOW (ref >60)
Glucose, Bld: 97 mg/dL (ref 70–99)
Potassium: 4.8 mmol/L (ref 3.5–5.1)
Sodium: 137 mmol/L (ref 135–145)

## 2018-04-13 LAB — GLUCOSE, CAPILLARY
Glucose-Capillary: 120 mg/dL — ABNORMAL HIGH (ref 70–99)
Glucose-Capillary: 139 mg/dL — ABNORMAL HIGH (ref 70–99)

## 2018-04-13 LAB — PROCALCITONIN: PROCALCITONIN: 0.92 ng/mL

## 2018-04-13 LAB — MAGNESIUM: Magnesium: 2.2 mg/dL (ref 1.7–2.4)

## 2018-04-13 MED ORDER — BISACODYL 10 MG RE SUPP: 10.0000 mg | Freq: Every day | RECTAL | Status: DC | PRN

## 2018-04-13 MED ORDER — ORAL CARE MOUTH RINSE: 15.0000 mL | OROMUCOSAL | Status: DC

## 2018-04-13 MED ORDER — FENTANYL BOLUS VIA INFUSION
25.0000 ug | INTRAVENOUS | Status: DC | PRN
  Administered 2018-04-15 – 2018-04-17 (×4): 25 ug via INTRAVENOUS
  Filled 2018-04-13: qty 25

## 2018-04-13 MED ORDER — CHLORHEXIDINE GLUCONATE 0.12% ORAL RINSE (MEDLINE KIT): 15.0000 mL | Freq: Two times a day (BID) | OROMUCOSAL | Status: DC

## 2018-04-13 MED ORDER — FENTANYL CITRATE (PF) 100 MCG/2ML IJ SOLN: 50.0000 ug | Freq: Once | INTRAMUSCULAR | Status: DC

## 2018-04-13 MED ORDER — VANCOMYCIN HCL IN DEXTROSE 750-5 MG/150ML-% IV SOLN
750.0000 mg | INTRAVENOUS | Status: DC
  Administered 2018-04-14: 750 mg via INTRAVENOUS
  Filled 2018-04-13 (×2): qty 150

## 2018-04-13 MED ORDER — SODIUM CHLORIDE 0.9 % IV SOLN
500.0000 mg | INTRAVENOUS | Status: DC
  Filled 2018-04-13: qty 4

## 2018-04-13 MED ORDER — ETOMIDATE 2 MG/ML IV SOLN
20.0000 mg | Freq: Once | INTRAVENOUS | Status: AC
  Administered 2018-04-13: 20 mg via INTRAVENOUS

## 2018-04-13 MED ORDER — SODIUM CHLORIDE 0.9 % IV SOLN
2.0000 g | INTRAVENOUS | Status: AC
  Administered 2018-04-13 – 2018-04-17 (×5): 2 g via INTRAVENOUS
  Filled 2018-04-13 (×5): qty 2

## 2018-04-13 MED ORDER — METHYLPREDNISOLONE SODIUM SUCC 125 MG IJ SOLR
125.0000 mg | Freq: Three times a day (TID) | INTRAMUSCULAR | Status: AC
  Administered 2018-04-13 – 2018-04-15 (×6): 125 mg via INTRAVENOUS
  Filled 2018-04-13 (×6): qty 2

## 2018-04-13 MED ORDER — PHENYLEPHRINE HCL-NACL 10-0.9 MG/250ML-% IV SOLN
0.0000 ug/min | INTRAVENOUS | Status: DC
  Administered 2018-04-13: 40 ug/min via INTRAVENOUS
  Administered 2018-04-13: 10 ug/min via INTRAVENOUS
  Administered 2018-04-14: 90 ug/min via INTRAVENOUS
  Administered 2018-04-14: 100 ug/min via INTRAVENOUS
  Administered 2018-04-14: 80 ug/min via INTRAVENOUS
  Administered 2018-04-14: 90 ug/min via INTRAVENOUS
  Administered 2018-04-14: 95 ug/min via INTRAVENOUS
  Administered 2018-04-14: 60 ug/min via INTRAVENOUS
  Administered 2018-04-14: 80 ug/min via INTRAVENOUS
  Filled 2018-04-13 (×10): qty 250

## 2018-04-13 MED ORDER — CHLORHEXIDINE GLUCONATE 0.12% ORAL RINSE (MEDLINE KIT)
15.0000 mL | Freq: Two times a day (BID) | OROMUCOSAL | Status: DC
  Administered 2018-04-13 – 2018-04-17 (×8): 15 mL via OROMUCOSAL

## 2018-04-13 MED ORDER — MIDAZOLAM HCL 2 MG/2ML IJ SOLN
2.0000 mg | Freq: Once | INTRAMUSCULAR | Status: AC
  Administered 2018-04-13: 2 mg via INTRAVENOUS
  Filled 2018-04-13: qty 2

## 2018-04-13 MED ORDER — INSULIN ASPART 100 UNIT/ML ~~LOC~~ SOLN
2.0000 [IU] | SUBCUTANEOUS | Status: DC
  Administered 2018-04-13 – 2018-04-16 (×9): 2 [IU] via SUBCUTANEOUS
  Administered 2018-04-16 (×4): 4 [IU] via SUBCUTANEOUS
  Administered 2018-04-16 – 2018-04-17 (×2): 2 [IU] via SUBCUTANEOUS
  Administered 2018-04-17: 4 [IU] via SUBCUTANEOUS
  Administered 2018-04-17 (×2): 2 [IU] via SUBCUTANEOUS

## 2018-04-13 MED ORDER — SODIUM CHLORIDE 0.9 % IV BOLUS
500.0000 mL | Freq: Once | INTRAVENOUS | Status: AC
  Administered 2018-04-13: 500 mL via INTRAVENOUS

## 2018-04-13 MED ORDER — ORAL CARE MOUTH RINSE
15.0000 mL | OROMUCOSAL | Status: DC
  Administered 2018-04-13 – 2018-04-17 (×38): 15 mL via OROMUCOSAL

## 2018-04-13 MED ORDER — GUAIFENESIN 100 MG/5ML PO SOLN
15.0000 mL | Freq: Four times a day (QID) | ORAL | Status: DC
  Administered 2018-04-13 – 2018-04-17 (×15): 300 mg
  Filled 2018-04-13 (×18): qty 15

## 2018-04-13 MED ORDER — MIDAZOLAM HCL 2 MG/2ML IJ SOLN
1.0000 mg | INTRAMUSCULAR | Status: DC | PRN
  Administered 2018-04-17: 1 mg via INTRAVENOUS
  Filled 2018-04-13: qty 2

## 2018-04-13 MED ORDER — FENTANYL 2500MCG IN NS 250ML (10MCG/ML) PREMIX INFUSION
25.0000 ug/h | INTRAVENOUS | Status: DC
  Administered 2018-04-13: 75 ug/h via INTRAVENOUS
  Administered 2018-04-14: 200 ug/h via INTRAVENOUS
  Administered 2018-04-15 (×2): 100 ug/h via INTRAVENOUS
  Administered 2018-04-17: 225 ug/h via INTRAVENOUS
  Filled 2018-04-13 (×5): qty 250

## 2018-04-13 MED ORDER — DOCUSATE SODIUM 50 MG/5ML PO LIQD: 100.0000 mg | Freq: Two times a day (BID) | ORAL | Status: DC | PRN

## 2018-04-13 MED ORDER — FENTANYL CITRATE (PF) 100 MCG/2ML IJ SOLN
100.0000 ug | Freq: Once | INTRAMUSCULAR | Status: AC
  Administered 2018-04-13: 50 ug via INTRAVENOUS
  Filled 2018-04-13: qty 2

## 2018-04-13 MED ORDER — SODIUM CHLORIDE 0.9 % IV SOLN
INTRAVENOUS | Status: AC
  Administered 2018-04-13: 14:00:00 via INTRAVENOUS

## 2018-04-13 MED ORDER — VANCOMYCIN HCL 10 G IV SOLR
1500.0000 mg | Freq: Once | INTRAVENOUS | Status: AC
  Administered 2018-04-13: 1500 mg via INTRAVENOUS
  Filled 2018-04-13: qty 1500

## 2018-04-13 MED ORDER — IPRATROPIUM-ALBUTEROL 0.5-2.5 (3) MG/3ML IN SOLN
3.0000 mL | Freq: Four times a day (QID) | RESPIRATORY_TRACT | Status: DC
  Administered 2018-04-13 – 2018-04-17 (×17): 3 mL via RESPIRATORY_TRACT
  Filled 2018-04-13 (×17): qty 3

## 2018-04-13 MED ORDER — MIDAZOLAM HCL 2 MG/2ML IJ SOLN
1.0000 mg | INTRAMUSCULAR | Status: DC | PRN
  Filled 2018-04-13: qty 2

## 2018-04-13 MED ORDER — BUDESONIDE 0.25 MG/2ML IN SUSP
0.2500 mg | Freq: Two times a day (BID) | RESPIRATORY_TRACT | Status: DC
  Administered 2018-04-13 – 2018-04-17 (×8): 0.25 mg via RESPIRATORY_TRACT
  Filled 2018-04-13 (×9): qty 2

## 2018-04-13 NOTE — Progress Notes (Signed)
  Date: 04/13/2018  Patient name: [name] [name]  Medical record number: [id number]  Date of birth: 06-21-1950   I have seen and evaluated this patient and I have discussed the plan of care with the house staff. Please see Dr. Reginal Lutes note for complete details. I concur with his findings with the following additions/corrections:   Discussed care with Kandice Robinsons NP for PCCM.  PCCM team will take patient to ICU for possible intubation and further management.   Inez Catalina, MD 04/13/2018, 11:13 PM

## 2018-04-13 NOTE — Progress Notes (Signed)
Placed pt on HFNC with salter at 6lpm.  Pt maintaining spO2 at 96%.  Will continue to monitor.  RN aware.

## 2018-04-13 NOTE — Progress Notes (Signed)
RN went into pt's room and pt was very lethargic, but answered orientation questions appropriately. Pt was also tachypneic with respirations in the high 20s to low 30s at times and O2 saturation was 94% on the non-rebreather at 15L. RN immediatelty notified the INMTS coverage and Rapid Response RN about pt status and asked for both to come to bedside to assess pt. Will continue to monitor and assess pt and wait for further orders.

## 2018-04-13 NOTE — Consult Note (Signed)
NAME:  [name] FallJudy Levee, MRN:  [id number], DOB:  1951-03-18, LOS: 2 ADMISSION DATE:  03/19/2018, CONSULTATION DATE:  04/13/2018 REFERRING MD:  Levora DredgeJustin Helberg, CHIEF COMPLAINT:  Worsening Acute on Chronic Respiratory Failure   Brief History   68 year old female former  smoker ( 40 pack year smoking history quit 2002)  with COPD, Pulmonary HTN, and history of pneumonia x 4 in the last 12 months ( twice in the last month) [facility], [facility] worsening respiratory distress x 3 days. She had been diagnosed with pneumonia 1/22.  Saturations were 68% on 2 L per EMS notes . She was admitted and treated with  Antibiotics x 1 day. She has become progressively hypoxic despite increased oxygen support and BiPAP since 1/25 .PCCM have been asked to evaluate patient for abnormal CT Chest and worsening hypoxemia.  History of present illness   68 year old female former  smoker ( 40 pack year smoking history quit 2002)  with COPD, Pulmonary HTN, and history of pneumonia x 4 in the last 12 months ( twice in the last month) [facility] with worsening respiratory distress x 3 days. She had been diagnosed with pneumonia 1/22.  She has been treated for CAP with steroids and antibiotics over the last month.Saturations were 68% on 2 L per EMS notes . She was admitted and treated with  Antibiotics x 1 day. She has become progressively hypoxic despite increased oxygen support and BiPAP since 1/25 . ABG today 1/26 >> 7.29/53.6/58.8 / 25.1. BP has remained soft, and CT chest 04/12/2018  concerning for diffuse progressive bilateral ground glass opacities compatible with ARDS  vs acute interstitial pneumonitis.Additionally, small bilateral pleural effusions , and mediastinal adenopathy reactive vs. Inflammation/ infection, metastatic adenopathy, or lymphoproliferative disorder. PCT on 1/24 was 0.35. PCCM  evaluated patient and with rapidly progressive hypoxemia and soft BP decision was made to  transfer  patient to ICU where the critical care service will assume care. Patient stated she would want short term intubation if necessary.   Past Medical History   Past Medical History:  Diagnosis Date  . Acute renal failure (HCC)   . Anemia   . Angina   . Anxiety   . Arthritis    "right knee" (02/28/2018)  . CAP (community acquired pneumonia) 02/28/2018  . CHF (congestive heart failure) (HCC)   . COPD (chronic obstructive pulmonary disease) (HCC)   . Degenerative disorder of eye    Right eye  . Depression   . Dizziness   . Fluid retention   . GERD (gastroesophageal reflux disease)   . History of hiatal hernia   . Hypertension   . Pneumonia    "get pneumonia 2-3 times q yr" (02/28/2018)  . Shortness of breath     Significant Hospital Events   04/10/2018>> Admission  Consults:  1/26>> PCCM  Procedures:    Significant Diagnostic Tests:  CT Chest 1/25>> diffuse progressive bilateral ground glass opacities compatible with ARDS  vs acute interstitial pneumonitis.Additionally, small bilateral pleural effusions , and mediastinal adenopathy reactive vs. Inflammation/ infection, metastatic adenopathy, or lymphoproliferative disorder. No significant bronchiectasis or peripheral interstitial reticulation. Question mild focal area of subpleural honeycombing within the posterolateral left upper lobe and anterior right upper lobe  Echo 04/12/2018 EF 60-65%, mild aortic stenosis and regurgitation,MV: mild to moderate regurgitation,LA with mild dialtion, RV with mild dilation,RA with mild dilation,Severe tricuspid regurgitation, PAPP 71 mm Hg  Micro Data:  1/24 RVP>> Negative 1/24 Blood  Cultures>> NGTD 1/26>> Urine legionella 1/26>> Urine strep  Antimicrobials:  Vanc 1/26>> Cefepime 1/26>>  Interim history/subjective:  Progressive hypoxemia despite increased oxygen support and BiPAP Soft BP States her nose hurts. Would want short term intubation if necessary. She was a full code at  [facility]  Objective   Blood pressure (!) 97/54, pulse 89, temperature 97.9 F (36.6 C), temperature source Oral, resp. rate (!) 37, height 5\' 5"  (1.651 m), weight 88.5 kg, SpO2 (!) 89 %.    FiO2 (%):  [60 %] 60 %   Intake/Output Summary (Last 24 hours) at 04/13/2018 1243 Last data filed at 04/13/2018 0600 Gross per 24 hour  Intake 50 ml  Output 550 ml  Net -500 ml   Filed Weights   04/16/2018 1053  Weight: 88.5 kg    Examination: General: Acutely ill elderly female on BiPAP, drowsy HENT: NCAT, BiPAP mask, No JVD, No LAD Lungs: Bilateral chest excursion, coarse throughout with rales per bases, BiPAP at present.RR is 37 Cardiovascular: Distant, S1, S2, RRR, No RMG Abdomen: Soft, NT, ND, BS diminished Extremities: Decreased bulk, no obvious deformities noted Neuro: Alert to self and place, Groggy, Follows commands and attempts to answer questions.   Resolved Hospital Problem list     Assessment & Plan:  Acute on Chronic Respiratory Failure  progressive diffuse infiltrates per CT chest Frequent pneumonia COPD Pulmonary HTN Former smoker Positive findings including ANA, Anti Ro and Anti La. In past  Plan Transfer to ICU now NPO Continue BiPAP on current settings ABG at 1600 ABG prn Wean oxygen for saturation goals are 88-92% Scheduled Pulmicort and Duonebs CXR 1/27 am  Solumedrol 125 Q 8 x 6 doses then taper Vanc and Cefepime as ordered PCT  Urine strep Urine legionella Check Mag Minimize sedation If intubated, Sputum Culture  Diastolic HF EF 60-65% BNP>> 512 Soft BP Plan Trend BNP as needed Monitor strict I&O MAP goals > 65 mm Kg Consider diuresis once BP allows  Renal No Acute Issues Plan BMET  Monitor and replete electrolytes prn Maintain renal perfusion  ID Low grade temp 99.8 on admission Leukocytosis Plan ABX as above Trend PCT to guide therapy Trend fever and WBC Culture as is clinically indicated  GI Plan NPO for  now SUP  Endocrine High Dose Steroids Plan CBG Q 4 SSI    Best practice:  Diet: NPO Pain/Anxiety/Delirium protocol (if indicated): NA VAP protocol (if indicated): NA DVT prophylaxis: Lovenox GI prophylaxis: Protonix Glucose control: CBG/ SSI Mobility: BR Code Status: Full Code Family Communication: Patient updated Disposition: ICU  Of note, per [employer], patient's son is HCPOA. Patient and son  are currently having issues. Patient does not agree with son's life choices and she does not want him making her health care decisions. She is in the process of looking for an alternate HCOA per [facility], however son is still listed as HCPOA    Labs   CBC: Recent Labs  Lab 04/07/2018 1101 04/01/2018 1131 04/12/18 0258  WBC 13.4*  --  12.0*  NEUTROABS 12.2*  --   --   HGB 9.3* 9.5* 9.0*  HCT 32.9* 28.0* 29.5*  MCV 113.4*  --  104.2*  PLT 209  --  158    Basic Metabolic Panel: Recent Labs  Lab 03/28/2018 1101 04/09/2018 1131 04/12/18 0258 04/13/18 0216  NA 133* 135 134* 137  K 4.2 4.1 4.5 4.8  CL 100  --  101 106  CO2 26  --  25  25  GLUCOSE 101*  --  110* 97  BUN 17  --  21 25*  CREATININE 1.14*  --  1.26* 1.19*  CALCIUM 8.5*  --  8.3* 8.4*   GFR: Estimated Creatinine Clearance: 49 mL/min (A) (by C-G formula based on SCr of 1.19 mg/dL (H)). Recent Labs  Lab 2018-05-02 1101 2018/05/02 1301 02-May-2018 1836 04/12/18 0258  PROCALCITON  --   --  0.35  --   WBC 13.4*  --   --  12.0*  LATICACIDVEN 1.3 1.4  --   --     Liver Function Tests: Recent Labs  Lab 05-02-18 1101  AST 33  ALT 15  ALKPHOS 57  BILITOT 0.7  PROT 6.5  ALBUMIN 2.1*   No results for input(s): LIPASE, AMYLASE in the last 168 hours. No results for input(s): AMMONIA in the last 168 hours.  ABG    Component Value Date/Time   PHART 7.292 (L) 04/13/2018 1110   PCO2ART 53.6 (H) 04/13/2018 1110   PO2ART 58.8 (L) 04/13/2018 1110   HCO3 25.1 04/13/2018 1110   TCO2 29 05/02/2018 1131    ACIDBASEDEF 0.6 04/13/2018 1110   O2SAT 87.8 04/13/2018 1110     Coagulation Profile: No results for input(s): INR, PROTIME in the last 168 hours.  Cardiac Enzymes: No results for input(s): CKTOTAL, CKMB, CKMBINDEX, TROPONINI in the last 168 hours.  HbA1C: No results found for: HGBA1C  CBG: No results for input(s): GLUCAP in the last 168 hours.  Review of Systems:   Unable as patient is groggy and unable to answer questions  Past Medical History  She,  has a past medical history of Acute renal failure (HCC), Anemia, Angina, Anxiety, Arthritis, CAP (community acquired pneumonia) (02/28/2018), CHF (congestive heart failure) (HCC), COPD (chronic obstructive pulmonary disease) (HCC), Degenerative disorder of eye, Depression, Dizziness, Fluid retention, GERD (gastroesophageal reflux disease), History of hiatal hernia, Hypertension, Pneumonia, and Shortness of breath.   Surgical History    Past Surgical History:  Procedure Laterality Date  . FRACTURE SURGERY    . LAPAROSCOPIC CHOLECYSTECTOMY    . PATELLA FRACTURE SURGERY Right   . TUBAL LIGATION       Social History   reports that she quit smoking about 18 years ago. She has a 3.00 pack-year smoking history. Her smokeless tobacco use includes snuff. She reports that she does not drink alcohol or use drugs.   Family History   Her family history includes Heart failure in her mother; Stroke in her father.   Allergies Allergies  Allergen Reactions  . Codeine Other (See Comments)    Feel Numb.      Home Medications  Prior to Admission medications   Medication Sig Start Date End Date Taking? Authorizing Provider  aspirin 81 MG tablet Take 81 mg by mouth daily.   Yes [provider]  clonazePAM (KLONOPIN) 1 MG tablet Take 1 tablet (1 mg total) by mouth 2 (two) times daily. 03/06/18  Yes Elgergawy, Leana Roe, MD  Coenzyme Q10 (COQ10) 200 MG CAPS Take 200 mg by mouth daily.    Yes [provider]  feeding  supplement, ENSURE ENLIVE, (ENSURE ENLIVE) LIQD Take 237 mLs by mouth 2 (two) times daily between meals. 03/06/18  Yes Elgergawy, Leana Roe, MD  FLUoxetine (PROZAC) 10 MG capsule Take 10 mg by mouth daily. Take with 20 mg for a total of 30 mgn 04/09/18  Yes [provider]  FLUoxetine (PROZAC) 20 MG capsule Take 20 mg by mouth daily. Give  with 10 mg to have a total of 30 mg   Yes [provider]  Fluticasone-Salmeterol (ADVAIR) 250-50 MCG/DOSE AEPB Inhale 1 puff into the lungs 2 (two) times daily. 11/07/11  Yes Larey SeatKesty, Jenna M, MD  guaiFENesin (MUCINEX) 600 MG 12 hr tablet Take 600 mg by mouth 2 (two) times daily.   Yes [provider]  Iron-FA-B Cmp-C-Biot-Probiotic (FUSION PLUS PO) Take 1 capsule by mouth daily.   Yes [provider]  levofloxacin (LEVAQUIN) 750 MG tablet Take 750 mg by mouth every evening.  04/07/18  Yes [provider]  lidocaine (LIDODERM) 5 % Place 1 patch onto the skin daily. Remove & Discard patch within 12 hours or as directed by MD 11/17/17  Yes Joy, Shawn C, PA-C  pantoprazole (PROTONIX) 40 MG tablet Take 1 tablet (40 mg total) by mouth daily. 03/06/18  Yes Elgergawy, Leana Roeawood S, MD  polyethylene glycol (MIRALAX / GLYCOLAX) packet Take 17 g by mouth every evening.   Yes [provider]  potassium chloride SA (K-DUR,KLOR-CON) 20 MEQ tablet Take 20 mEq by mouth every other day.    Yes [provider]  predniSONE (DELTASONE) 10 MG tablet 40 mg oral daily x2 days, then 30 mg oral daily x2 days, then 20 mg oral daily x2 days, then 10 mg oral daily x2 days, then stop Patient taking differently: Take 20 mg by mouth daily.  03/06/18  Yes Elgergawy, Leana Roeawood S, MD  sennosides-docusate sodium (SENOKOT-S) 8.6-50 MG tablet Take 1 tablet by mouth 2 (two) times daily.   Yes [provider]  albuterol (PROVENTIL) (2.5 MG/3ML) 0.083% nebulizer solution Take 3 mLs (2.5 mg total) by nebulization every 4 (four) hours as needed for  wheezing or shortness of breath. Patient not taking: Reported on 04/09/2018 03/06/18   Elgergawy, Leana Roeawood S, MD  calcium-vitamin D (OSCAL) 250-125 MG-UNIT per tablet Take 1 tablet by mouth daily. 11/07/11 02/28/18  Larey SeatKesty, Jenna M, MD  cefdinir (OMNICEF) 300 MG capsule Take 1 capsule (300 mg total) by mouth 2 (two) times daily. Patient not taking: Reported on 04/15/2018 04/03/18   Cathren LaineSteinl, Kevin, MD  ipratropium-albuterol (DUONEB) 0.5-2.5 (3) MG/3ML SOLN Take 3 mLs by nebulization every 6 (six) hours. Patient not taking: Reported on 03/27/2018 03/06/18   Elgergawy, Leana Roeawood S, MD     APP Critical care time: 5360 minutes   Bevelyn NgoSarah F. Groce, AGACNP-BC [facility] Pulmonary/Critical Care Medicine Pager # [phone] After 4 pm call [phone] 04/13/2018 2:59 PM

## 2018-04-13 NOTE — Progress Notes (Signed)
Pt remains on BIPAP.  Still agitated/restless, pulling at mask.  Mask readjusted to compensate for leak.  Will cont to monoitor.

## 2018-04-13 NOTE — Procedures (Signed)
Intubation Procedure Note [name] [name] [id number] 05/13/1950  Procedure: Intubation Indications: Respiratory insufficiency  Procedure Details Consent: Risks of procedure as well as the alternatives and risks of each were explained to the (patient/caregiver).  Consent for procedure obtained. Time Out: Verified patient identification, verified procedure, site/side was marked, verified correct patient position, special equipment/implants available, medications/allergies/relevent history reviewed, required imaging and test results available.  Performed  Maximum sterile technique was used including gloves, gown, hand hygiene and mask.  MAC and 3    Evaluation Hemodynamic Status: BP stable throughout; O2 sats: stable throughout Patient's Current Condition: stable Complications: No apparent complications Patient did tolerate procedure well. Chest X-ray ordered to verify placement.  CXR: pending.   Procedure completed under the direct supervision of Dr. Baltazar Apo, with Glide Scope and visualization of cords. + Color change and bilateral  breath sounds auscultated post intubation. CXR pending  [name] [name] 04/13/2018 4:37 PM

## 2018-04-13 NOTE — Progress Notes (Signed)
RN gave bedside report to [facility] on 68M

## 2018-04-13 NOTE — Progress Notes (Signed)
RN walked into pt's room and pt was pulling at Bipap mask. RN provided education on why pt needed to remain on Bipap. Pt started to point to chest and RN asked, "Are you have chest pain?" Pt nodded her head. RN obtained EKG which showed NSR and placed in pt's chart. After obtaining EKG, RN asked pt if she was still having chest pain and and pt shook her head no. RN notified IMTS of pt's chest pain and results of EKG. RN also notified that pt was restless on the Bipap and trying to take mask off. Will continue to monitor and assess pt.

## 2018-04-13 NOTE — Progress Notes (Signed)
Pharmacy Antibiotic Note  [name] [name] is a 68 y.o. female admitted on 04/04/2018 with pneumonia. Pharmacy has been consulted for vancomycin and cefepime dosing. Pt is afebrile but WBC is elevated at 12. SCr is 1.19 and lactic acid is WNL.   Plan: Vancomycin 1500mg  IV x 1 then 750mg  IV Q24H Cefepime 2gm IV q24h F/u renal fxn, C&S, clinical status and peak/trough at SS  Height: 5\' 5"  (165.1 cm) Weight: 195 lb (88.5 kg) IBW/kg (Calculated) : 57  Temp (24hrs), Avg:98.4 F (36.9 C), Min:96 F (35.6 C), Max:99.8 F (37.7 C)  Recent Labs  Lab 04/03/2018 1101 04/08/2018 1301 04/12/18 0258 04/13/18 0216  WBC 13.4*  --  12.0*  --   CREATININE 1.14*  --  1.26* 1.19*  LATICACIDVEN 1.3 1.4  --   --     Estimated Creatinine Clearance: 49 mL/min (A) (by C-G formula based on SCr of 1.19 mg/dL (H)).    Allergies  Allergen Reactions  . Codeine Other (See Comments)    Feel Numb.     Antimicrobials this admission: Vanc 1/24>>1/25, 1/26>> Cefepime 1/24>>1/25, 1/26>>  Microbiology results: Pending  Berl Bonfanti A. Jeanella Craze, PharmD, BCPS Clinical Pharmacist [place] Please utilize Amion for appropriate phone number to reach the unit pharmacist [facility])   04/13/2018 1:15 PM

## 2018-04-13 NOTE — Progress Notes (Signed)
Brief Progress Note Patient continued to decline and I spoke with patient about whether she would want to be intubated if she continued to decline. She was a full code at [facility]. She said that she desired a short term intubation to see if she could overcome the acute respiratory failure. She does not desire a long term intubation , or tracheostomy if she is not able to wean from mechanical ventilation after short term trial and treatment for her infiltrates.   Bevelyn Ngo, AGACNP-BC [facility] Pulmonary/Critical Care Medicine Pager # [phone] Pager [phone] after 4 04/13/2018 4:45 PM

## 2018-04-13 NOTE — Progress Notes (Addendum)
   Subjective: Patient resting comfortably on BiPAP this AM. Upon arousal she tells me her breathing is improved and she is comfortable on the BiPAP. Denies any pain. Discussed the plan to continue steroids, alternate on/off the BiPAP, and may need to give her some diuretics as BP allows. All questions and concerns addressed.   Objective: Vital signs in last 24 hours: Vitals:   04/13/18 0409 04/13/18 0541 04/13/18 0545 04/13/18 0708  BP: (!) 99/57 (!) 95/51 90/62 (!) 86/51  Pulse: 86 81  82  Resp: (!) 24 19  (!) 22  Temp: 98.7 F (37.1 C)     TempSrc: Axillary     SpO2: 95% 98%  96%  Weight:      Height:       Gen: Elderly female resting comfortably on BiPAP Pulm: Good air movement, unable to appreciate crackles with BiPAP on  CV: RRR, no murmurs, no rubs  Ext: Trace pitting edema of the bilateral LEs.   Assessment/Plan:  Ms. [name] [name] is a 68 yo female with possible COPD, diastolic heart failure,GERD, depressionand anxiety presenting forworsening shortness of breath. CXR was consistent diffuse reticular pattern. She was found to be hypoxic but saturating well on 4L supplemental oxygen.  Acute on chronic respiratory failure with hypoxia - Remains afebrile. Antibiotics discontinued on 1/25. BC with NGTD - Patient's respiratory status worsened overnight, requiring BiPAP - ABG was significant for a respiratory acidosis  - Echocardiogram was obtained that illustrated a normal LVEF, mild-moderate MR, LAE, RAE, right ventricular enlargement, and severely elevated PA pressures (71 mmHg) - Awaiting CT chest read, but appears to have diffuse parenchymal disease - Autoimmune labs positive for ANA, anti-Ro, anti-La, c-ANCA - Based on the above the patient likely has diffuse parenchymal disease leading to pulmonary HTN. - Start pulse dose steroids (methylprednisolone 500 mg QD for 3 days, 1/3) and diuretic therapy once BP improved - Spirometry still pending  - Continuous pulse ox.  Alternate off BiPAP as tolerated. Use HFNC when off. Goal SpO2 88-92% - Continue Dulera bid, albuterol nebulizer as needed  - Mucinex 600 mg bid  - Cardiac monitoring  Diastolic HF - Will initiate diuretic therapy with IV furosemide 20 mg QD once BP allows - Continue to monitor   Dispo: Anticipated discharge once respiratory status has improved.   Levora Dredge, MD 04/13/2018, 7:55 AM

## 2018-04-14 ENCOUNTER — Inpatient Hospital Stay (HOSPITAL_COMMUNITY): Payer: Medicare HMO

## 2018-04-14 DIAGNOSIS — J9601 Acute respiratory failure with hypoxia: Secondary | ICD-10-CM

## 2018-04-14 DIAGNOSIS — A419 Sepsis, unspecified organism: Secondary | ICD-10-CM

## 2018-04-14 DIAGNOSIS — R6521 Severe sepsis with septic shock: Secondary | ICD-10-CM

## 2018-04-14 LAB — BASIC METABOLIC PANEL
Anion gap: 11 (ref 5–15)
BUN: 41 mg/dL — ABNORMAL HIGH (ref 8–23)
CO2: 18 mmol/L — ABNORMAL LOW (ref 22–32)
Calcium: 8.5 mg/dL — ABNORMAL LOW (ref 8.9–10.3)
Chloride: 107 mmol/L (ref 98–111)
Creatinine, Ser: 1.34 mg/dL — ABNORMAL HIGH (ref 0.44–1.00)
GFR calc Af Amer: 47 mL/min — ABNORMAL LOW (ref >60)
GFR calc non Af Amer: 40 mL/min — ABNORMAL LOW (ref >60)
Glucose, Bld: 115 mg/dL — ABNORMAL HIGH (ref 70–99)
Potassium: 4.7 mmol/L (ref 3.5–5.1)
Sodium: 136 mmol/L (ref 135–145)

## 2018-04-14 LAB — CBC
HCT: 33.3 % — ABNORMAL LOW (ref 36.0–46.0)
Hemoglobin: 9.6 g/dL — ABNORMAL LOW (ref 12.0–15.0)
MCH: 31.9 pg (ref 26.0–34.0)
MCHC: 28.8 g/dL — ABNORMAL LOW (ref 30.0–36.0)
MCV: 110.6 fL — ABNORMAL HIGH (ref 80.0–100.0)
PLATELETS: 237 10*3/uL (ref 150–400)
RBC: 3.01 MIL/uL — ABNORMAL LOW (ref 3.87–5.11)
RDW: 14.6 % (ref 11.5–15.5)
WBC: 18.8 10*3/uL — AB (ref 4.0–10.5)
nRBC: 0.1 % (ref 0.0–0.2)

## 2018-04-14 LAB — POCT I-STAT 7, (LYTES, BLD GAS, ICA,H+H)
Acid-base deficit: 2 mmol/L (ref 0.0–2.0)
Acid-base deficit: 5 mmol/L — ABNORMAL HIGH (ref 0.0–2.0)
Bicarbonate: 24.5 mmol/L (ref 20.0–28.0)
Bicarbonate: 19.9 mmol/L — ABNORMAL LOW (ref 20.0–28.0)
Calcium, Ion: 1.26 mmol/L (ref 1.15–1.40)
Calcium, Ion: 1.24 mmol/L (ref 1.15–1.40)
HCT: 25 % — ABNORMAL LOW (ref 36.0–46.0)
HCT: 26 % — ABNORMAL LOW (ref 36.0–46.0)
Hemoglobin: 8.5 g/dL — ABNORMAL LOW (ref 12.0–15.0)
Hemoglobin: 8.8 g/dL — ABNORMAL LOW (ref 12.0–15.0)
O2 Saturation: 99 %
O2 Saturation: 97 %
Potassium: 4.8 mmol/L (ref 3.5–5.1)
Potassium: 4.2 mmol/L (ref 3.5–5.1)
Sodium: 136 mmol/L (ref 135–145)
Sodium: 138 mmol/L (ref 135–145)
TCO2: 26 mmol/L (ref 22–32)
TCO2: 21 mmol/L — ABNORMAL LOW (ref 22–32)
pCO2 arterial: 50.6 mmHg — ABNORMAL HIGH (ref 32.0–48.0)
pCO2 arterial: 33.2 mmHg (ref 32.0–48.0)
pH, Arterial: 7.292 — ABNORMAL LOW (ref 7.350–7.450)
pH, Arterial: 7.386 (ref 7.350–7.450)
pO2, Arterial: 159 mmHg — ABNORMAL HIGH (ref 83.0–108.0)
pO2, Arterial: 92 mmHg (ref 83.0–108.0)

## 2018-04-14 LAB — GLUCOSE, CAPILLARY
Glucose-Capillary: 112 mg/dL — ABNORMAL HIGH (ref 70–99)
Glucose-Capillary: 114 mg/dL — ABNORMAL HIGH (ref 70–99)
Glucose-Capillary: 120 mg/dL — ABNORMAL HIGH (ref 70–99)
Glucose-Capillary: 122 mg/dL — ABNORMAL HIGH (ref 70–99)
Glucose-Capillary: 124 mg/dL — ABNORMAL HIGH (ref 70–99)
Glucose-Capillary: 126 mg/dL — ABNORMAL HIGH (ref 70–99)
Glucose-Capillary: 129 mg/dL — ABNORMAL HIGH (ref 70–99)

## 2018-04-14 LAB — STREP PNEUMONIAE URINARY ANTIGEN: Strep Pneumo Urinary Antigen: NEGATIVE

## 2018-04-14 LAB — PROCALCITONIN: Procalcitonin: 0.72 ng/mL

## 2018-04-14 MED ORDER — SODIUM CHLORIDE 0.9 % IV SOLN
INTRAVENOUS | Status: DC | PRN
  Administered 2018-04-14: 1000 mL via INTRAVENOUS
  Administered 2018-04-15: 16:00:00 via INTRAVENOUS

## 2018-04-14 MED ORDER — ASPIRIN 81 MG PO CHEW
81.0000 mg | CHEWABLE_TABLET | Freq: Every day | ORAL | Status: DC
  Administered 2018-04-14 – 2018-04-17 (×4): 81 mg
  Filled 2018-04-14 (×4): qty 1

## 2018-04-14 MED ORDER — LACTATED RINGERS IV BOLUS
500.0000 mL | Freq: Once | INTRAVENOUS | Status: AC
  Administered 2018-04-14: 500 mL via INTRAVENOUS

## 2018-04-14 MED ORDER — ONDANSETRON HCL 4 MG/5ML PO SOLN
4.0000 mg | Freq: Three times a day (TID) | ORAL | Status: DC | PRN
  Filled 2018-04-14: qty 5

## 2018-04-14 MED ORDER — PANTOPRAZOLE SODIUM 40 MG IV SOLR
40.0000 mg | INTRAVENOUS | Status: DC
  Administered 2018-04-14 – 2018-04-17 (×4): 40 mg via INTRAVENOUS
  Filled 2018-04-14 (×5): qty 40

## 2018-04-14 MED ORDER — CLONAZEPAM 1 MG PO TABS
1.0000 mg | ORAL_TABLET | Freq: Two times a day (BID) | ORAL | Status: DC
  Administered 2018-04-14 – 2018-04-17 (×7): 1 mg
  Filled 2018-04-14 (×7): qty 1

## 2018-04-14 MED ORDER — PHENYLEPHRINE HCL-NACL 40-0.9 MG/250ML-% IV SOLN
0.0000 ug/min | INTRAVENOUS | Status: DC
  Administered 2018-04-14: 80 ug/min via INTRAVENOUS
  Administered 2018-04-15: 110 ug/min via INTRAVENOUS
  Administered 2018-04-15: 100 ug/min via INTRAVENOUS
  Filled 2018-04-14 (×3): qty 250

## 2018-04-14 MED ORDER — SENNOSIDES-DOCUSATE SODIUM 8.6-50 MG PO TABS
1.0000 | ORAL_TABLET | Freq: Two times a day (BID) | ORAL | Status: DC
  Administered 2018-04-14 – 2018-04-17 (×7): 1
  Filled 2018-04-14 (×8): qty 1

## 2018-04-14 MED ORDER — POLYETHYLENE GLYCOL 3350 17 G PO PACK
17.0000 g | PACK | Freq: Every evening | ORAL | Status: DC
  Administered 2018-04-14 – 2018-04-16 (×3): 17 g
  Filled 2018-04-14 (×4): qty 1

## 2018-04-14 MED ORDER — ACETAMINOPHEN 160 MG/5ML PO SOLN: 650.0000 mg | Freq: Four times a day (QID) | ORAL | Status: DC | PRN

## 2018-04-14 MED ORDER — FLUOXETINE HCL 10 MG PO CAPS
30.0000 mg | ORAL_CAPSULE | Freq: Every day | ORAL | Status: DC
  Administered 2018-04-14 – 2018-04-17 (×4): 30 mg
  Filled 2018-04-14 (×4): qty 3

## 2018-04-14 NOTE — Plan of Care (Signed)
  Problem: Activity: Goal: Ability to tolerate increased activity will improve 04/14/2018 2121 by Della Goo, RN Outcome: Progressing 04/14/2018 2120 by Della Goo, RN Outcome: Progressing   Problem: Respiratory: Goal: Ability to maintain a clear airway and adequate ventilation will improve Outcome: Progressing Note:  ETT   Problem: Role Relationship: Goal: Method of communication will improve Outcome: Progressing

## 2018-04-14 NOTE — Progress Notes (Signed)
Initial Nutrition Assessment  DOCUMENTATION CODES:   Non-severe (moderate) malnutrition in context of chronic illness  INTERVENTION:   Recommend start TF via OGT:  Vital AF 1.2 at 50 ml/h (1200 ml per day)  Provides 1440 kcal, 90 gm protein, 973 ml free water daily  NUTRITION DIAGNOSIS:   Moderate Malnutrition related to chronic illness(COPD) as evidenced by mild muscle depletion, mild fat depletion, mild edema.  GOAL:   Patient will meet greater than or equal to 90% of their needs  MONITOR:   Vent status, Skin, I & O's  REASON FOR ASSESSMENT:   Ventilator    ASSESSMENT:   68 yo female with PMH of COPD, acute renal failure, HTN, CHF, anxiety who was admitted with SOB. Transferred to the ICU and intubated on 1/26 with worsening viral vs bacterial PNA, possible ARDS.   Patient is currently intubated on ventilator support MV: 7.8 L/min Temp (24hrs), Avg:97.8 F (36.6 C), Min:97.3 F (36.3 C), Max:98 F (36.7 C)    Labs reviewed. CBG's: 112-126-120 Medications reviewed and include neosynephrine, solumedrol, novolog, miralax, KCl, Senokot.  No recent weight changes per review of weight encounters. Suspect moderate malnutrition given  NUTRITION - FOCUSED PHYSICAL EXAM:    Most Recent Value  Orbital Region  Moderate depletion  Upper Arm Region  No depletion  Thoracic and Lumbar Region  No depletion  Buccal Region  Mild depletion  Temple Region  Mild depletion  Clavicle Bone Region  No depletion  Clavicle and Acromion Bone Region  No depletion  Scapular Bone Region  Unable to assess  Dorsal Hand  No depletion  Patellar Region  No depletion  Anterior Thigh Region  Mild depletion  Posterior Calf Region  No depletion  Edema (RD Assessment)  Mild  Hair  Reviewed  Eyes  Unable to assess  Mouth  Unable to assess  Skin  Reviewed  Nails  Reviewed       Diet Order:   Diet Order            Diet NPO time specified  Diet effective now               EDUCATION NEEDS:   No education needs have been identified at this time  Skin:  Skin Assessment: Skin Integrity Issues: Skin Integrity Issues:: Stage II Stage II: L buttocks  Last BM:  1/27  Height:   Ht Readings from Last 1 Encounters:  04/08/2018 5\' 5"  (1.651 m)    Weight:   Wt Readings from Last 1 Encounters:  04/14/18 76.4 kg    Ideal Body Weight:  56.8 kg  BMI:  Body mass index is 28.03 kg/m.  Estimated Nutritional Needs:   Kcal:  1450  Protein:  90-110 gm  Fluid:  > 1.5 L    Joaquin Courts, RD, LDN, CNSC Pager [phone] After Hours Pager [phone]

## 2018-04-14 NOTE — Plan of Care (Signed)
  Problem: Clinical Measurements: Goal: Diagnostic test results will improve Outcome: Not Progressing Goal: Cardiovascular complication will be avoided Outcome: Not Progressing

## 2018-04-14 NOTE — Progress Notes (Addendum)
NAME:  Jesse FallJudy Westerfeld, MRN:  [id number], DOB:  12-May-1950, LOS: 3 ADMISSION DATE:  10/20/2018, CONSULTATION DATE:  04/13/18 REFERRING MD:  Danise EdgeIMTS, CHIEF COMPLAINT:  Worsening Acute on Chronic Respiratory Failure   Brief History   68 year old female former  smoker ( 40 pack year smoking history quit 2002)  with COPD, Pulmonary HTN, and history of pneumonia x 4 in the last 12 months ( twice in the last month) [facility]/24, [facility] with worsening respiratory distress x 3 days. She had been diagnosed with pneumonia 1/22.  Saturations were 68% on 2 L per EMS notes . She was admitted and treated with  Antibiotics x 1 day. She has become progressively hypoxic despite increased oxygen support and BiPAP since 1/25 .PCCM have been asked to evaluate patient for abnormal CT Chest and worsening hypoxemia.  Past Medical History  COPD, Pulmonary HTN, Hx of recurrent PNA, CHF, HTN  Significant Hospital Events   1/24 Admit 1/26 Transfer to ICU  Consults:  PCCM   Procedures:  1/26 ETT   Significant Diagnostic Tests:   CT Chest 1/25 Diffuse progressive bilateral ground glass opacities compatible with ARDS  vs acute interstitial pneumonitis.Additionally, small bilateral pleural effusions , and mediastinal adenopathy reactive vs. Inflammation/ infection, metastatic adenopathy, or lymphoproliferative disorder. No significant bronchiectasis or peripheral interstitial reticulation. Question mild focal area of subpleural honeycombing within the posterolateral left upper lobe and anterior right upper lobe  Echo 04/12/2018 EF 60-65%, mild aortic stenosis and regurgitation,MV: mild to moderate regurgitation,LA with mild dialtion, RV with mild dilation,RA with mild dilation,Severe tricuspid regurgitation, PAPP 71 mm Hg  Micro Data:  1/24 RVP>> Negative 1/24 Blood Cultures>> NGTD 1/26>> Urine legionella 1/26>> Urine strep 1/27>> Resp cultures  Antimicrobials:  1/26 Vanc >>  1/26 Cefepime >>     Interim history/subjective:  Sedated on vent. Failed BiPAP and intubated yesterday after transfer to ICU.   Objective   Blood pressure (!) 111/53, pulse 74, temperature 98 F (36.7 C), temperature source Oral, resp. rate (!) 21, height 5\' 5"  (1.651 m), weight 76.4 kg, SpO2 95 %.    Vent Mode: PRVC FiO2 (%):  [40 %-100 %] 40 % Set Rate:  [18 bmp-22 bmp] 22 bmp Vt Set:  [340 mL] 340 mL PEEP:  [8 cmH20-10 cmH20] 8 cmH20 Plateau Pressure:  [26 cmH20-29 cmH20] 26 cmH20   Intake/Output Summary (Last 24 hours) at 04/14/2018 0657 Last data filed at 04/14/2018 0600 Gross per 24 hour  Intake 2338.96 ml  Output 285 ml  Net 2053.96 ml   Filed Weights   01/06/19 1053 04/14/18 0400  Weight: 88.5 kg 76.4 kg    Examination: General: Unresponsive on vent, NAD HENT: PERRL, +JVD Lungs: Bilateral rhonchi, no wheezing  Cardiovascular: RRR, no m/r/g Abdomen: soft, non distended, hypoactive BS Extremities: warm, no edema  Neuro: RASS -4  Resolved Hospital Problem list     Assessment & Plan:   Acute on Chronic Hypoxic and Hypercarbic Respiratory Failure: Concern for worsening viral vs bacterial PNA, possible ARDS. Autoimmune pneumonitis is also a consideration. -- Full vent support, ARDS settings 6 cc/kg  -- Repeat ABG -- Continue empiric abx  -- Stress dose steroids  -- Obtain resp cultures -- Fentanyl gtt, PRN versed for RASS goal 0 to -1  -- Will defer SBT today   Septic Shock: worsening leukocytosis and procalcitonin elevated -- Phenylephrine to goal MAP > 65 -- Empiric abx -- Follow cultures.   HFpEF: Diastolic HF exacerbation with pulmonary edema could  be contributing to her respiratory failure, but diuresis is limited with hypotension. -- I&Os -- Holding diuresis   Best practice:  Diet: NPO Pain/Anxiety/Delirium protocol (if indicated): Fentanyl gtt, intermittent versed VAP protocol (if indicated): ordered DVT prophylaxis: Lovenox  GI prophylaxis: Protonix  Glucose  control: CBG checks Mobility: Bedrest  Code Status: Full  Family Communication: None at bedside  Disposition: Remain in ICU   Reymundo Pollarolyn Atticus Wedin, M.D. - PGY3 Pager: [phone] 04/14/2018, 6:58 AM

## 2018-04-14 NOTE — Procedures (Signed)
Central Venous Catheter Insertion Procedure Note [name] [name] [id number] 12/22/1950  Procedure: Insertion of Central Venous Catheter Indications: Drug and/or fluid administration  Procedure Details Consent: Risks of procedure as well as the alternatives and risks of each were explained to the (patient/caregiver).  Consent for procedure obtained. Time Out: Verified patient identification, verified procedure, site/side was marked, verified correct patient position, special equipment/implants available, medications/allergies/relevent history reviewed, required imaging and test results available.  Performed  Maximum sterile technique was used including antiseptics, cap, gloves, gown, hand hygiene, mask and sheet. Skin prep: Chlorhexidine; local anesthetic administered A antimicrobial bonded/coated triple lumen catheter was placed in the left internal jugular vein using the Seldinger technique.  Evaluation Blood flow good Complications: No apparent complications Patient did tolerate procedure well. Chest X-ray ordered to verify placement.  CXR: pending.  [name] [name] 04/14/2018, 4:52 PM

## 2018-04-15 ENCOUNTER — Other Ambulatory Visit: Payer: Self-pay | Admitting: *Deleted

## 2018-04-15 ENCOUNTER — Inpatient Hospital Stay (HOSPITAL_COMMUNITY): Payer: Medicare HMO

## 2018-04-15 LAB — MAGNESIUM: Magnesium: 2.5 mg/dL — ABNORMAL HIGH (ref 1.7–2.4)

## 2018-04-15 LAB — CBC
HEMATOCRIT: 33.4 % — AB (ref 36.0–46.0)
Hemoglobin: 10 g/dL — ABNORMAL LOW (ref 12.0–15.0)
MCH: 32.4 pg (ref 26.0–34.0)
MCHC: 29.9 g/dL — ABNORMAL LOW (ref 30.0–36.0)
MCV: 108.1 fL — AB (ref 80.0–100.0)
Platelets: 234 10*3/uL (ref 150–400)
RBC: 3.09 MIL/uL — ABNORMAL LOW (ref 3.87–5.11)
RDW: 14.9 % (ref 11.5–15.5)
WBC: 17.3 10*3/uL — ABNORMAL HIGH (ref 4.0–10.5)
nRBC: 0.1 % (ref 0.0–0.2)

## 2018-04-15 LAB — POCT I-STAT 7, (LYTES, BLD GAS, ICA,H+H)
Acid-base deficit: 8 mmol/L — ABNORMAL HIGH (ref 0.0–2.0)
Acid-base deficit: 7 mmol/L — ABNORMAL HIGH (ref 0.0–2.0)
Bicarbonate: 20.8 mmol/L (ref 20.0–28.0)
Bicarbonate: 21 mmol/L (ref 20.0–28.0)
Calcium, Ion: 1.29 mmol/L (ref 1.15–1.40)
Calcium, Ion: 1.31 mmol/L (ref 1.15–1.40)
HCT: 41 % (ref 36.0–46.0)
HCT: 28 % — ABNORMAL LOW (ref 36.0–46.0)
Hemoglobin: 13.9 g/dL (ref 12.0–15.0)
Hemoglobin: 9.5 g/dL — ABNORMAL LOW (ref 12.0–15.0)
O2 Saturation: 85 %
O2 Saturation: 89 %
Patient temperature: 98.3
Potassium: 4.7 mmol/L (ref 3.5–5.1)
Potassium: 4.7 mmol/L (ref 3.5–5.1)
Sodium: 141 mmol/L (ref 135–145)
Sodium: 141 mmol/L (ref 135–145)
TCO2: 23 mmol/L (ref 22–32)
TCO2: 23 mmol/L (ref 22–32)
pCO2 arterial: 56.6 mmHg — ABNORMAL HIGH (ref 32.0–48.0)
pCO2 arterial: 52.3 mmHg — ABNORMAL HIGH (ref 32.0–48.0)
pH, Arterial: 7.173 — CL (ref 7.350–7.450)
pH, Arterial: 7.212 — ABNORMAL LOW (ref 7.350–7.450)
pO2, Arterial: 62 mmHg — ABNORMAL LOW (ref 83.0–108.0)
pO2, Arterial: 69 mmHg — ABNORMAL LOW (ref 83.0–108.0)

## 2018-04-15 LAB — BASIC METABOLIC PANEL
Anion gap: 10 (ref 5–15)
BUN: 58 mg/dL — ABNORMAL HIGH (ref 8–23)
CO2: 20 mmol/L — ABNORMAL LOW (ref 22–32)
Calcium: 8.4 mg/dL — ABNORMAL LOW (ref 8.9–10.3)
Chloride: 108 mmol/L (ref 98–111)
Creatinine, Ser: 1.71 mg/dL — ABNORMAL HIGH (ref 0.44–1.00)
GFR calc Af Amer: 35 mL/min — ABNORMAL LOW (ref >60)
GFR calc non Af Amer: 30 mL/min — ABNORMAL LOW (ref >60)
Glucose, Bld: 131 mg/dL — ABNORMAL HIGH (ref 70–99)
Potassium: 4.7 mmol/L (ref 3.5–5.1)
Sodium: 138 mmol/L (ref 135–145)

## 2018-04-15 LAB — LEGIONELLA PNEUMOPHILA SEROGP 1 UR AG: L. pneumophila Serogp 1 Ur Ag: NEGATIVE

## 2018-04-15 LAB — GLUCOSE, CAPILLARY
Glucose-Capillary: 112 mg/dL — ABNORMAL HIGH (ref 70–99)
Glucose-Capillary: 114 mg/dL — ABNORMAL HIGH (ref 70–99)
Glucose-Capillary: 124 mg/dL — ABNORMAL HIGH (ref 70–99)
Glucose-Capillary: 131 mg/dL — ABNORMAL HIGH (ref 70–99)

## 2018-04-15 LAB — PROCALCITONIN: Procalcitonin: 0.63 ng/mL

## 2018-04-15 LAB — PHOSPHORUS: Phosphorus: 5.6 mg/dL — ABNORMAL HIGH (ref 2.5–4.6)

## 2018-04-15 LAB — CREATININE, URINE, RANDOM: Creatinine, Urine: 140.77 mg/dL

## 2018-04-15 LAB — SODIUM, URINE, RANDOM: Sodium, Ur: <10 mmol/L

## 2018-04-15 MED ORDER — LACTATED RINGERS IV BOLUS
1000.0000 mL | Freq: Once | INTRAVENOUS | Status: AC
  Administered 2018-04-15: 1000 mL via INTRAVENOUS

## 2018-04-15 MED ORDER — CHLORHEXIDINE GLUCONATE CLOTH 2 % EX PADS
6.0000 | MEDICATED_PAD | Freq: Every day | CUTANEOUS | Status: DC
  Administered 2018-04-15 – 2018-04-17 (×3): 6 via TOPICAL

## 2018-04-15 MED ORDER — METHYLPREDNISOLONE SODIUM SUCC 125 MG IJ SOLR: 125.0000 mg | Freq: Every day | INTRAMUSCULAR | Status: DC

## 2018-04-15 MED ORDER — NOREPINEPHRINE-SODIUM CHLORIDE 4-0.9 MG/250ML-% IV SOLN
0.0000 ug/min | INTRAVENOUS | Status: DC
  Administered 2018-04-15: 8 ug/min via INTRAVENOUS
  Administered 2018-04-15: 10 ug/min via INTRAVENOUS
  Administered 2018-04-16 (×2): 6 ug/min via INTRAVENOUS
  Filled 2018-04-15 (×6): qty 250

## 2018-04-15 MED ORDER — METHYLPREDNISOLONE SODIUM SUCC 125 MG IJ SOLR
125.0000 mg | Freq: Every day | INTRAMUSCULAR | Status: AC
  Administered 2018-04-16 – 2018-04-17 (×2): 125 mg via INTRAVENOUS
  Filled 2018-04-15 (×2): qty 2

## 2018-04-15 MED ORDER — SODIUM CHLORIDE 0.9% FLUSH
10.0000 mL | Freq: Two times a day (BID) | INTRAVENOUS | Status: DC
  Administered 2018-04-15 – 2018-04-17 (×4): 10 mL

## 2018-04-15 MED ORDER — VITAL AF 1.2 CAL PO LIQD
1000.0000 mL | ORAL | Status: DC
  Administered 2018-04-15 – 2018-04-16 (×2): 1000 mL

## 2018-04-15 MED ORDER — PRO-STAT SUGAR FREE PO LIQD: 30.0000 mL | Freq: Two times a day (BID) | ORAL | Status: DC

## 2018-04-15 MED ORDER — SODIUM CHLORIDE 0.9% FLUSH: 10.0000 mL | INTRAVENOUS | Status: DC | PRN

## 2018-04-15 NOTE — Progress Notes (Signed)
Nutrition Follow-up / Consult  DOCUMENTATION CODES:   Non-severe (moderate) malnutrition in context of chronic illness  INTERVENTION:    Vital AF 1.2 at 50 ml/h (1200 ml per day)  Provides 1440 kcal, 90 gm protein, 973 ml free water daily  NUTRITION DIAGNOSIS:   Moderate Malnutrition related to chronic illness(COPD) as evidenced by mild muscle depletion, mild fat depletion, edema.  Ongoing  GOAL:   Patient will meet greater than or equal to 90% of their needs  Being addressed with TF initiation  MONITOR:   Vent status, Skin, I & O's  REASON FOR ASSESSMENT:   Consult Enteral/tube feeding initiation and management  ASSESSMENT:   68 yo female with PMH of COPD, acute renal failure, HTN, CHF, anxiety who was admitted with SOB. Transferred to the ICU and intubated on 1/26 with worsening viral vs bacterial PNA, possible ARDS.  Received MD Consult for TF initiation and management. OGT in place.  UOP is declining, now oliguric, I/O +4.9 L since admission. Noted dialysis is not in line with goals of care.   Patient remains intubated on ventilator support MV: 6.5 L/min Temp (24hrs), Avg:98 F (36.7 C), Min:97.6 F (36.4 C), Max:98.3 F (36.8 C)   Labs reviewed. Phosphorus 5.6 (H), magnesium 2.5 (H) CBG's: 114-124 Medications reviewed and include novolog, solumedrol, KCl, Senokot, Miralax, levophed.   Diet Order:   Diet Order            Diet NPO time specified  Diet effective now              EDUCATION NEEDS:   No education needs have been identified at this time  Skin:  Skin Assessment: Skin Integrity Issues: Skin Integrity Issues:: Stage II Stage II: L buttocks  Last BM:  1/27  Height:   Ht Readings from Last 1 Encounters:  Apr 15, 2018 5\' 5"  (1.651 m)    Weight:   Wt Readings from Last 1 Encounters:  04/15/18 79 kg    Ideal Body Weight:  56.8 kg  BMI:  Body mass index is 28.98 kg/m.  Estimated Nutritional Needs:   Kcal:   1450  Protein:  90-110 gm  Fluid:  > 1.5 L    Joaquin Courts, RD, LDN, CNSC Pager [phone] After Hours Pager [phone]

## 2018-04-15 NOTE — Patient Outreach (Signed)
[facility]) Care Management  04/15/2018  [name] [name] 08-15-1950 [id number]   Subjective:Case discussed with George Ina RNCM at [facility] Care Management. Patient remains inpatient.  CHL in-basket message update sent to [facility] Liaisons (Atika Sarasota and Charlesetta Shanks).      Objective:Per KPN (Knowledge Performance Now, point of care tool) and chart review,patient hospitalized 02/28/18-03/06/18 forCommunity acquired pneumonia of left lung, patient discharged from hospital to [facility]. Patient had ED visit on 04/03/2018 for shortness of breath. Patient discharged from [address] on 04/04/2018. Patient also has a history of hypertension, COPD, congestive heart failure, malnutrition, and home oxygen at 4 liters.     Assessment: Received [employer] Transition of care referral on 04/08/2018. Transition of care follow up from Skilled nursing facility discharge not completed, patient remains inpatient, this RNCM will close case, [facility] Liaisons will follow up for discharge disposition.       Plan:Case closure due to does not meet program criteria, current inpatient acute hospital admission.     Madalen Gavin H. Gardiner Barefoot, BSN, CCM [facility] Care Management [facility] Telephonic CM Phone: [phone] Fax: [phone]        Matteson Blue H. Gardiner Barefoot, BSN, CCM [facility] Care Management [facility] Telephonic CM Phone: [phone] Fax: [phone]

## 2018-04-15 NOTE — Progress Notes (Addendum)
NAME:  [name] [name], MRN:  [id number], DOB:  1950-11-04, LOS: 4 ADMISSION DATE:  2018/04/12, CONSULTATION DATE:  04/13/18 REFERRING MD:  Danise Edge, CHIEF COMPLAINT:  Worsening Acute on Chronic Respiratory Failure   Brief History   68 year old female former  smoker ( 40 pack year smoking history quit 2002)  with COPD, Pulmonary HTN, and history of pneumonia x 4 in the last 12 months ( twice in the last month) presents 1/24, from [facility] with worsening respiratory distress x 3 days. She had been diagnosed with pneumonia 1/22.  Saturations were 68% on 2 L per EMS notes . She was admitted and treated with  Antibiotics x 1 day. She has become progressively hypoxic despite increased oxygen support and BiPAP since 1/25 .PCCM have been asked to evaluate patient for abnormal CT Chest and worsening hypoxemia.  Past Medical History  COPD, Pulmonary HTN, Hx of recurrent PNA, CHF, HTN  Significant Hospital Events   1/24 Admit 1/26 Transfer to ICU  Consults:  PCCM   Procedures:  1/26 ETT  1/27 Right IJ CVL  Significant Diagnostic Tests:   CT Chest 1/25 Diffuse progressive bilateral ground glass opacities compatible with ARDS  vs acute interstitial pneumonitis.Additionally, small bilateral pleural effusions , and mediastinal adenopathy reactive vs. Inflammation/ infection, metastatic adenopathy, or lymphoproliferative disorder. No significant bronchiectasis or peripheral interstitial reticulation. Question mild focal area of subpleural honeycombing within the posterolateral left upper lobe and anterior right upper lobe  Echo 04/12/2018 EF 60-65%, mild aortic stenosis and regurgitation,MV: mild to moderate regurgitation,LA with mild dialtion, RV with mild dilation,RA with mild dilation,Severe tricuspid regurgitation, PAPP 71 mm Hg  Micro Data:  1/24 RVP>> Negative 1/24 Blood Cultures>> NGTD 1/26>> Urine legionella 1/26>> Urine strep 1/27>> Resp cultures  Antimicrobials:  1/26 Vanc >> dc'd  1/28 1/26 Cefepime >>    Interim history/subjective:  Sedated on vent.  Objective   Blood pressure (!) 115/48, pulse 75, temperature 98 F (36.7 C), temperature source Oral, resp. rate 18, height 5\' 5"  (1.651 m), weight 79 kg, SpO2 95 %.    Vent Mode: PRVC FiO2 (%):  [40 %] 40 % Set Rate:  [22 bmp] 22 bmp Vt Set:  [340 mL] 340 mL PEEP:  [8 cmH20] 8 cmH20 Plateau Pressure:  [23 cmH20-31 cmH20] 26 cmH20   Intake/Output Summary (Last 24 hours) at 04/15/2018 0659 Last data filed at 04/15/2018 0600 Gross per 24 hour  Intake 3243.76 ml  Output 173 ml  Net 3070.76 ml   Filed Weights   04-12-18 1053 04/14/18 0400 04/15/18 0500  Weight: 88.5 kg 76.4 kg 79 kg    Examination: General: Sedated on vent, NAD HENT: PERRL, right IJ CVL Lungs: Bilateral rhonchi, no wheezing  Cardiovascular: RRR, no m/r/g Abdomen: soft, non distended, hypoactive BS Extremities: warm, no edema  Neuro: RASS -4  Resolved Hospital Problem list     Assessment & Plan:   Acute on Chronic Hypoxic and Hypercarbic Respiratory Failure: Concern for worsening viral vs bacterial PNA, possible ARDS. Autoimmune pneumonitis is also a consideration. Patient has expressed that she does not wish for long term intubation. Family is in agreement.  -- Full vent support, ARDS settings 6 cc/kg  -- Respiratory acidosis with hypercapnia this morning, increase RR to 24 -- Repeat PM ABG -- Continue empiric cefepime. Stop vancomycin.  -- Stress dose steroids  -- Respiratory cultures NGTD  -- Fentanyl gtt, PRN versed for RASS goal 0 to -1   Septic Shock: Persistent leukocytosis, afebrile.  --  Change phenylephrine to levophed, to goal MAP > 65 -- Empiric abx -- Follow cultures.   Oliguric AKI: UOP continues to decline, now oliguric. Discussed with family, dialysis is not in line with patient's goals of care.  -- Check FeNA -- 1L LR bolus  -- Strict I&Os  HFpEF: Diastolic HF exacerbation with pulmonary edema could be  contributing to her respiratory failure, but diuresis is limited with hypotension. Now in oliguric renal failure. IVC on echo yesterday appeared normal. -- I&Os -- Holding diuresis   Goals of Care: Currently listed as full code, need to readdress with family today as this seems inconsistent with their expressed wishes.   Best practice:  Diet: NPO Pain/Anxiety/Delirium protocol (if indicated): Fentanyl gtt, intermittent versed VAP protocol (if indicated): ordered DVT prophylaxis: Lovenox  GI prophylaxis: Protonix  Glucose control: CBG checks Mobility: Bedrest  Code Status: Full - need to readdress today.  Family Communication:  Updated brother via phone as well as brother & sister-in-law (HCPOA) at bedside yesterday 1/27. Disposition: Remain in ICU   Reymundo Poll, M.D. - PGY3 Pager: [phone] 04/15/2018, 6:59 AM

## 2018-04-15 NOTE — Consult Note (Addendum)
   [facility] CM Inpatient Consult   04/15/2018  [name] [name] 11-30-1950 [id number]   Made aware of hospitalization by [facility] RN Telephonic RNCM.  However, patient is now admitted to ICU on vent.  Will continue to follow along for progression and disposition plans. Will engage patient to discuss [facility] Care Management follow up when appropriate.   Raiford Noble, MSN-Ed, RN,BSN [facility] Liaison [phone]

## 2018-04-16 ENCOUNTER — Inpatient Hospital Stay (HOSPITAL_COMMUNITY): Payer: Medicare HMO

## 2018-04-16 DIAGNOSIS — I5031 Acute diastolic (congestive) heart failure: Secondary | ICD-10-CM

## 2018-04-16 LAB — CULTURE, RESPIRATORY W GRAM STAIN: Culture: NORMAL

## 2018-04-16 LAB — POCT I-STAT 7, (LYTES, BLD GAS, ICA,H+H)
Acid-base deficit: 7 mmol/L — ABNORMAL HIGH (ref 0.0–2.0)
Bicarbonate: 21.3 mmol/L (ref 20.0–28.0)
Calcium, Ion: 1.34 mmol/L (ref 1.15–1.40)
HCT: 28 % — ABNORMAL LOW (ref 36.0–46.0)
Hemoglobin: 9.5 g/dL — ABNORMAL LOW (ref 12.0–15.0)
O2 Saturation: 95 %
Patient temperature: 98
Potassium: 4.8 mmol/L (ref 3.5–5.1)
Sodium: 142 mmol/L (ref 135–145)
TCO2: 23 mmol/L (ref 22–32)
pCO2 arterial: 54.4 mmHg — ABNORMAL HIGH (ref 32.0–48.0)
pH, Arterial: 7.199 — CL (ref 7.350–7.450)
pO2, Arterial: 94 mmHg (ref 83.0–108.0)

## 2018-04-16 LAB — BASIC METABOLIC PANEL
Anion gap: 5 (ref 5–15)
BUN: 68 mg/dL — ABNORMAL HIGH (ref 8–23)
CO2: 21 mmol/L — AB (ref 22–32)
CREATININE: 1.69 mg/dL — AB (ref 0.44–1.00)
Calcium: 8.5 mg/dL — ABNORMAL LOW (ref 8.9–10.3)
Chloride: 114 mmol/L — ABNORMAL HIGH (ref 98–111)
GFR calc Af Amer: 35 mL/min — ABNORMAL LOW (ref >60)
GFR calc non Af Amer: 30 mL/min — ABNORMAL LOW (ref >60)
Glucose, Bld: 177 mg/dL — ABNORMAL HIGH (ref 70–99)
Potassium: 4.7 mmol/L (ref 3.5–5.1)
Sodium: 140 mmol/L (ref 135–145)

## 2018-04-16 LAB — CULTURE, BLOOD (ROUTINE X 2)
Culture: NO GROWTH
Culture: NO GROWTH

## 2018-04-16 LAB — GLUCOSE, CAPILLARY
Glucose-Capillary: 120 mg/dL — ABNORMAL HIGH (ref 70–99)
Glucose-Capillary: 148 mg/dL — ABNORMAL HIGH (ref 70–99)
Glucose-Capillary: 148 mg/dL — ABNORMAL HIGH (ref 70–99)
Glucose-Capillary: 153 mg/dL — ABNORMAL HIGH (ref 70–99)
Glucose-Capillary: 155 mg/dL — ABNORMAL HIGH (ref 70–99)
Glucose-Capillary: 160 mg/dL — ABNORMAL HIGH (ref 70–99)
Glucose-Capillary: 191 mg/dL — ABNORMAL HIGH (ref 70–99)

## 2018-04-16 LAB — CBC
HCT: 29.3 % — ABNORMAL LOW (ref 36.0–46.0)
Hemoglobin: 9 g/dL — ABNORMAL LOW (ref 12.0–15.0)
MCH: 33 pg (ref 26.0–34.0)
MCHC: 30.7 g/dL (ref 30.0–36.0)
MCV: 107.3 fL — AB (ref 80.0–100.0)
Platelets: 172 10*3/uL (ref 150–400)
RBC: 2.73 MIL/uL — ABNORMAL LOW (ref 3.87–5.11)
RDW: 14.6 % (ref 11.5–15.5)
WBC: 17.7 10*3/uL — ABNORMAL HIGH (ref 4.0–10.5)
nRBC: 0 % (ref 0.0–0.2)

## 2018-04-16 LAB — PHOSPHORUS: Phosphorus: 4.8 mg/dL — ABNORMAL HIGH (ref 2.5–4.6)

## 2018-04-16 LAB — MAGNESIUM: Magnesium: 2.5 mg/dL — ABNORMAL HIGH (ref 1.7–2.4)

## 2018-04-16 MED ORDER — FUROSEMIDE 10 MG/ML IJ SOLN
40.0000 mg | Freq: Once | INTRAMUSCULAR | Status: AC
  Administered 2018-04-16: 40 mg via INTRAVENOUS
  Filled 2018-04-16 (×2): qty 4

## 2018-04-16 MED ORDER — BISACODYL 10 MG RE SUPP
10.0000 mg | Freq: Once | RECTAL | Status: AC
  Administered 2018-04-16: 10 mg via RECTAL
  Filled 2018-04-16: qty 1

## 2018-04-16 NOTE — Progress Notes (Signed)
NAME:  [name] [name], MRN:  [id number], DOB:  1951-02-22, LOS: 5 ADMISSION DATE:  2018-04-28, CONSULTATION DATE:  04/13/18 REFERRING MD:  Danise Edge, CHIEF COMPLAINT:  Worsening Acute on Chronic Respiratory Failure   Brief History   68 year old female former  smoker ( 40 pack year smoking history quit 2002)  with COPD, Pulmonary HTN, and history of pneumonia x 4 in the last 12 months ( twice in the last month) presents 1/24, from [facility] with worsening respiratory distress x 3 days. She had been diagnosed with pneumonia 1/22.  Saturations were 68% on 2 L per EMS notes . She was admitted and treated with  Antibiotics x 1 day. She has become progressively hypoxic despite increased oxygen support and BiPAP since 1/25 .PCCM have been asked to evaluate patient for abnormal CT Chest and worsening hypoxemia.  Past Medical History  COPD, Pulmonary HTN, Hx of recurrent PNA, CHF, HTN  Significant Hospital Events   1/24 Admit 1/26 Transfer to ICU  Consults:  PCCM   Procedures:  1/26 ETT  1/27 Right IJ CVL  Significant Diagnostic Tests:   CT Chest 1/25 Diffuse progressive bilateral ground glass opacities compatible with ARDS  vs acute interstitial pneumonitis.Additionally, small bilateral pleural effusions , and mediastinal adenopathy reactive vs. Inflammation/ infection, metastatic adenopathy, or lymphoproliferative disorder. No significant bronchiectasis or peripheral interstitial reticulation. Question mild focal area of subpleural honeycombing within the posterolateral left upper lobe and anterior right upper lobe  Echo 04/12/2018 EF 60-65%, mild aortic stenosis and regurgitation,MV: mild to moderate regurgitation,LA with mild dialtion, RV with mild dilation,RA with mild dilation,Severe tricuspid regurgitation, PAPP 71 mm Hg  Micro Data:  1/24 RVP>> Negative 1/24 Blood Cultures>> No grown (final)  1/26>> Urine legionella (negative)  1/26>> Urine strep (negative)  1/27>> Resp cultures  pending   Antimicrobials:  1/26 Vanc >> dc'd 1/28 1/26 Cefepime >>    Interim history/subjective:  No events overnight. Sedated on vent.   Objective   Blood pressure (!) 108/50, pulse 87, temperature 98.8 F (37.1 C), temperature source Axillary, resp. rate 16, height 5\' 5"  (1.651 m), weight 82.1 kg, SpO2 97 %. CVP:  [9 mmHg-15 mmHg] 14 mmHg  Vent Mode: PRVC FiO2 (%):  [40 %-50 %] 50 % Set Rate:  [22 bmp-26 bmp] 26 bmp Vt Set:  [340 mL] 340 mL PEEP:  [8 cmH20] 8 cmH20 Plateau Pressure:  [17 cmH20-26 cmH20] 21 cmH20   Intake/Output Summary (Last 24 hours) at 04/16/2018 0724 Last data filed at 04/16/2018 0700 Gross per 24 hour  Intake 2831.51 ml  Output 355 ml  Net 2476.51 ml   Filed Weights   04/14/18 0400 04/15/18 0500 04/16/18 0500  Weight: 76.4 kg 79 kg 82.1 kg    Examination: General: Sedated on vent, NAD HENT: PERRL, right IJ CVL Lungs: Bilateral rhonchi improved, no wheezing  Cardiovascular: RRR, no systolic murmur Abdomen: soft, non distended, hypoactive BS Extremities: warm, bilateral edema Neuro: RASS -4  Resolved Hospital Problem list     Assessment & Plan:   Acute on Chronic Hypoxic and Hypercarbic Respiratory Failure: Concern for worsening viral vs bacterial PNA, possible ARDS. Autoimmune pneumonitis is also a consideration. Patient has expressed that she does not wish for long term intubation. Family is in agreement.  -- Full vent support (day 3), ARDS settings 6 cc/kg  -- Respiratory acidosis with hypercapnia this morning, RR already increased to 26 -- Repeat PM ABG -- Continue empiric cefepime (day 4) -- Continue Solumedrol 125 daily  --  Respiratory cultures pending  -- Fentanyl gtt, PRN versed for RASS goal 0 to -1   Septic Shock: Persistent leukocytosis but could be due to steroids, afebrile.  -- Continue levophed, to goal MAP > 65 -- Empiric cefepime  -- Follow cultures.   Oliguric AKI: S/p 1L LR yesterday without improvement. FeNa consistent  with Pre-renal etiology, however CVP is elevated 14. Hesitant to give further IVFs with diastolic CHF and current respiratory status. Appears volume overloaded on exam.  -- Discussed with family, dialysis is not in line with patient's goals of care.  -- Strict I&Os -- Daily labs   HFpEF: Diastolic HF exacerbation with pulmonary edema could be contributing to her respiratory failure, but diuresis is limited with hypotension. Now in oliguric renal failure.  -- Trend CVPs  -- I&Os -- Holding diuresis with hypotension   Goals of Care: DNR, plan for short term intubation. Extubate and transition to palliative if no improvement after 1 week.   Best practice:  Diet: NPO Pain/Anxiety/Delirium protocol (if indicated): Fentanyl gtt, intermittent versed VAP protocol (if indicated): ordered DVT prophylaxis: Lovenox  GI prophylaxis: Protonix  Glucose control: CBG checks Mobility: Bedrest  Code Status: DNR Family Communication:  Updated brother & sister in law at bedside 1/28 Disposition: Remain in ICU   Reymundo Pollarolyn Anushka Hartinger, M.D. - PGY3 Pager: [phone] 04/16/2018, 7:24 AM

## 2018-04-16 NOTE — Progress Notes (Signed)
Patient transported to CT and back to room 2M04 without complications.  

## 2018-04-16 NOTE — Progress Notes (Signed)
eLink Physician-Brief Progress Note Patient Name: [name] [name] DOB: June 13, 1950 MRN: [id number]   Date of Service  04/16/2018  HPI/Events of Note  Component of respiratory acidosis  eICU Interventions  Increase respiratory rate on the vent to 26 breaths per minute        [name] U Alonzo Owczarzak 04/16/2018, 5:04 AM

## 2018-04-17 DIAGNOSIS — J849 Interstitial pulmonary disease, unspecified: Secondary | ICD-10-CM

## 2018-04-17 LAB — POCT I-STAT 7, (LYTES, BLD GAS, ICA,H+H)
Acid-base deficit: 3 mmol/L — ABNORMAL HIGH (ref 0.0–2.0)
Bicarbonate: 24.3 mmol/L (ref 20.0–28.0)
Calcium, Ion: 1.42 mmol/L — ABNORMAL HIGH (ref 1.15–1.40)
HCT: 26 % — ABNORMAL LOW (ref 36.0–46.0)
Hemoglobin: 8.8 g/dL — ABNORMAL LOW (ref 12.0–15.0)
O2 Saturation: 91 %
Potassium: 4.6 mmol/L (ref 3.5–5.1)
Sodium: 144 mmol/L (ref 135–145)
TCO2: 26 mmol/L (ref 22–32)
pCO2 arterial: 56.9 mmHg — ABNORMAL HIGH (ref 32.0–48.0)
pH, Arterial: 7.239 — ABNORMAL LOW (ref 7.350–7.450)
pO2, Arterial: 72 mmHg — ABNORMAL LOW (ref 83.0–108.0)

## 2018-04-17 LAB — BASIC METABOLIC PANEL
Anion gap: 6 (ref 5–15)
BUN: 81 mg/dL — ABNORMAL HIGH (ref 8–23)
CO2: 22 mmol/L (ref 22–32)
Calcium: 8.4 mg/dL — ABNORMAL LOW (ref 8.9–10.3)
Chloride: 114 mmol/L — ABNORMAL HIGH (ref 98–111)
Creatinine, Ser: 1.46 mg/dL — ABNORMAL HIGH (ref 0.44–1.00)
GFR calc Af Amer: 42 mL/min — ABNORMAL LOW (ref >60)
GFR calc non Af Amer: 36 mL/min — ABNORMAL LOW (ref >60)
Glucose, Bld: 135 mg/dL — ABNORMAL HIGH (ref 70–99)
Potassium: 4.4 mmol/L (ref 3.5–5.1)
Sodium: 142 mmol/L (ref 135–145)

## 2018-04-17 LAB — CBC
HCT: 29.2 % — ABNORMAL LOW (ref 36.0–46.0)
Hemoglobin: 8.8 g/dL — ABNORMAL LOW (ref 12.0–15.0)
MCH: 32 pg (ref 26.0–34.0)
MCHC: 30.1 g/dL (ref 30.0–36.0)
MCV: 106.2 fL — ABNORMAL HIGH (ref 80.0–100.0)
Platelets: 134 10*3/uL — ABNORMAL LOW (ref 150–400)
RBC: 2.75 MIL/uL — ABNORMAL LOW (ref 3.87–5.11)
RDW: 14.7 % (ref 11.5–15.5)
WBC: 21.2 10*3/uL — ABNORMAL HIGH (ref 4.0–10.5)
nRBC: 0 % (ref 0.0–0.2)

## 2018-04-17 LAB — GLUCOSE, CAPILLARY
Glucose-Capillary: 123 mg/dL — ABNORMAL HIGH (ref 70–99)
Glucose-Capillary: 130 mg/dL — ABNORMAL HIGH (ref 70–99)
Glucose-Capillary: 133 mg/dL — ABNORMAL HIGH (ref 70–99)
Glucose-Capillary: 168 mg/dL — ABNORMAL HIGH (ref 70–99)

## 2018-04-17 MED ORDER — MORPHINE 100MG IN NS 100ML (1MG/ML) PREMIX INFUSION
0.0000 mg/h | INTRAVENOUS | Status: DC
  Administered 2018-04-17: 5 mg/h via INTRAVENOUS
  Filled 2018-04-17: qty 100

## 2018-04-17 MED ORDER — GLYCOPYRROLATE 0.2 MG/ML IJ SOLN: 0.2000 mg | INTRAMUSCULAR | Status: DC | PRN

## 2018-04-17 MED ORDER — LORAZEPAM 2 MG/ML IJ SOLN: 1.0000 mg | INTRAMUSCULAR | Status: DC | PRN

## 2018-04-17 MED ORDER — DEXMEDETOMIDINE HCL IN NACL 400 MCG/100ML IV SOLN
0.4000 ug/kg/h | INTRAVENOUS | Status: DC
  Administered 2018-04-17: 0.4 ug/kg/h via INTRAVENOUS
  Administered 2018-04-17: 0.6 ug/kg/h via INTRAVENOUS
  Filled 2018-04-17 (×2): qty 100

## 2018-04-17 MED ORDER — MORPHINE BOLUS VIA INFUSION
5.0000 mg | INTRAVENOUS | Status: DC | PRN
  Filled 2018-04-17: qty 5

## 2018-04-17 MED ORDER — GLYCOPYRROLATE 1 MG PO TABS: 1.0000 mg | ORAL_TABLET | ORAL | Status: DC | PRN

## 2018-04-17 MED ORDER — DIPHENHYDRAMINE HCL 50 MG/ML IJ SOLN: 25.0000 mg | INTRAMUSCULAR | Status: DC | PRN

## 2018-04-17 MED ORDER — MORPHINE SULFATE (PF) 2 MG/ML IV SOLN
2.0000 mg | INTRAVENOUS | Status: DC | PRN
  Administered 2018-04-17: 2 mg via INTRAVENOUS

## 2018-04-17 MED ORDER — FUROSEMIDE 10 MG/ML IJ SOLN
40.0000 mg | Freq: Once | INTRAMUSCULAR | Status: AC
  Administered 2018-04-17: 40 mg via INTRAVENOUS
  Filled 2018-04-17: qty 4

## 2018-04-17 MED ORDER — POLYVINYL ALCOHOL 1.4 % OP SOLN
1.0000 [drp] | Freq: Four times a day (QID) | OPHTHALMIC | Status: DC | PRN
  Filled 2018-04-17: qty 15

## 2018-04-19 NOTE — Care Plan (Signed)
Discussion held with Rayann Heman, patient's HCPOA (documentation in chart). After updating on patient's medical condition, decision was made to transition patient to comfort care. Will contact patient's sons at the request of HCPOA.

## 2018-04-19 NOTE — Procedures (Signed)
Extubation Procedure Note  Patient Details:   Name: [name] [name] DOB: 1950/04/04 MRN: [id number]   Airway Documentation:    Vent end date: 03/29/2018 Vent end time: 1707   Evaluation  O2 sats: currently acceptable Complications: No apparent complications Patient did tolerate procedure well. Bilateral Breath Sounds: Diminished   No   Pt extubated to RA per Withdrawal of Life protocol   Carolan Shiver 03/25/2018, 5:09 PM

## 2018-04-19 NOTE — Progress Notes (Signed)
   2018-05-08 1734  Clinical Encounter Type  Visited With Patient and family together;Health care provider  Visit Type Patient actively dying  Referral From Nurse  Consult/Referral To Chaplain  Spiritual Encounters  Spiritual Needs Prayer  Stress Factors  Family Stress Factors Loss   Responded to a request from the PT's nurse per the son to come and pray with them.  Family surrounding the bed.  Chaplain provided words of comfort and support offering hospitality.  Chaplain encouraged the family to still talk to the PT and one another.  Chaplain offered prayer with the family.  Chaplain available for any further needs or additional support. Chaplain Agustin Cree

## 2018-04-19 NOTE — Progress Notes (Signed)
Patient time of death occurred at 20. Auscultated at bedside with [name] Mann RN. No breath/heart sounds auscultated. Son at bedside and other family notified per request. Lines d/c'd from pt. Transferred to [facility].  At bedside, 1 gown, 2 pants, 1 shirt and 1 undergarment, notified son and he stated that they could be thrown away "since they're just clothes".

## 2018-04-19 NOTE — Progress Notes (Signed)
Wasted 42 mg / 42 mL of Morphine AND 210 mL of Fentanyl in sink with Deborra Medina RN

## 2018-04-19 NOTE — Progress Notes (Signed)
Patient's sister in law and POA at bedside for comfort extubation. Chaplain has been called upon family request.

## 2018-04-19 NOTE — Discharge Summary (Addendum)
Discharge / Death Summary  Name: [name] [name] MRN: [id number] DOB: 1951-01-11 68 y.o.  Date of Admission: 03/30/2018 10:41 AM Date of Discharge: 04/18/2018 Attending Physician: Dr. Everardo All   Discharge Diagnosis: Principal Problem:   Acute on chronic respiratory failure with hypoxia (HCC) Active Problems:   CHF (congestive heart failure) (HCC)   HCAP (healthcare-associated pneumonia)  Cause of death: Acute on Chronic Hypoxic Respiratory Failure  Time of death: 19:18  Disposition and follow-up:   [name] [name] was discharged from [facility] in expired condition.    Hospital Course: [name] [name] is a 68 yo F with a pmhx of COPD on 2L oxygen chronically, unclear underlying chronic lung disease (? ILD vs. autoimmune pneumonitis), and diastolic heart failure who was admitted on 04/15/2018 from her rehab facility for acute on chronic hypoxic respiratory failure due to recurrent pneumonia. This was her third admission for pneumonia in the past two months. She was placed on BiPAP, started on broad spectrum antibiotics, and admitted to step down.  Unfortunately patient's respiratory status continued to decline with worsening hypoxia despite BiPAP. She was started on high dose steroids due to concern for possible ILD or autoimmune pneumonitis flair. Patient was previously DNR at her living facility, but stated that she would be ok with short term intubation only to see if she could over come this respiratory failure. She was intubated on 1/26 and transferred to the ICU with concern for ARDS. Course was complicated by encephalopathy, diastolic heart failure exacerbation, and oliguric renal failure. After 4 days of intubation, decision was made to withdrawal care by [facility] due to lack of clinical improvement. She was extubated on 1/30 and transitioned to comfort care at 17:07. Patient passed away shortly after, time of death 19:18.   Signed: Reymundo Poll, MD 04/18/2018, 11:38  AM

## 2018-04-19 NOTE — Progress Notes (Addendum)
NAME:  [name] [name], MRN:  [id number], DOB:  [birth year], LOS: 6 ADMISSION DATE:  04-12-18, CONSULTATION DATE:  04/13/18 REFERRING MD:  Danise Edge, CHIEF COMPLAINT:  Worsening Acute on Chronic Respiratory Failure   Brief History   68 year old female former  smoker ( 40 pack year smoking history quit 2002)  with COPD, Pulmonary HTN, and history of pneumonia x 4 in the last 12 months ( twice in the last month) presents 1/24, from [facility] with worsening respiratory distress x 3 days. She had been diagnosed with pneumonia 1/22.  Saturations were 68% on 2 L per EMS notes . She was admitted and treated with  Antibiotics x 1 day. She has become progressively hypoxic despite increased oxygen support and BiPAP since 1/25 .PCCM have been asked to evaluate patient for abnormal CT Chest and worsening hypoxemia.  Past Medical History  COPD, Pulmonary HTN, Hx of recurrent PNA, CHF, HTN  Significant Hospital Events   1/24 Admit 1/26 Transfer to ICU  Consults:  PCCM   Procedures:  1/26 ETT  1/27 Right IJ CVL  Significant Diagnostic Tests:   CT Chest 1/25 Diffuse progressive bilateral ground glass opacities compatible with ARDS  vs acute interstitial pneumonitis.Additionally, small bilateral pleural effusions , and mediastinal adenopathy reactive vs. Inflammation/ infection, metastatic adenopathy, or lymphoproliferative disorder. No significant bronchiectasis or peripheral interstitial reticulation. Question mild focal area of subpleural honeycombing within the posterolateral left upper lobe and anterior right upper lobe  Echo 04/12/2018 EF 60-65%, mild aortic stenosis and regurgitation,MV: mild to moderate regurgitation,LA with mild dialtion, RV with mild dilation,RA with mild dilation,Severe tricuspid regurgitation, PAPP 71 mm Hg  CT Head 1/29 No acute intracranial abnormality. Mild chronic microvascular ischemic disease for age.   Micro Data:  1/24 RVP>> Negative 1/24 Blood Cultures>> No grown  (final)  1/26>> Urine legionella (negative)  1/26>> Urine strep (negative)  1/27>> Resp cultures with normal flora  Antimicrobials:  1/26 Vanc >> dc'd 1/28 1/26 Cefepime >>    Interim history/subjective:  No events overnight. Sedated on vent. Continues to have breath stacking and vent dyssynchrony when sedation is weaned.   Objective   Blood pressure (!) 106/52, pulse 84, temperature 97.7 F (36.5 C), temperature source Oral, resp. rate (!) 30, height 5\' 5"  (1.651 m), weight 82.3 kg, SpO2 96 %. CVP:  [7 mmHg-15 mmHg] 7 mmHg  Vent Mode: PRVC FiO2 (%):  [40 %-50 %] 50 % Set Rate:  [26 bmp] 26 bmp Vt Set:  [340 mL] 340 mL PEEP:  [8 cmH20] 8 cmH20 Plateau Pressure:  [10 cmH20-24 cmH20] 19 cmH20   Intake/Output Summary (Last 24 hours) at 04/07/2018 0649 Last data filed at 03/31/2018 0600 Gross per 24 hour  Intake 2655.34 ml  Output 1275 ml  Net 1380.34 ml   Filed Weights   04/15/18 0500 04/16/18 0500 03/26/2018 0500  Weight: 79 kg 82.1 kg 82.3 kg    Examination: General: Sedated on vent, NAD HENT: PERRL, right IJ CVL Lungs: Bilateral rhonchi improved, no wheezing  Cardiovascular: RRR, no systolic murmur Abdomen: soft, non distended, hypoactive BS Extremities: warm, bilateral edema Neuro: RASS -4  Resolved Hospital Problem list     Assessment & Plan:   Acute on Chronic Hypoxic and Hypercarbic Respiratory Failure: Concern for worsening viral vs bacterial PNA, possible ARDS. Underlying chronic lung disease either ILD/pneumonitis. Patient has expressed that she does not wish for long term intubation. Family is in agreement.  -- Full vent support (day 4), ARDS settings 6 cc/kg  --  Repeat ABG -- Continue empiric cefepime (day 7/7) -- Continue Solumedrol 125 daily  -- Respiratory cultures pending  -- Fentanyl gtt, PRN versed for RASS goal 0 to -1 -- Add Precedex for vent synchrony   Septic Shock: Persistent leukocytosis, afebrile.  -- Continue levophed, to goal MAP >  65 -- Empiric cefepime (day 7/7) -- Follow cultures.   AKI: Volume status difficult to judge; no improvement with fluid challenge, became oliguric, now improving with diuresis. 1.2L UOP, still net positive 1L over the past 24 hours. CVP down to 7 this morning. Still requiring low dose pressors. -- Strict I&Os -- Lasix 40 x 1 again today  -- Daily labs   HFpEF: Diastolic HF exacerbation with pulmonary edema could be contributing to her respiratory failure, but diuresis is limited with hypotension. Now in oliguric renal failure.  -- Trend CVPs  -- I&Os -- Diuresis above   Goals of Care: DNR, plan for short term intubation only. Ok with CRRT if needed, but long term dialysis is not in line with goals of care.   Best practice:  Diet: Tube feeds Pain/Anxiety/Delirium protocol (if indicated): Fentanyl gtt, intermittent versed VAP protocol (if indicated): ordered DVT prophylaxis: Lovenox  GI prophylaxis: Protonix  Glucose control: CBG checks Mobility: Bedrest  Code Status: DNR Family Communication:  Updated sister in law (HCPOA) at bedside 1/29 Disposition: Remain in ICU   Reymundo Poll, M.D. - PGY3 Pager: [phone] 11-May-2018, 6:49 AM

## 2018-04-19 DEATH — deceased

## 2018-04-21 ENCOUNTER — Telehealth: Payer: Self-pay | Admitting: *Deleted

## 2018-04-21 NOTE — Telephone Encounter (Signed)
Received D/C from [employer],D/C forwarded to Dr.Ellison to be signed.

## 2018-04-23 NOTE — Telephone Encounter (Signed)
Received Signed D/C-[facility] Notified for Pick up as Requested. -Faxed a copy to funeral home.

## 2020-01-17 IMAGING — CT CT HEAD W/O CM
3 series · 15 of 47 positions shown, 18 images · non-contrast
Comparison: Prior CT from 08/16/2011

CLINICAL DATA: Initial evaluation for acute altered mental status,
unclear cause, not responding.

EXAM:
CT HEAD WITHOUT CONTRAST
TECHNIQUE: Contiguous axial images were obtained from the base of the skull
through the vertex without intravenous contrast.

[Series 3: head 5.0 h30s · axial · 0.39mm/px · z∈[-69,+66]mm · 9 of 33 slices shown, 12 images]
[im 3/33  brain]
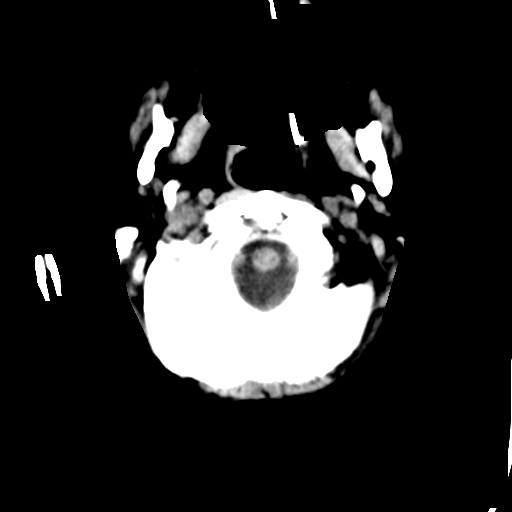
[im 3/33  bone]
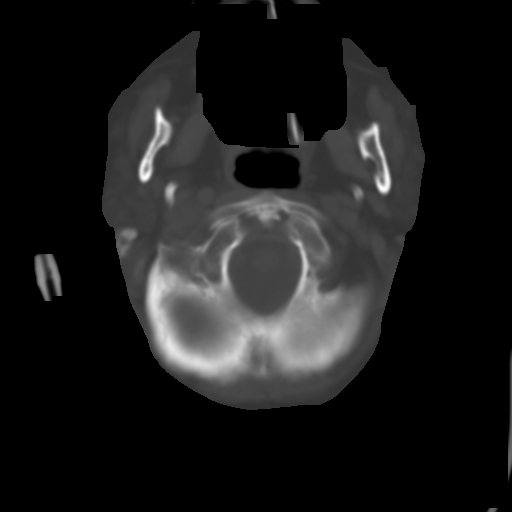
[im 6/33  brain]
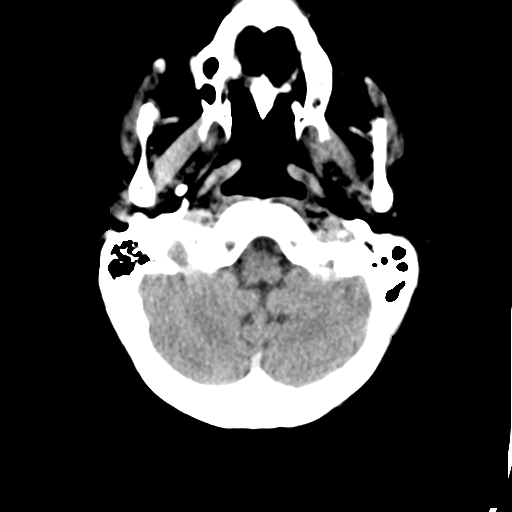
[im 9/33  brain]
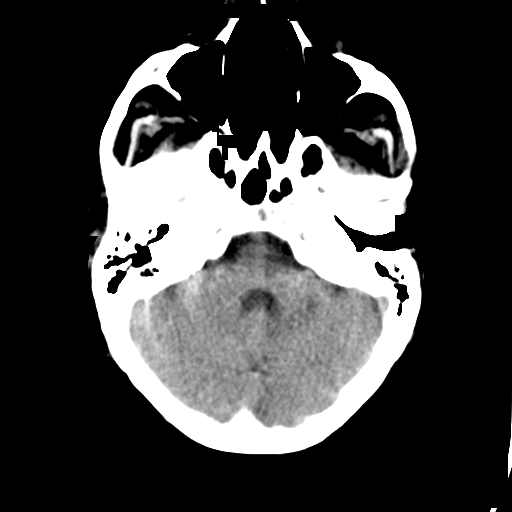
[im 13/33  brain]
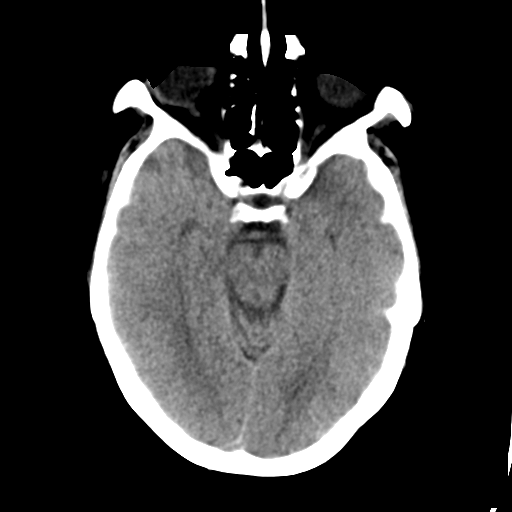
[im 17/33  brain]
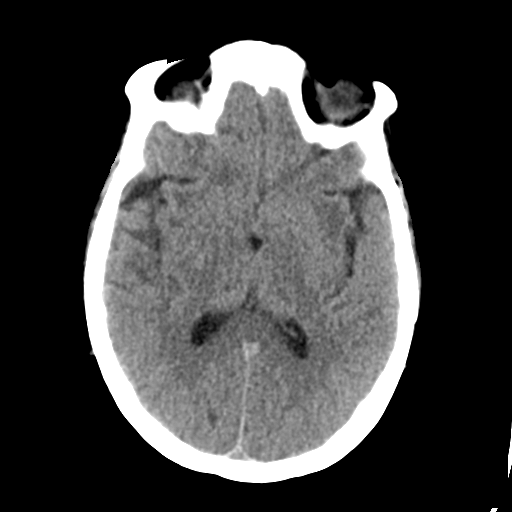
[im 17/33  bone]
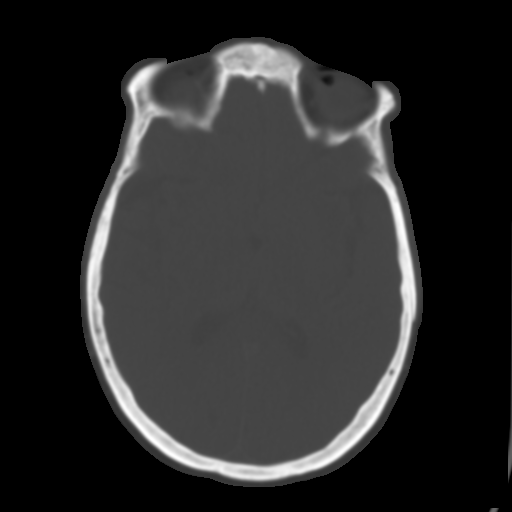
[im 20/33  brain]
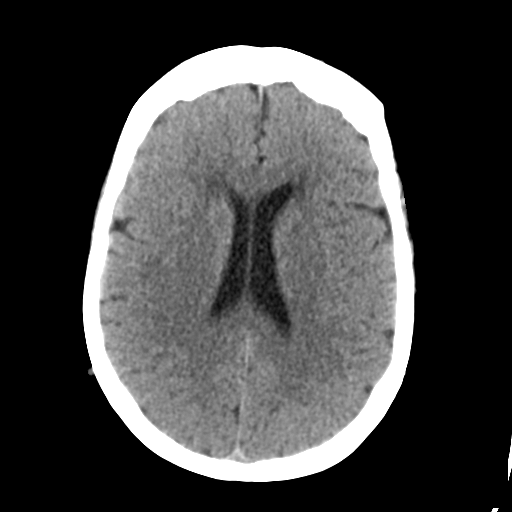
[im 24/33  brain]
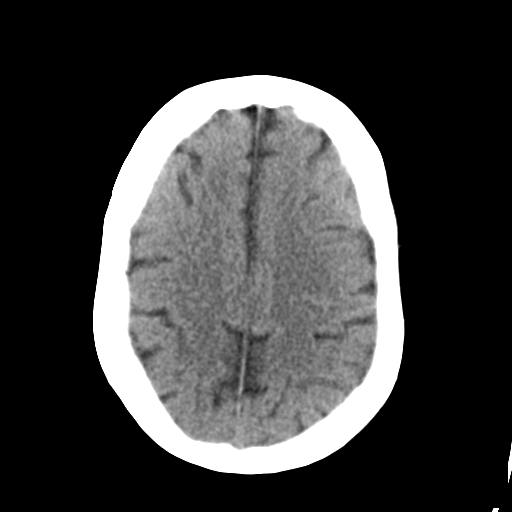
[im 27/33  brain]
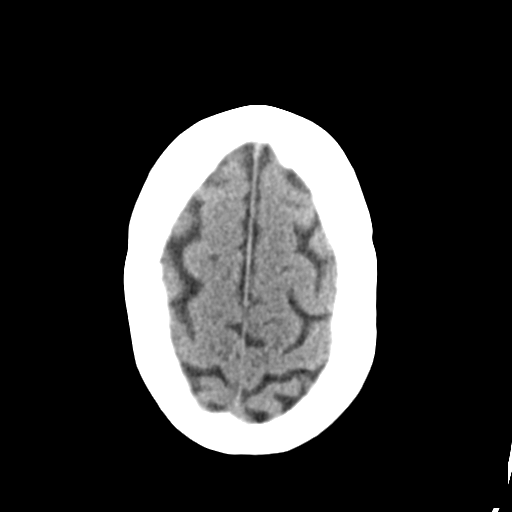
[im 30/33  brain]
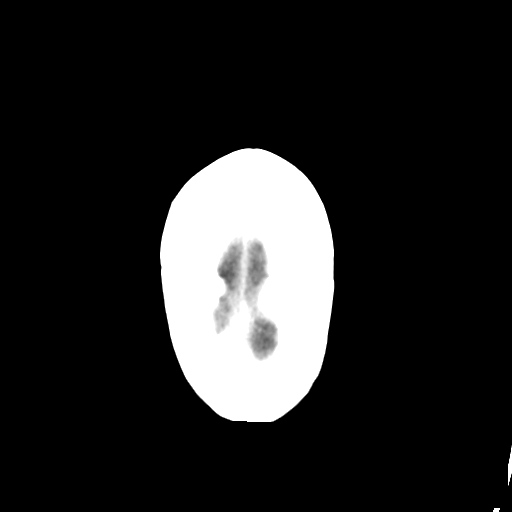
[im 30/33  bone]
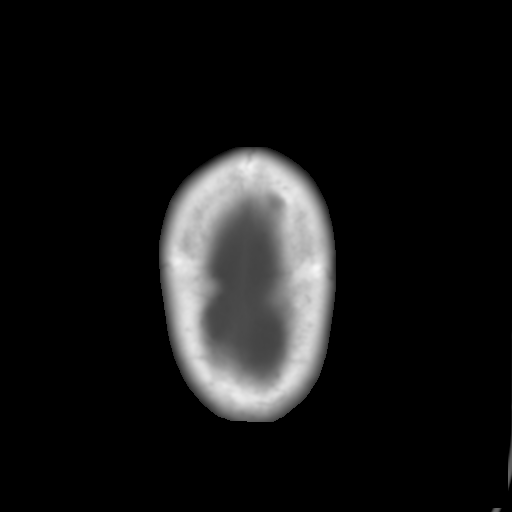

[Series 5: head 3.0 mpr cor · coronal · 0.29mm/px · 3 of 59 slices shown]
[im 20/59  brain]
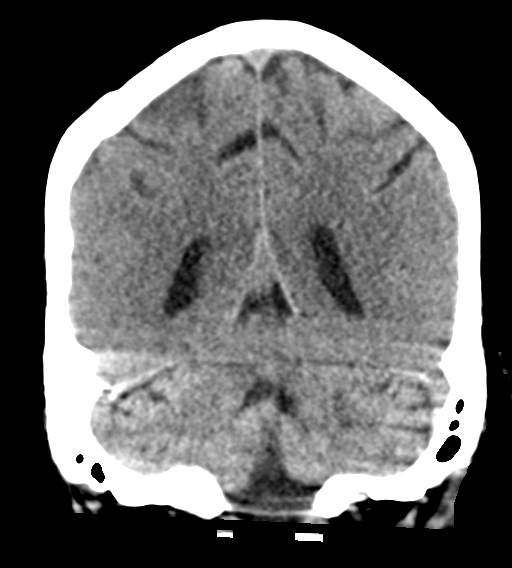
[im 26/59  brain]
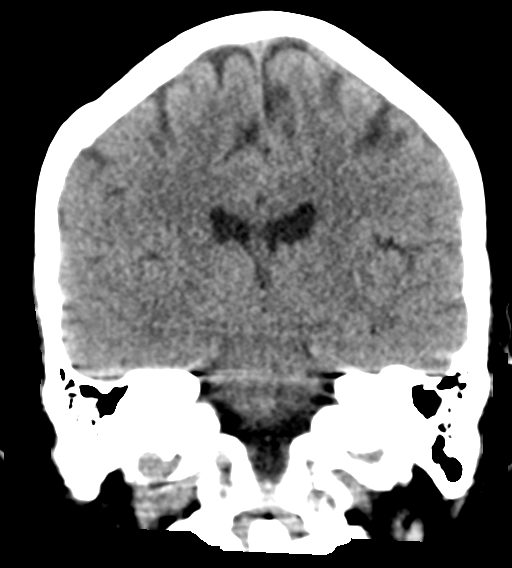
[im 33/59  brain]
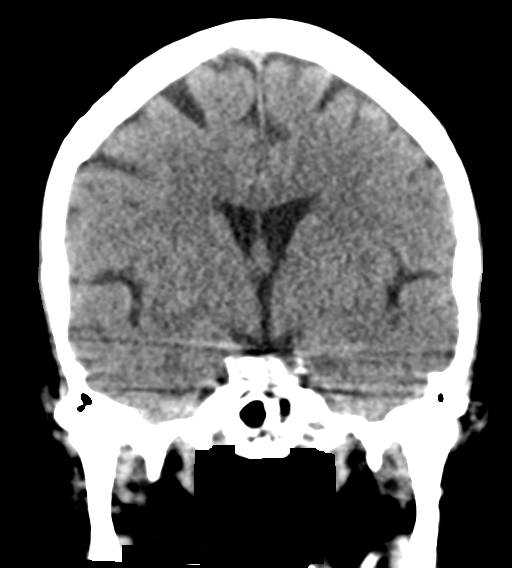

[Series 6: head 3.0 mpr sag · sagittal · 0.32mm/px · 3 of 46 slices shown]
[im 16/46  brain]
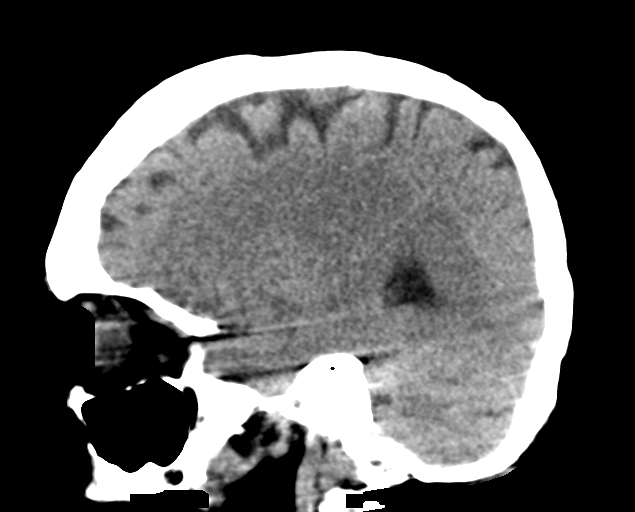
[im 23/46  brain]
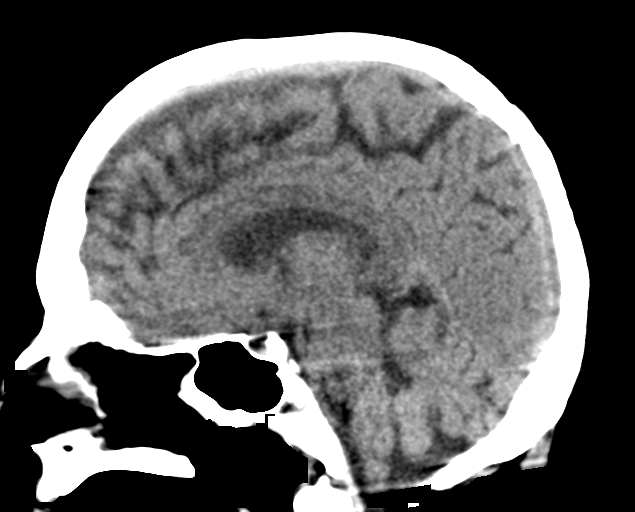
[im 31/46  brain]
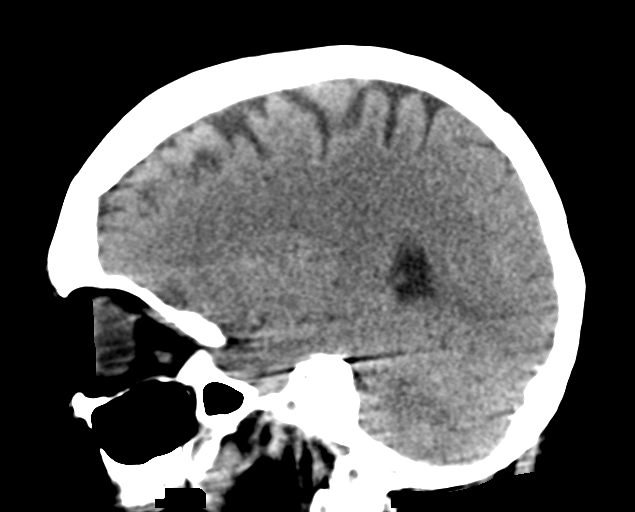

[15 of 47 positions shown; findings below may reference images not displayed]

FINDINGS: Brain: Age-appropriate cerebral atrophy with mild chronic small
vessel ischemic disease. No acute intracranial hemorrhage. No acute
large vessel territory infarct. No mass lesion, midline shift or
mass effect. No hydrocephalus. No extra-axial fluid collection.

Vascular: No hyperdense vessel.

Skull: Scalp soft tissues and calvarium within normal limits.

Sinuses/Orbits: Globes and orbital soft tissues normal. Paranasal
sinuses are largely clear. Endotracheal and enteric tubes partially
visualized. Small bilateral mastoid effusions noted.

Other: None.
IMPRESSION: 1. No acute intracranial abnormality.
2. Mild chronic microvascular ischemic disease for age.
